# Patient Record
Sex: Female | Born: 1985 | Race: Black or African American | Hispanic: No | Marital: Single | Smoking: Never smoker
Health system: Southern US, Community
[De-identification: ages and names within clinical notes are randomized; demographics above are authoritative.]

## PROBLEM LIST (undated history)

## (undated) ENCOUNTER — Inpatient Hospital Stay (HOSPITAL_COMMUNITY): Payer: Self-pay

## (undated) DIAGNOSIS — K429 Umbilical hernia without obstruction or gangrene: Secondary | ICD-10-CM

## (undated) DIAGNOSIS — E119 Type 2 diabetes mellitus without complications: Secondary | ICD-10-CM

## (undated) DIAGNOSIS — B9689 Other specified bacterial agents as the cause of diseases classified elsewhere: Secondary | ICD-10-CM

## (undated) DIAGNOSIS — B379 Candidiasis, unspecified: Secondary | ICD-10-CM

## (undated) DIAGNOSIS — O24419 Gestational diabetes mellitus in pregnancy, unspecified control: Secondary | ICD-10-CM

## (undated) DIAGNOSIS — O1490 Unspecified pre-eclampsia, unspecified trimester: Secondary | ICD-10-CM

## (undated) DIAGNOSIS — N760 Acute vaginitis: Secondary | ICD-10-CM

## (undated) HISTORY — DX: Umbilical hernia without obstruction or gangrene: K42.9

## (undated) HISTORY — PX: TUBAL LIGATION: SHX77

## (undated) HISTORY — DX: Unspecified pre-eclampsia, unspecified trimester: O14.90

---

## 2011-03-19 ENCOUNTER — Inpatient Hospital Stay (HOSPITAL_COMMUNITY)
Admission: AD | Admit: 2011-03-19 | Discharge: 2011-03-19 | Disposition: A | Discharge status: Home / Self Care | Payer: Medicaid Other | Source: Ambulatory Visit | Attending: Obstetrics | Admitting: Obstetrics

## 2011-03-19 DIAGNOSIS — O368190 Decreased fetal movements, unspecified trimester, not applicable or unspecified: Secondary | ICD-10-CM | POA: Insufficient documentation

## 2011-03-20 ENCOUNTER — Inpatient Hospital Stay (HOSPITAL_COMMUNITY)
Admission: AD | Admit: 2011-03-20 | Discharge: 2011-03-23 | DRG: 775 | Disposition: A | Discharge status: Home / Self Care | Payer: Medicaid Other | Source: Ambulatory Visit | Attending: Obstetrics | Admitting: Obstetrics

## 2011-03-20 LAB — CBC
HCT: 39.1 % (ref 36.0–46.0)
MCH: 27.7 pg (ref 26.0–34.0)
MCHC: 34.5 g/dL (ref 30.0–36.0)
MCV: 80.1 fL (ref 78.0–100.0)
Platelets: 250 10*3/uL (ref 150–400)
RDW: 13.7 % (ref 11.5–15.5)

## 2011-03-21 ENCOUNTER — Inpatient Hospital Stay (HOSPITAL_COMMUNITY): Admission: AD | Admit: 2011-03-21 | Payer: Self-pay | Admitting: Obstetrics and Gynecology

## 2011-03-21 NOTE — H&P (Signed)
  NAME:  Marcia Bryant, Marcia Bryant        ACCOUNT NO.:  [id number]  MEDICAL RECORD NO.:  [id number]           PATIENT TYPE:  I  LOCATION:  9167                          FACILITY:  [facility]  PHYSICIAN:  Roseanna Rainbow, M.D.DATE OF BIRTH:  09-21-1986  DATE OF ADMISSION:  03/20/2011 DATE OF DISCHARGE:                             HISTORY & PHYSICAL   CHIEF COMPLAINTS:  The patient is a 25 year old para 0 with an estimated date of confinement by ultrasound of Mar 30, 2011, complaining of spontaneous rupture of membranes.  HISTORY OF PRESENT ILLNESS:  The patient reports rupture of membranes for clear fluid.  She reports mild uterine contractions.  SOCIAL HISTORY:  She is married.  She denies any tobacco, ethanol, or drug use.  She works as a Financial controller.  ALLERGIES:  No known drug allergies.  PAST GYN HISTORY:  [facility] 13, 60 and 4.  PAST MEDICAL HISTORY:  She denies.  PAST SURGICAL HISTORY:  She denies.  FAMILY HISTORY:  Noncontributory.  ANTEPARTUM COURSE: Onset of care at [facility] transferred care to Dr. Gaynell Face at 20 weeks. Prenatal labs: hemoglobin 13, hematocrit 38.9, platelets 249,000, blood type O+, antibody screen negative.  Sickle cell trait negative.  RPR nonreactive, rubella immune.  Hepatitis B surface antigen negative, HIV nonreactive.  Pap test negative.  GC probe negative.  Chlamydia probe negative.  Quad screen negative.  1-hour GTT 109, GBS negative on April 9.  Ultrasound on December 19, 2010, at 24 weeks 4 days no previa.  REVIEW OF SYSTEMS:  GU:  Please see the above.  PHYSICAL EXAMINATION:  VITAL SIGNS:  Stable, afebrile.  Fetal heart tracing baseline 140, moderate long-term variability.  Tocodynamometer irregular uterine contractions.  Sterile vaginal exam per the RN.  ASSESSMENT:  Nullipara at term, spontaneous rupture of membranes. Category one fetal heart tracing.  GBS negative.  No signs or symptoms of chorioamnionitis.  PLAN:  Admission.   Augmentation of labor with oral Cytotec.  Monitor progress.  Anticipate spontaneous vaginal delivery.  Monitor for any signs or symptoms of chorioamnionitis.     Roseanna Rainbow, M.D.     Judee Clara  D:  03/20/2011  T:  03/21/2011  Job:  [id number]  cc:   Kathreen Cosier, M.D. Fax: [phone]  Electronically Signed by Antionette Char M.D. on 03/21/2011 12:26:08 PM

## 2011-03-22 LAB — CBC
MCH: 27 pg (ref 26.0–34.0)
MCHC: 33.5 g/dL (ref 30.0–36.0)
MCV: 80.6 fL (ref 78.0–100.0)
Platelets: 203 10*3/uL (ref 150–400)
RBC: 4.07 MIL/uL (ref 3.87–5.11)
RDW: 14 % (ref 11.5–15.5)

## 2012-02-09 ENCOUNTER — Inpatient Hospital Stay (HOSPITAL_COMMUNITY): Payer: PRIVATE HEALTH INSURANCE

## 2012-02-09 ENCOUNTER — Encounter (HOSPITAL_COMMUNITY): Payer: Self-pay | Admitting: *Deleted

## 2012-02-09 ENCOUNTER — Inpatient Hospital Stay (HOSPITAL_COMMUNITY)
Admission: AD | Admit: 2012-02-09 | Discharge: 2012-02-09 | Disposition: A | Discharge status: Home / Self Care | Payer: PRIVATE HEALTH INSURANCE | Attending: Obstetrics & Gynecology | Admitting: Obstetrics & Gynecology

## 2012-02-09 DIAGNOSIS — O209 Hemorrhage in early pregnancy, unspecified: Secondary | ICD-10-CM

## 2012-02-09 DIAGNOSIS — O200 Threatened abortion: Secondary | ICD-10-CM | POA: Insufficient documentation

## 2012-02-09 LAB — WET PREP, GENITAL: Yeast Wet Prep HPF POC: NONE SEEN

## 2012-02-09 LAB — HCG, QUANTITATIVE, PREGNANCY: hCG, Beta Chain, Quant, S: 249 m[IU]/mL — ABNORMAL HIGH (ref <5)

## 2012-02-09 NOTE — MAU Provider Note (Signed)
  History     CSN: [id number]  Arrival date and time: 02/09/12 0335   First Provider Initiated Contact with Patient 02/09/12 0436      No chief complaint on file.  HPI This is a 26 y.o. at Unknown GA who presents with onset of bleeding this evening, which worsened later tonight. Denies cramping. States had a Quant HCG done at other clinic on 01/28/12 which was 107.  LMP in February. Has long history of missing periods.   OB History    Grav Para Term Preterm Abortions TAB SAB Ect Mult Living   2 1 1       1       History reviewed. No pertinent past medical history.  History reviewed. No pertinent past surgical history.  Family History  Problem Relation Age of Onset  . Hypertension Father     History  Substance Use Topics  . Smoking status: Never Smoker   . Smokeless tobacco: Not on file  . Alcohol Use: No    Allergies: No Known Allergies  No prescriptions prior to admission    ROS As described above  Physical Exam   Blood pressure 109/59, pulse 62, temperature 98.9 F (37.2 C), temperature source Oral, resp. rate 20, height 5\' 5"  (1.651 m), weight 183 lb (83.008 kg), last menstrual period 12/15/2011.  Physical Exam  Constitutional: She appears well-developed and well-nourished. No distress.  HENT:  Head: Normocephalic.  Cardiovascular: Normal rate.   Respiratory: Effort normal.  GI: Soft. She exhibits no distension and no mass. There is no tenderness. There is no rebound and no guarding.  Genitourinary: Uterus normal. Vaginal discharge (Moderate dark red blood in vault. Cervix long and closed) found.       Uterus small, nontender. Adnexae nontender  Musculoskeletal: Normal range of motion.  Neurological: She is alert.  Skin: Skin is warm and dry.  Psychiatric: She has a normal mood and affect.   Blood Type O+  MAU Course  Procedures  MDM Will repeat Quant HCG.  Would expect it to be about 6400 by now, based on doubling every 48 hrs, or 2233 by the 66%  method.  >>>  Quant = 249  (was 107 on 01/28/12)  WU:[id number] gestational sac: Endometrial stripe thickness is  moderately prominent at 1.1 cm. Small cystic collection in the mid  endometrial region could represent early intrauterine pregnancy.  Small subserosal leiomyoma measuring 0.8 x 0.6 x 0.9 cm in the  anterior upper uterus.  Yolk sac: Not visualized  Embryo: Not visualized  Cardiac Activity: Not visualized  Heart Rate: N/A bpm  MSD: 4.5 mm 4 w 3 d  Maternal uterus/adnexae:  The right ovary contains a simple appearing cyst measuring 5 x 3.4  x 3.9 cm no flow or nodularity is demonstrated. This may represent  a corpus luteal cyst or functional cyst. Left ovary is  unremarkable. No free pelvic fluid collections.  IMPRESSION:  Small nonspecific cystic collection in the endometrium may  represent early intrauterine gestational sac, measuring about 4  weeks 3 days gestational age. Differential diagnosis includes  blood clot from missed spontaneous abortion. Recommend follow-up  with quantitative beta HCG levels and short-term ultrasound in 1-2  weeks.  Assessment and Plan  A:  Probable SAB in progress P:  Return in 48 hrs to repeat Quant, then may need to go to once weekly      Bleeding Precautions  [facility] 02/09/2012, 4:40 AM

## 2012-02-09 NOTE — MAU Provider Note (Signed)
Attestation of Attending Supervision of Advanced Practitioner: Evaluation and management procedures were performed by the PA/NP/CNM/OB Fellow under my supervision/collaboration. Chart reviewed, and agree with management and plan.  Jaynie Collins, M.D. 02/09/2012 8:46 AM

## 2012-02-09 NOTE — MAU Note (Signed)
D/C HOME WITH SMALL AMT BLEEDING- TO RETURN ON Thursday FOR LABS

## 2012-02-09 NOTE — MAU Note (Signed)
PT SAYS [facility] HAD NL CYCLE IN FEB.   NO CYCLE IN [facility]  OR April.     NO BIRTH CONTROL.   DID HOME PREG TEST  POSTIVE  3-25.   WENT TO URGENT CARE ON [place] ON 3-21-    HAS PAPERS IN TRIAGE-  QUANT WAS  107.Marland Kitchen   SAYS VAG BLEEDING  STARTED Monday NIGHT  AT   8PM- SPOTTING.  THEN AT [place]- WAS MORE RED BLOOD.   HAS ON MIMI -PAD IN TRIAGE - FEELS LIKE SMALL AMT ON PAD .   DENIES PAIN NOR CRAMPING.    LAST SEX-  Friday..  DR MARSHALL DELIVERED LAST BABY-   PLANS TO GET PNC THERE THIS TIME.

## 2012-02-09 NOTE — Discharge Instructions (Signed)
Vaginal Bleeding During Pregnancy, First Trimester  A small amount of bleeding (spotting) is relatively common in early pregnancy. It usually stops on its own. There are many causes for bleeding or spotting in early pregnancy. Some bleeding may be related to the pregnancy and some may not. Cramping with the bleeding is more serious and concerning. Tell your caregiver if you have any vaginal bleeding.   CAUSES    It is normal in most cases.   The pregnancy ends (miscarriage).   The pregnancy may end (threatened miscarriage).   Infection or inflammation of the cervix.   Growths (polyps) on the cervix.   Pregnancy happens outside of the uterus and in a fallopian tube (tubal pregnancy).   Many tiny cysts in the uterus instead of pregnancy tissue (molar pregnancy).  SYMPTOMS   Vaginal bleeding or spotting with or without cramps.  DIAGNOSIS   To evaluate the pregnancy, your caregiver may:   Do a pelvic exam.   Take blood tests.   Do an ultrasound.  It is very important to follow your caregiver's instructions.   TREATMENT    Evaluation of the pregnancy with blood tests and ultrasound.   Bed rest (getting up to use the bathroom only).   Rho-gam immunization if the mother is Rh negative and the father is Rh positive.  HOME CARE INSTRUCTIONS    If your caregiver orders bed rest, you may need to make arrangements for the care of other children and for other responsibilities. However, your caregiver may allow you to continue light activity.   Keep track of the number of pads you use each day, how often you change pads and how soaked (saturated) they are. Write this down.   Do not use tampons. Do not douche.   Do not have sexual intercourse or orgasms until approved by your physician.   Save any tissue that you pass for your caregiver to see.   Take medicine for cramps only with your caregiver's permission.   Do not take aspirin because it can make you bleed.  SEEK IMMEDIATE MEDICAL CARE IF:    You  experience severe cramps in your stomach, back or belly (abdomen).   You have an oral temperature above 102 F (38.9 C), not controlled by medicine.   You pass large clots or tissue.   Your bleeding increases or you become light-headed, weak or have fainting episodes.   You develop chills.   You are leaking or have a gush of fluid from your vagina.   You pass out while having a bowel movement. That may mean you have a ruptured tubal pregnancy.  Document Released: 08/05/2005 Document Revised: 10/15/2011 Document Reviewed: 02/14/2009  Exit[employer] Patient Information 2012 [employer].

## 2012-02-09 NOTE — MAU Note (Signed)
PAD ON IN ROOM-  RED SMEARS.

## 2012-02-11 ENCOUNTER — Inpatient Hospital Stay (HOSPITAL_COMMUNITY)
Admission: AD | Admit: 2012-02-11 | Discharge: 2012-02-11 | Disposition: A | Discharge status: Home / Self Care | Payer: Medicaid Other | Source: Ambulatory Visit | Attending: Obstetrics & Gynecology | Admitting: Obstetrics & Gynecology

## 2012-02-11 ENCOUNTER — Encounter (HOSPITAL_COMMUNITY): Payer: Self-pay

## 2012-02-11 DIAGNOSIS — O039 Complete or unspecified spontaneous abortion without complication: Secondary | ICD-10-CM | POA: Insufficient documentation

## 2012-02-11 LAB — HCG, QUANTITATIVE, PREGNANCY: hCG, Beta Chain, Quant, S: 29 m[IU]/mL — ABNORMAL HIGH (ref <5)

## 2012-02-11 NOTE — MAU Note (Signed)
Patient to MAU for f/u BHCG. Denies any pain, has a little spotting.

## 2012-02-11 NOTE — MAU Provider Note (Signed)
Marcia Bryant is a 26 y.o. female who presents to MAU for follow up Bhcg. Was was evaluated 2 days ago with bleeding and cramping and the Bhcg was 249 and ultrasound showed a IUGS that measured 4.[redacted] weeks gestation with no YS or FP. Patient has little bleeding and cramping today. Bhcg today has dropped to 29. Will have patient follow up in GYN Clinic in 2 weeks. She will return here as needed for any problems.   Results for orders placed during the hospital encounter of 02/11/12 (from the past 24 hour(s))  HCG, QUANTITATIVE, PREGNANCY     Status: Abnormal   Collection Time   02/11/12  4:54 PM      Component Value Range   hCG, Beta Chain, Quant, S 29 (*) <5 (mIU/mL)

## 2012-02-11 NOTE — Discharge Instructions (Signed)
Your pregnancy hormone level today has dropped to 29 from 249 two days ago. You will need to follow up in the GYN Clinic in 2 weeks. Someone will call you to schedule an appointment. If you need to call them the number is [phone].   Miscarriage An early pregnancy loss or spontaneous abortion (miscarriage) is a common problem. This usually happens when the pregnancy is not developing normally. It is very unlikely that you or your partner did anything to cause this, although cigarette smoking, a sexually transmitted disease, excessive alcohol use, or drug abuse can increase the risk. Other causes are:  Abnormalities of the uterus.   Hormone or medical problems.   Trauma or genetic (chromosome) problems.  Having a miscarriage does not change your chances of having a normal pregnancy in the future. Your caregiver will advise you when it is safe to try to get pregnant again. AFTER A MISCARRIAGE  A miscarriage is inevitable when there is continual, heavy vaginal bleeding; cramping; dilation of the cervix; or passing of any pregnancy tissue. Bleeding and cramping will usually continue until all the tissue has been removed from the womb (uterus).   Often the uterus does not clean itself out completely. A medication or a D&C procedure is needed to loosen or remove the pregnancy tissue from the uterus. A D&C scrapes or suctions the tissue out.   If you are RH negative, you may need to have Rh immune globulin to avoid Rh problems.   You may be given medication to fight an infection if the miscarriage was due to an infection.  HOME CARE INSTRUCTIONS   You should rest in bed for the next 2 to 3 days.   Do not take tub baths or put anything in your vagina, including tampons or a douche.   Do not have sex until your caregiver approves.   Avoid exercise or heavy activities until directed by your caregiver.   Save any vaginal discharge that looks like tissue. Ask your caregiver if he or she wants to  inspect the discharge.   If you and your partner are having problems with guilt or grieving, talk to your caregiver or get counseling to help you understand and cope with your pregnancy loss.   Allow enough time to grieve before trying to get pregnant again.  SEEK IMMEDIATE MEDICAL CARE IF:   You have persistent heavy bleeding or a bad smelling vaginal discharge.   You have continued abdominal or pelvic pain.   You have an oral temperature above 102 F (38.9 C), not controlled by medicine.   You have severe weakness, fainting, or keep throwing up (vomiting).   You develop chills.   You are experiencing domestic violence.  MAKE SURE YOU:   Understand these instructions.   Will watch your condition.   Will get help right away if you are not doing well or get worse.  Document Released: 12/03/2004 Document Revised: 10/15/2011 Document Reviewed: 10/18/2008 [facility] Patient Information 2012 [place], [place].

## 2012-02-20 ENCOUNTER — Inpatient Hospital Stay (HOSPITAL_COMMUNITY)
Admission: AD | Admit: 2012-02-20 | Discharge: 2012-02-20 | Disposition: A | Discharge status: Home / Self Care | Payer: PRIVATE HEALTH INSURANCE | Source: Ambulatory Visit | Attending: Obstetrics & Gynecology | Admitting: Obstetrics & Gynecology

## 2012-02-20 ENCOUNTER — Encounter (HOSPITAL_COMMUNITY): Payer: Self-pay

## 2012-02-20 DIAGNOSIS — R51 Headache: Secondary | ICD-10-CM | POA: Insufficient documentation

## 2012-02-20 DIAGNOSIS — B349 Viral infection, unspecified: Secondary | ICD-10-CM

## 2012-02-20 DIAGNOSIS — B9789 Other viral agents as the cause of diseases classified elsewhere: Secondary | ICD-10-CM | POA: Insufficient documentation

## 2012-02-20 LAB — URINALYSIS, ROUTINE W REFLEX MICROSCOPIC
Protein, ur: NEGATIVE mg/dL
Specific Gravity, Urine: <1.005 — ABNORMAL LOW (ref 1.005–1.030)
Urobilinogen, UA: 2 mg/dL — ABNORMAL HIGH (ref 0.0–1.0)

## 2012-02-20 LAB — DIFFERENTIAL
Basophils Relative: 0 % (ref 0–1)
Eosinophils Absolute: 0 10*3/uL (ref 0.0–0.7)
Lymphs Abs: 2.5 10*3/uL (ref 0.7–4.0)
Neutro Abs: 16.6 10*3/uL — ABNORMAL HIGH (ref 1.7–7.7)
Neutrophils Relative %: 78 % — ABNORMAL HIGH (ref 43–77)

## 2012-02-20 LAB — CBC
MCH: 26.6 pg (ref 26.0–34.0)
MCHC: 33.5 g/dL (ref 30.0–36.0)
Platelets: 265 10*3/uL (ref 150–400)
RBC: 4.93 MIL/uL (ref 3.87–5.11)

## 2012-02-20 LAB — URINE MICROSCOPIC-ADD ON

## 2012-02-20 LAB — HCG, QUANTITATIVE, PREGNANCY: hCG, Beta Chain, Quant, S: <1 m[IU]/mL (ref <5)

## 2012-02-20 MED ORDER — ACETAMINOPHEN 500 MG PO TABS
1000.0000 mg | ORAL_TABLET | Freq: Once | ORAL | Status: AC
  Administered 2012-02-20: 1000 mg via ORAL
  Filled 2012-02-20: qty 2

## 2012-02-20 MED ORDER — IBUPROFEN 800 MG PO TABS
800.0000 mg | ORAL_TABLET | Freq: Once | ORAL | Status: AC
  Administered 2012-02-20: 800 mg via ORAL
  Filled 2012-02-20: qty 1

## 2012-02-20 NOTE — MAU Note (Signed)
Patient is here with c/o last night onset of body ache, sore throat, headache without relief from tylenol 1000mg  (last dose this morning), and chills. She states that she had a miscarriage 2 weeks and is to f/u here next week for bhcg. She denies any n/v, vaginal bleeding or discharge.

## 2012-02-20 NOTE — Discharge Instructions (Signed)
Viral Syndrome You or your child has Viral Syndrome. It is the most common infection causing "colds" and infections in the nose, throat, sinuses, and breathing tubes. Sometimes the infection causes nausea, vomiting, or diarrhea. The germ that causes the infection is a virus. No antibiotic or other medicine will kill it. There are medicines that you can take to make you or your child more comfortable.  HOME CARE INSTRUCTIONS   Rest in bed until you start to feel better.   If you have diarrhea or vomiting, eat small amounts of crackers and toast. Soup is helpful.   Do not give aspirin or medicine that contains aspirin to children.   Only take over-the-counter or prescription medicines for pain, discomfort, or fever as directed by your caregiver.  SEEK IMMEDIATE MEDICAL CARE IF:   You or your child has not improved within one week.   You or your child has pain that is not at least partially relieved by over-the-counter medicine.   Thick, colored mucus or blood is coughed up.   Discharge from the nose becomes thick yellow or green.   Diarrhea or vomiting gets worse.   There is any major change in your or your child's condition.   You or your child develops a skin rash, stiff neck, severe headache, or are unable to hold down food or fluid.   You or your child has an oral temperature above 102 F (38.9 C), not controlled by medicine.   Your baby is older than 3 months with a rectal temperature of 102 F (38.9 C) or higher.   Your baby is 3 months old or younger with a rectal temperature of 100.4 F (38 C) or higher.  Document Released: 10/11/2006 Document Revised: 10/15/2011 Document Reviewed: 10/12/2007 [employer] Patient Information 2012 [employer]. 

## 2012-02-20 NOTE — MAU Provider Note (Signed)
History   Pt presents today c/o "feeling cold", having HAs, and achy all over. She states her sx began yesterday. She has taken tylenol at home but has not checked her temperature. She denies abd pain, vag dc, bleeding, diarrhea, cough, or any other sx at this time.  CSN: [id number]  Arrival date and time: 02/20/12 1944   First Provider Initiated Contact with Patient 02/20/12 2007      Chief Complaint  Patient presents with  . Headache  . Fever  . Chills  . Generalized Body Aches   HPI  OB History    Grav Para Term Preterm Abortions TAB SAB Ect Mult Living   2 1 1  1  1   1       History reviewed. No pertinent past medical history.  History reviewed. No pertinent past surgical history.  Family History  Problem Relation Age of Onset  . Hypertension Father     History  Substance Use Topics  . Smoking status: Never Smoker   . Smokeless tobacco: Not on file  . Alcohol Use: No    Allergies: No Known Allergies  Prescriptions prior to admission  Medication Sig Dispense Refill  . acetaminophen (TYLENOL) 325 MG tablet Take 650 mg by mouth every 6 (six) hours as needed. For pain        Review of Systems  Constitutional: Positive for fever, chills and malaise/fatigue.  Eyes: Negative for blurred vision and double vision.  Respiratory: Negative for cough, hemoptysis, sputum production, shortness of breath and wheezing.   Cardiovascular: Negative for chest pain and palpitations.  Gastrointestinal: Negative for nausea, vomiting, abdominal pain, diarrhea and constipation.  Genitourinary: Negative for dysuria, urgency, frequency and hematuria.  Skin: Negative for itching and rash.  Neurological: Positive for headaches. Negative for dizziness.  Psychiatric/Behavioral: Negative for depression and suicidal ideas.   Physical Exam   Blood pressure 108/89, pulse 114, temperature 103 F (39.4 C), temperature source Oral, resp. rate 20, height 5\' 5"  (1.651 m), weight 178 lb 2 oz  (80.797 kg), last menstrual period 12/15/2011, SpO2 100.00%, unknown if currently breastfeeding.  Physical Exam  Nursing note and vitals reviewed. Constitutional: She is oriented to person, place, and time. She appears well-developed and well-nourished. No distress.  HENT:  Head: Normocephalic and atraumatic.  Eyes: EOM are normal. Pupils are equal, round, and reactive to light.  Cardiovascular: Normal rate, regular rhythm and normal heart sounds.  Exam reveals no gallop and no friction rub.   No murmur heard. Respiratory: Effort normal and breath sounds normal. No respiratory distress. She has no wheezes. She has no rales. She exhibits no tenderness.  GI: Soft. She exhibits no distension. There is no tenderness. There is no rebound and no guarding.  Neurological: She is alert and oriented to person, place, and time.  Skin: Skin is warm and dry. She is not diaphoretic.  Psychiatric: She has a normal mood and affect. Her behavior is normal. Judgment and thought content normal.    MAU Course  Procedures  Results for orders placed during the hospital encounter of 02/20/12 (from the past 24 hour(s))  HCG, QUANTITATIVE, PREGNANCY     Status: Normal   Collection Time   02/20/12  8:10 PM      Component Value Range   hCG, Beta Chain, Quant, S <1  <5 (mIU/mL)  CBC     Status: Abnormal   Collection Time   02/20/12  8:30 PM      Component Value Range  WBC 21.3 (*) 4.0 - 10.5 (K/uL)   RBC 4.93  3.87 - 5.11 (MIL/uL)   Hemoglobin 13.1  12.0 - 15.0 (g/dL)   HCT 16.1  09.6 - 04.5 (%)   MCV 79.3  78.0 - 100.0 (fL)   MCH 26.6  26.0 - 34.0 (pg)   MCHC 33.5  30.0 - 36.0 (g/dL)   RDW 40.9  81.1 - 91.4 (%)   Platelets 265  150 - 400 (K/uL)  DIFFERENTIAL     Status: Abnormal   Collection Time   02/20/12  8:30 PM      Component Value Range   Neutrophils Relative 78 (*) 43 - 77 (%)   Neutro Abs 16.6 (*) 1.7 - 7.7 (K/uL)   Lymphocytes Relative 12  12 - 46 (%)   Lymphs Abs 2.5  0.7 - 4.0 (K/uL)    Monocytes Relative 10  3 - 12 (%)   Monocytes Absolute 2.2 (*) 0.1 - 1.0 (K/uL)   Eosinophils Relative 0  0 - 5 (%)   Eosinophils Absolute 0.0  0.0 - 0.7 (K/uL)   Basophils Relative 0  0 - 1 (%)   Basophils Absolute 0.0  0.0 - 0.1 (K/uL)  URINALYSIS, ROUTINE W REFLEX MICROSCOPIC     Status: Abnormal   Collection Time   02/20/12  9:12 PM      Component Value Range   Color, Urine YELLOW  YELLOW    APPearance CLEAR  CLEAR    Specific Gravity, Urine <1.005 (*) 1.005 - 1.030    pH 7.0  5.0 - 8.0    Glucose, UA NEGATIVE  NEGATIVE (mg/dL)   Hgb urine dipstick NEGATIVE  NEGATIVE    Bilirubin Urine NEGATIVE  NEGATIVE    Ketones, ur NEGATIVE  NEGATIVE (mg/dL)   Protein, ur NEGATIVE  NEGATIVE (mg/dL)   Urobilinogen, UA 2.0 (*) 0.0 - 1.0 (mg/dL)   Nitrite NEGATIVE  NEGATIVE    Leukocytes, UA SMALL (*) NEGATIVE   URINE MICROSCOPIC-ADD ON     Status: Abnormal   Collection Time   02/20/12  9:12 PM      Component Value Range   Squamous Epithelial / LPF FEW (*) RARE    WBC, UA 3-6  <3 (WBC/hpf)   RBC / HPF 3-6  <3 (RBC/hpf)   Bacteria, UA FEW (*) RARE    Temp and HA has resolved following po motrin and tylenol.   Discussed pt with Dr. Debroah Loop. Ok to dc.  Assessment and Plan  Viral syndrome: discussed with pt length. She is to f/u with her PCP. Discussed diet, activity, risks, and precautions. Pt B-quant now <1. She does not need to f/u next week in the MAU for SAB.  Clinton Gallant. Aniel Hubble III, DrHSc, MPAS, PA-C  02/20/2012, 8:23 PM

## 2012-02-22 LAB — URINE CULTURE: Colony Count: 55000

## 2012-02-26 ENCOUNTER — Other Ambulatory Visit: Payer: Medicaid Other

## 2012-06-09 ENCOUNTER — Inpatient Hospital Stay (HOSPITAL_COMMUNITY)
Admission: AD | Admit: 2012-06-09 | Discharge: 2012-06-09 | Disposition: A | Discharge status: Home / Self Care | Payer: PRIVATE HEALTH INSURANCE | Source: Ambulatory Visit | Attending: Obstetrics & Gynecology | Admitting: Obstetrics & Gynecology

## 2012-06-09 ENCOUNTER — Encounter (HOSPITAL_COMMUNITY): Payer: Self-pay

## 2012-06-09 DIAGNOSIS — Z3201 Encounter for pregnancy test, result positive: Secondary | ICD-10-CM | POA: Insufficient documentation

## 2012-06-09 LAB — POCT PREGNANCY, URINE: Preg Test, Ur: POSITIVE — AB

## 2012-06-09 NOTE — Progress Notes (Signed)
S:  Pt here for pregnancy test only, denies pain or bleeding. States needs pregnancy verification letter only for MCD. O:  A&OX3  Filed Vitals:   06/09/12 1144  BP: 114/62  Pulse: 79  Temp: 98.2 F (36.8 C)  Resp: 18   A:  Pregnancy - Confirmed  P:  Begin prenatal care [facility]

## 2012-06-09 NOTE — MAU Note (Signed)
Pt here for pregnancy test only, denies pain or bleeding. States needs pregnancy verification letter only for MCD.

## 2012-07-23 ENCOUNTER — Inpatient Hospital Stay (HOSPITAL_COMMUNITY)
Admission: AD | Admit: 2012-07-23 | Discharge: 2012-07-23 | Disposition: A | Discharge status: Home / Self Care | Payer: PRIVATE HEALTH INSURANCE | Source: Ambulatory Visit | Attending: Obstetrics & Gynecology | Admitting: Obstetrics & Gynecology

## 2012-07-23 ENCOUNTER — Encounter (HOSPITAL_COMMUNITY): Payer: Self-pay

## 2012-07-23 ENCOUNTER — Inpatient Hospital Stay (HOSPITAL_COMMUNITY): Payer: PRIVATE HEALTH INSURANCE

## 2012-07-23 DIAGNOSIS — K3189 Other diseases of stomach and duodenum: Secondary | ICD-10-CM | POA: Insufficient documentation

## 2012-07-23 DIAGNOSIS — O99891 Other specified diseases and conditions complicating pregnancy: Secondary | ICD-10-CM | POA: Insufficient documentation

## 2012-07-23 DIAGNOSIS — IMO0001 Reserved for inherently not codable concepts without codable children: Secondary | ICD-10-CM

## 2012-07-23 DIAGNOSIS — R1032 Left lower quadrant pain: Secondary | ICD-10-CM | POA: Insufficient documentation

## 2012-07-23 DIAGNOSIS — M7918 Myalgia, other site: Secondary | ICD-10-CM

## 2012-07-23 DIAGNOSIS — R1013 Epigastric pain: Secondary | ICD-10-CM | POA: Insufficient documentation

## 2012-07-23 DIAGNOSIS — K30 Functional dyspepsia: Secondary | ICD-10-CM

## 2012-07-23 LAB — CBC
HCT: 39.4 % (ref 36.0–46.0)
Hemoglobin: 13.3 g/dL (ref 12.0–15.0)
RBC: 5.01 MIL/uL (ref 3.87–5.11)
WBC: 10.2 10*3/uL (ref 4.0–10.5)

## 2012-07-23 LAB — URINALYSIS, ROUTINE W REFLEX MICROSCOPIC
Bilirubin Urine: NEGATIVE
Hgb urine dipstick: NEGATIVE
Ketones, ur: NEGATIVE mg/dL
Nitrite: NEGATIVE
Specific Gravity, Urine: 1.025 (ref 1.005–1.030)
Urobilinogen, UA: 0.2 mg/dL (ref 0.0–1.0)

## 2012-07-23 LAB — COMPREHENSIVE METABOLIC PANEL
BUN: 7 mg/dL (ref 6–23)
CO2: 25 mEq/L (ref 19–32)
Calcium: 9.3 mg/dL (ref 8.4–10.5)
Creatinine, Ser: 0.67 mg/dL (ref 0.50–1.10)
GFR calc Af Amer: >90 mL/min (ref >90)
GFR calc non Af Amer: >90 mL/min (ref >90)
Glucose, Bld: 105 mg/dL — ABNORMAL HIGH (ref 70–99)
Sodium: 134 mEq/L — ABNORMAL LOW (ref 135–145)
Total Protein: 6.7 g/dL (ref 6.0–8.3)

## 2012-07-23 LAB — WET PREP, GENITAL: Yeast Wet Prep HPF POC: NONE SEEN

## 2012-07-23 MED ORDER — PRENATAL MULTIVITAMIN CH: 1.0000 | ORAL_TABLET | Freq: Every day | ORAL | Status: DC

## 2012-07-23 MED ORDER — RANITIDINE HCL 150 MG PO TABS: 150.0000 mg | ORAL_TABLET | Freq: Two times a day (BID) | ORAL | Status: DC

## 2012-07-23 MED ORDER — FAMOTIDINE 20 MG PO TABS
20.0000 mg | ORAL_TABLET | ORAL | Status: AC
  Administered 2012-07-23: 20 mg via ORAL
  Filled 2012-07-23: qty 1

## 2012-07-23 NOTE — MAU Provider Note (Signed)
Chief Complaint: No chief complaint on file.   None    SUBJECTIVE HPI: Marcia Bryant is a 26 y.o. G3P1011 at [redacted]w[redacted]d by LMP who presents to maternity admissions reporting left mid abdomen to left lower abdomen pain that is described as constant, which started yesterday.  The pain is aggravated by position changes.  She has not yet had prenatal care in this pregnancy but has an appointment next month with [facility].  Patient's last menstrual period was 04/20/2012. She denies LOF, vaginal bleeding, vaginal itching/burning, urinary symptoms, h/a, dizziness, n/v, or fever/chills.    No past medical history on file. No past surgical history on file. History   Social History  . Marital Status: Married    Spouse Name: N/A    Number of Children: N/A  . Years of Education: N/A   Occupational History  . Not on file.   Social History Main Topics  . Smoking status: Never Smoker   . Smokeless tobacco: Never Used  . Alcohol Use: No  . Drug Use: No  . Sexually Active: Yes   Other Topics Concern  . Not on file   Social History Narrative  . No narrative on file   No current facility-administered medications on file prior to encounter.   Current Outpatient Prescriptions on File Prior to Encounter  Medication Sig Dispense Refill  . acetaminophen (TYLENOL) 325 MG tablet Take 650 mg by mouth every 6 (six) hours as needed. For pain      . Prenatal Vit-Fe Fumarate-FA (PRENATAL MULTIVITAMIN) TABS Take 1 tablet by mouth daily.       No Known Allergies  ROS: Pertinent items in HPI  OBJECTIVE Blood pressure 110/62, pulse 90, temperature 98 F (36.7 C), temperature source Oral, resp. rate 18, last menstrual period 04/20/2012. GENERAL: Well-developed, well-nourished female in no acute distress.  HEENT: Normocephalic HEART: normal rate RESP: normal effort ABDOMEN: Soft, non-tender, fundal height at pubic symphysis EXTREMITIES: Nontender, no edema NEURO: Alert and oriented Pelvic exam: Cervix  pink, visually closed, without lesion, scant white creamy discharge, vaginal walls and external genitalia normal Bimanual exam: Cervix 0/long/high, firm, anterior, neg CMT, uterus nontender, ~12 week size, adnexa without tenderness, enlargement, or mass  LAB RESULTS Results for orders placed during the hospital encounter of 07/23/12 (from the past 48 hour(s))  URINALYSIS, ROUTINE W REFLEX MICROSCOPIC     Status: Normal   Collection Time   07/23/12  4:18 AM      Component Value Range Comment   Color, Urine YELLOW  YELLOW    APPearance CLEAR  CLEAR    Specific Gravity, Urine 1.025  1.005 - 1.030    pH 6.0  5.0 - 8.0    Glucose, UA NEGATIVE  NEGATIVE mg/dL    Hgb urine dipstick NEGATIVE  NEGATIVE    Bilirubin Urine NEGATIVE  NEGATIVE    Ketones, ur NEGATIVE  NEGATIVE mg/dL    Protein, ur NEGATIVE  NEGATIVE mg/dL    Urobilinogen, UA 0.2  0.0 - 1.0 mg/dL    Nitrite NEGATIVE  NEGATIVE    Leukocytes, UA NEGATIVE  NEGATIVE MICROSCOPIC NOT DONE ON URINES WITH NEGATIVE PROTEIN, BLOOD, LEUKOCYTES, NITRITE, OR GLUCOSE <1000 mg/dL.  WET PREP, GENITAL     Status: Abnormal   Collection Time   07/23/12  4:51 AM      Component Value Range Comment   Yeast Wet Prep HPF POC NONE SEEN  NONE SEEN    Trich, Wet Prep NONE SEEN  NONE SEEN    Clue  Cells Wet Prep HPF POC FEW (*) NONE SEEN    WBC, Wet Prep HPF POC FEW (*) NONE SEEN MODERATE BACTERIA SEEN  COMPREHENSIVE METABOLIC PANEL     Status: Abnormal   Collection Time   07/23/12  4:59 AM      Component Value Range Comment   Sodium 134 (*) 135 - 145 mEq/L    Potassium 4.0  3.5 - 5.1 mEq/L    Chloride 101  96 - 112 mEq/L    CO2 25  19 - 32 mEq/L    Glucose, Bld 105 (*) 70 - 99 mg/dL    BUN 7  6 - 23 mg/dL    Creatinine, Ser 9.14  0.50 - 1.10 mg/dL    Calcium 9.3  8.4 - 78.2 mg/dL    Total Protein 6.7  6.0 - 8.3 g/dL    Albumin 3.4 (*) 3.5 - 5.2 g/dL    AST 14  0 - 37 U/L    ALT 12  0 - 35 U/L    Alkaline Phosphatase 70  39 - 117 U/L    Total  Bilirubin 0.6  0.3 - 1.2 mg/dL    GFR calc non Af Amer >90  >90 mL/min    GFR calc Af Amer >90  >90 mL/min   CBC     Status: Abnormal   Collection Time   07/23/12  4:59 AM      Component Value Range Comment   WBC 10.2  4.0 - 10.5 K/uL    RBC 5.01  3.87 - 5.11 MIL/uL    Hemoglobin 13.3  12.0 - 15.0 g/dL    HCT 95.6  21.3 - 08.6 %    MCV 78.6  78.0 - 100.0 fL    MCH 26.5  26.0 - 34.0 pg    MCHC 33.8  30.0 - 36.0 g/dL    RDW 57.8 (*) 46.9 - 15.5 %    Platelets 254  150 - 400 K/uL     IMAGING  US Ob Comp Less 14 Wks  07/23/2012  *RADIOLOGY REPORT*  Clinical Data: Left lower quadrant pain  OBSTETRIC <14 WK ULTRASOUND  Technique:  Transabdominal ultrasound was performed for evaluation of the gestation as well as the maternal uterus and adnexal regions.  Comparison:  None.  Intrauterine gestational sac: Visualized/normal in shape. Yolk sac: Identified Embryo: Identified Cardiac Activity: Identified Heart Rate: 158 bpm  CRL:  5.01 mm  11 w  6 d            [place] EDC: 02/05/2013  Maternal uterus/Adnexae: No subchorionic hemorrhage.  Normal sonographic appearance to the ovaries.  Corpus luteal cyst on the right.  No free fluid.  IMPRESSION: Single intrauterine gestation with cardiac activity documented. Estimated age of 11-week 6 days by crown-rump length.   Original Report Authenticated By: Waneta Martins, M.D.     ASSESSMENT 1. Musculoskeletal pain   2. Acid indigestion     PLAN Zantac 150 mg PO x1 given in MAU with significant improvement in symptoms Discharge home Zantac 150 mg BID  Rest, drink plenty of fluids, heating pad/warm bath for pain Reassurance that pain is above her uterus and not related to pregnancy F/U with [facility] as scheduled in October Return to MAU as needed    Medication List     As of 07/23/2012  4:23 AM    ASK your doctor about these medications         acetaminophen 325 MG tablet   Commonly known  as: TYLENOL   Take 650 mg by mouth every 6 (six) hours as  needed. For pain      prenatal multivitamin Tabs   Take 1 tablet by mouth daily.         Sharen Counter Certified Nurse-Midwife 07/23/2012  4:23 AM

## 2012-07-23 NOTE — MAU Note (Signed)
Patient is in with sudden onset of llq pain. She denies any vaginal bleeding or discharge. She states she have an appt with [facility] but have seen any provider for this pregnancy.

## 2012-07-25 LAB — GC/CHLAMYDIA PROBE AMP, GENITAL
Chlamydia, DNA Probe: NEGATIVE
GC Probe Amp, Genital: NEGATIVE

## 2012-08-09 ENCOUNTER — Ambulatory Visit (INDEPENDENT_AMBULATORY_CARE_PROVIDER_SITE_OTHER): Payer: Medicaid Other | Admitting: Obstetrics and Gynecology

## 2012-08-09 DIAGNOSIS — Z331 Pregnant state, incidental: Secondary | ICD-10-CM

## 2012-08-10 LAB — PRENATAL PANEL VII
Antibody Screen: NEGATIVE
Basophils Relative: 0 % (ref 0–1)
Eosinophils Absolute: 0.1 10*3/uL (ref 0.0–0.7)
Eosinophils Relative: 1 % (ref 0–5)
Hemoglobin: 13 g/dL (ref 12.0–15.0)
Lymphs Abs: 3 10*3/uL (ref 0.7–4.0)
MCH: 26.3 pg (ref 26.0–34.0)
MCHC: 33.5 g/dL (ref 30.0–36.0)
MCV: 78.4 fL (ref 78.0–100.0)
Monocytes Relative: 6 % (ref 3–12)
Neutrophils Relative %: 65 % (ref 43–77)
Platelets: 330 10*3/uL (ref 150–400)
RBC: 4.95 MIL/uL (ref 3.87–5.11)
Rh Type: POSITIVE
Rubella: 150.8 IU/mL — ABNORMAL HIGH

## 2012-08-10 LAB — AFP, QUAD SCREEN
Age Alone: 1:1040 {titer}
Curr Gest Age: 14.2 wks.days
Down Syndrome Scr Risk Est: 1:32800 {titer}
INH: 124 pg/mL
MoM for INH: 0.67
MoM for hCG: 0.59
Osb Risk: 1:26600 {titer}
Tri 18 Scr Risk Est: NEGATIVE
Trisomy 18 (Edward) Syndrome Interp.: 1:31300 {titer}
uE3 Value: 0.3 ng/mL

## 2012-08-11 LAB — HEMOGLOBINOPATHY EVALUATION
Hgb A2 Quant: 2.7 % (ref 2.2–3.2)
Hgb A: 96.8 % (ref 96.8–97.8)

## 2012-08-12 ENCOUNTER — Ambulatory Visit (INDEPENDENT_AMBULATORY_CARE_PROVIDER_SITE_OTHER): Payer: Medicaid Other | Admitting: Obstetrics and Gynecology

## 2012-08-12 ENCOUNTER — Encounter: Payer: Self-pay | Admitting: Obstetrics and Gynecology

## 2012-08-12 VITALS — BP 100/62 | Wt 180.0 lb

## 2012-08-12 DIAGNOSIS — O26899 Other specified pregnancy related conditions, unspecified trimester: Secondary | ICD-10-CM

## 2012-08-12 DIAGNOSIS — Z363 Encounter for antenatal screening for malformations: Secondary | ICD-10-CM

## 2012-08-12 DIAGNOSIS — Z23 Encounter for immunization: Secondary | ICD-10-CM

## 2012-08-12 DIAGNOSIS — IMO0002 Reserved for concepts with insufficient information to code with codable children: Secondary | ICD-10-CM

## 2012-08-12 DIAGNOSIS — N898 Other specified noninflammatory disorders of vagina: Secondary | ICD-10-CM

## 2012-08-12 DIAGNOSIS — Z0489 Encounter for examination and observation for other specified reasons: Secondary | ICD-10-CM

## 2012-08-12 DIAGNOSIS — Z331 Pregnant state, incidental: Secondary | ICD-10-CM

## 2012-08-12 DIAGNOSIS — Z1389 Encounter for screening for other disorder: Secondary | ICD-10-CM

## 2012-08-12 LAB — POCT URINALYSIS DIPSTICK
Glucose, UA: NEGATIVE
Ketones, UA: NEGATIVE
Leukocytes, UA: NEGATIVE
Protein, UA: NEGATIVE
Urobilinogen, UA: NEGATIVE

## 2012-08-12 LAB — POCT WET PREP (WET MOUNT)

## 2012-08-12 NOTE — Patient Instructions (Signed)
ABCs of Pregnancy  A  Antepartum care is very important. Be sure you see your doctor and get prenatal care as soon as you think you are pregnant. At this time, you will be tested for infection, genetic abnormalities and potential problems with you and the pregnancy. This is the time to discuss diet, exercise, work, medications, labor, pain medication during labor and the possibility of a cesarean delivery. Ask any questions that may concern you. It is important to see your doctor regularly throughout your pregnancy. Avoid exposure to toxic substances and chemicals - such as cleaning solvents, lead and mercury, some insecticides, and paint. Pregnant women should avoid exposure to paint fumes, and fumes that cause you to feel ill, dizzy or faint. When possible, it is a good idea to have a pre-pregnancy consultation with your caregiver to begin some important recommendations your caregiver suggests such as, taking folic acid, exercising, quitting smoking, avoiding alcoholic beverages, etc.  B  Breastfeeding is the healthiest choice for both you and your baby. It has many nutritional benefits for the baby and health benefits for the mother. It also creates a very tight and loving bond between the baby and mother. Talk to your doctor, your family and friends, and your employer about how you choose to feed your baby and how they can support you in your decision. Not all birth defects can be prevented, but a woman can take actions that may increase her chance of having a healthy baby. Many birth defects happen very early in pregnancy, sometimes before a woman even knows she is pregnant. Birth defects or abnormalities of any child in your or the father's family should be discussed with your caregiver. Get a good support bra as your breast size changes. Wear it especially when you exercise and when nursing.   C  Celebrate the news of your pregnancy with the your spouse/father and family. Childbirth classes are helpful to  take for you and the spouse/father because it helps to understand what happens during the pregnancy, labor and delivery. Cesarean delivery should be discussed with your doctor so you are prepared for that possibility. The pros and cons of circumcision if it is a boy, should be discussed with your pediatrician. Cigarette smoking during pregnancy can result in low birth weight babies. It has been associated with infertility, miscarriages, tubal pregnancies, infant death (mortality) and poor health (morbidity) in childhood. Additionally, cigarette smoking may cause long-term learning disabilities. If you smoke, you should try to quit before getting pregnant and not smoke during the pregnancy. Secondary smoke may also harm a mother and her developing baby. It is a good idea to ask people to stop smoking around you during your pregnancy and after the baby is born. Extra calcium is necessary when you are pregnant and is found in your prenatal vitamin, in dairy products, green leafy vegetables and in calcium supplements.  D  A healthy diet according to your current weight and height, along with vitamins and mineral supplements should be discussed with your caregiver. Domestic abuse or violence should be made known to your doctor right away to get the situation corrected. Drink more water when you exercise to keep hydrated. Discomfort of your back and legs usually develops and progresses from the middle of the second trimester through to delivery of the baby. This is because of the enlarging baby and uterus, which may also affect your balance. Do not take illegal drugs. Illegal drugs can seriously harm the baby and you. Drink extra   fluids (water is best) throughout pregnancy to help your body keep up with the increases in your blood volume. Drink at least 6 to 8 glasses of water, fruit juice, or milk each day. A good way to know you are drinking enough fluid is when your urine looks almost like clear water or is very light  yellow.   E  Eat healthy to get the nutrients you and your unborn baby need. Your meals should include the five basic food groups. Exercise (30 minutes of light to moderate exercise a day) is important and encouraged during pregnancy, if there are no medical problems or problems with the pregnancy. Exercise that causes discomfort or dizziness should be stopped and reported to your caregiver. Emotions during pregnancy can change from being ecstatic to depression and should be understood by you, your partner and your family.  F  Fetal screening with ultrasound, amniocentesis and monitoring during pregnancy and labor is common and sometimes necessary. Take 400 micrograms of folic acid daily both before, when possible, and during the first few months of pregnancy to reduce the risk of birth defects of the brain and spine. All women who could possibly become pregnant should take a vitamin with folic acid, every day. It is also important to eat a healthy diet with fortified foods (enriched grain products, including cereals, rice, breads, and pastas) and foods with natural sources of folate (orange juice, green leafy vegetables, beans, peanuts, broccoli, asparagus, peas, and lentils). The father should be involved with all aspects of the pregnancy including, the prenatal care, childbirth classes, labor, delivery, and postpartum time. Fathers may also have emotional concerns about being a father, financial needs, and raising a family.  G  Genetic testing should be done appropriately. It is important to know your family and the father's history. If there have been problems with pregnancies or birth defects in your family, report these to your doctor. Also, genetic counselors can talk with you about the information you might need in making decisions about having a family. You can call a major medical center in your area for help in finding a board-certified genetic counselor. Genetic testing and counseling should be done  before pregnancy when possible, especially if there is a history of problems in the mother's or father's family. Certain ethnic backgrounds are more at risk for genetic defects.  H  Get familiar with the hospital where you will be having your baby. Get to know how long it takes to get there, the labor and delivery area, and the hospital procedures. Be sure your medical insurance is accepted there. Get your home ready for the baby including, clothes, the baby's room (when possible), furniture and car seat. Hand washing is important throughout the day, especially after handling raw meat and poultry, changing the baby's diaper or using the bathroom. This can help prevent the spread of many bacteria and viruses that cause infection. Your hair may become dry and thinner, but will return to normal a few weeks after the baby is born. Heartburn is a common problem that can be treated by taking antacids recommended by your caregiver, eating smaller meals 5 or 6 times a day, not drinking liquids when eating, drinking between meals and raising the head of your bed 2 to 3 inches.  I  Insurance to cover you, the baby, doctor and hospital should be reviewed so that you will be prepared to pay any costs not covered by your insurance plan. If you do not have medical insurance,   there are usually clinics and services available for you in your community. Take 30 milligrams of iron during your pregnancy as prescribed by your doctor to reduce the risk of low red blood cells (anemia) later in pregnancy. All women of childbearing age should eat a diet rich in iron.  J  There should be a joint effort for the mother, father and any other children to adapt to the pregnancy financially, emotionally, and psychologically during the pregnancy. Join a support group for moms-to-be. Or, join a class on parenting or childbirth. Have the family participate when possible.  K  Know your limits. Let your caregiver know if you experience any of the  following:   · Pain of any kind.  · Strong cramps.  · You develop a lot of weight in a short period of time (5 pounds in 3 to 5 days).  · Vaginal bleeding, leaking of amniotic fluid.  · Headache, vision problems.  · Dizziness, fainting, shortness of breath.  · Chest pain.  · Fever of 102° F (38.9° C) or higher.  · Gush of clear fluid from your vagina.  · Painful urination.  · Domestic violence.  · Irregular heartbeat (palpitations).  · Rapid beating of the heart (tachycardia).  · Constant feeling sick to your stomach (nauseous) and vomiting.  · Trouble walking, fluid retention (edema).  · Muscle weakness.  · If your baby has decreased activity.  · Persistent diarrhea.  · Abnormal vaginal discharge.  · Uterine contractions at 20-minute intervals.  · Back pain that travels down your leg.  L  Learn and practice that what you eat and drink should be in moderation and healthy for you and your baby. Legal drugs such as alcohol and caffeine are important issues for pregnant women. There is no safe amount of alcohol a woman can drink while pregnant. Fetal alcohol syndrome, a disorder characterized by growth retardation, facial abnormalities, and central nervous system dysfunction, is caused by a woman's use of alcohol during pregnancy. Caffeine, found in tea, coffee, soft drinks and chocolate, should also be limited. Be sure to read labels when trying to cut down on caffeine during pregnancy. More than 200 foods, beverages, and over-the-counter medications contain caffeine and have a high salt content! There are coffees and teas that do not contain caffeine.  M  Medical conditions such as diabetes, epilepsy, and high blood pressure should be treated and kept under control before pregnancy when possible, but especially during pregnancy. Ask your caregiver about any medications that may need to be changed or adjusted during pregnancy. If you are currently taking any medications, ask your caregiver if it is safe to take them  while you are pregnant or before getting pregnant when possible. Also, be sure to discuss any herbs or vitamins you are taking. They are medicines, too! Discuss with your doctor all medications, prescribed and over-the-counter, that you are taking. During your prenatal visit, discuss the medications your doctor may give you during labor and delivery.  N  Never be afraid to ask your doctor or caregiver questions about your health, the progress of the pregnancy, family problems, stressful situations, and recommendation for a pediatrician, if you do not have one. It is better to take all precautions and discuss any questions or concerns you may have during your office visits. It is a good idea to write down your questions before you visit the doctor.  O  Over-the-counter cough and cold remedies may contain alcohol or other ingredients that should   be avoided during pregnancy. Ask your caregiver about prescription, herbs or over-the-counter medications that you are taking or may consider taking while pregnant.   P  Physical activity during pregnancy can benefit both you and your baby by lessening discomfort and fatigue, providing a sense of well-being, and increasing the likelihood of early recovery after delivery. Light to moderate exercise during pregnancy strengthens the belly (abdominal) and back muscles. This helps improve posture. Practicing yoga, walking, swimming, and cycling on a stationary bicycle are usually safe exercises for pregnant women. Avoid scuba diving, exercise at high altitudes (over 3000 feet), skiing, horseback riding, contact sports, etc. Always check with your doctor before beginning any kind of exercise, especially during pregnancy and especially if you did not exercise before getting pregnant.  Q  Queasiness, stomach upset and morning sickness are common during pregnancy. Eating a couple of crackers or dry toast before getting out of bed. Foods that you normally love may make you feel sick to  your stomach. You may need to substitute other nutritious foods. Eating 5 or 6 small meals a day instead of 3 large ones may make you feel better. Do not drink with your meals, drink between meals. Questions that you have should be written down and asked during your prenatal visits.  R  Read about and make plans to baby-proof your home. There are important tips for making your home a safer environment for your baby. Review the tips and make your home safer for you and your baby. Read food labels regarding calories, salt and fat content in the food.  S  Saunas, hot tubs, and steam rooms should be avoided while you are pregnant. Excessive high heat may be harmful during your pregnancy. Your caregiver will screen and examine you for sexually transmitted diseases and genetic disorders during your prenatal visits. Learn the signs of labor. Sexual relations while pregnant is safe unless there is a medical or pregnancy problem and your caregiver advises against it.  T  Traveling long distances should be avoided especially in the third trimester of your pregnancy. If you do have to travel out of state, be sure to take a copy of your medical records and medical insurance plan with you. You should not travel long distances without seeing your doctor first. Most airlines will not allow you to travel after 36 weeks of pregnancy. Toxoplasmosis is an infection caused by a parasite that can seriously harm an unborn baby. Avoid eating undercooked meat and handling cat litter. Be sure to wear gloves when gardening. Tingling of the hands and fingers is not unusual and is due to fluid retention. This will go away after the baby is born.  U  Womb (uterus) size increases during the first trimester. Your kidneys will begin to function more efficiently. This may cause you to feel the need to urinate more often. You may also leak urine when sneezing, coughing or laughing. This is due to the growing uterus pressing against your bladder,  which lies directly in front of and slightly under the uterus during the first few months of pregnancy. If you experience burning along with frequency of urination or bloody urine, be sure to tell your doctor. The size of your uterus in the third trimester may cause a problem with your balance. It is advisable to maintain good posture and avoid wearing high heels during this time. An ultrasound of your baby may be necessary during your pregnancy and is safe for you and your baby.  V    Vaccinations are an important concern for pregnant women. Get needed vaccines before pregnancy. Center for Disease Control (www.cdc.gov) has clear guidelines for the use of vaccines during pregnancy. Review the list, be sure to discuss it with your doctor. Prenatal vitamins are helpful and healthy for you and the baby. Do not take extra vitamins except what is recommended. Taking too much of certain vitamins can cause overdose problems. Continuous vomiting should be reported to your caregiver. Varicose veins may appear especially if there is a family history of varicose veins. They should subside after the delivery of the baby. Support hose helps if there is leg discomfort.  W  Being overweight or underweight during pregnancy may cause problems. Try to get within 15 pounds of your ideal weight before pregnancy. Remember, pregnancy is not a time to be dieting! Do not stop eating or start skipping meals as your weight increases. Both you and your baby need the calories and nutrition you receive from a healthy diet. Be sure to consult with your doctor about your diet. There is a formula and diet plan available depending on whether you are overweight or underweight. Your caregiver or nutritionist can help and advise you if necessary.  X  Avoid X-rays. If you must have dental work or diagnostic tests, tell your dentist or physician that you are pregnant so that extra care can be taken. X-rays should only be taken when the risks of not taking  them outweigh the risk of taking them. If needed, only the minimum amount of radiation should be used. When X-rays are necessary, protective lead shields should be used to cover areas of the body that are not being X-rayed.  Y  Your baby loves you. Breastfeeding your baby creates a loving and very close bond between the two of you. Give your baby a healthy environment to live in while you are pregnant. Infants and children require constant care and guidance. Their health and safety should be carefully watched at all times. After the baby is born, rest or take a nap when the baby is sleeping.  Z  Get your ZZZs. Be sure to get plenty of rest. Resting on your side as often as possible, especially on your left side is advised. It provides the best circulation to your baby and helps reduce swelling. Try taking a nap for 30 to 45 minutes in the afternoon when possible. After the baby is born rest or take a nap when the baby is sleeping. Try elevating your feet for that amount of time when possible. It helps the circulation in your legs and helps reduce swelling.   Most information courtesy of the CDC.  Document Released: 10/26/2005 Document Revised: 01/18/2012 Document Reviewed: 07/10/2009  [employer]® Patient Information ©2013 [employer].

## 2012-08-12 NOTE — Progress Notes (Signed)
[redacted]w[redacted]d  FHT not done because pt is < than 16weeks.  Last Pap: pt cannot recall Pt already had Genetic Testing on 08-09-2012 [facility]. Pt states she has no concerns today.

## 2012-08-12 NOTE — Progress Notes (Deleted)
[redacted]w[redacted]d

## 2012-08-13 LAB — CULTURE, OB URINE: Colony Count: NO GROWTH

## 2012-08-14 NOTE — Progress Notes (Signed)
  Subjective:    Marcia Bryant is being seen today for her first obstetrical visit.  This is a planned pregnancy. She is at [redacted]w[redacted]d gestation. Her obstetrical history is significant for none. Relationship with FOB: spouse, living together. "Rulon Eisenmenger" Pregnancy history fully reviewed.  Patient reports no complaints.  Review of Systems:   Review of Systems  All other systems reviewed and are negative.    Objective:     BP 100/62  Wt 180 lb (81.647 kg)  LMP 04/20/2012  Breastfeeding? Unknown Physical Exam  Nursing note and vitals reviewed. Constitutional: She is oriented to person, place, and time. She appears well-developed and well-nourished.  HENT:  Head: Normocephalic and atraumatic.  Eyes: Pupils are equal, round, and reactive to light.  Neck: Normal range of motion. Neck supple.  Cardiovascular: Normal rate, regular rhythm and normal heart sounds.   Respiratory: Effort normal and breath sounds normal.  GI: Soft. Bowel sounds are normal.  Genitourinary: Vagina normal.  Musculoskeletal: Normal range of motion. She exhibits no edema.  Neurological: She is alert and oriented to person, place, and time. She has normal reflexes.  Skin: Skin is warm and dry.  Psychiatric: She has a normal mood and affect. Her behavior is normal.    Maternal Exam:  Abdomen: Patient reports no abdominal tenderness. Fundal height is 15.    Introitus: Normal vulva. Normal vagina.  Pelvis: adequate for delivery.   Cervix: Cervix evaluated by sterile speculum exam and digital exam.     Fetal Exam Fetal Monitor Review: Mode: hand-held doppler probe.   Baseline rate: 155.         Assessment:    Pregnancy: G3P1011 There is no problem list on file for this patient.      Plan:     Initial labs rv'd Bl Type O pos, hgb 13.0, UA cx was not sent, will send today, SCT neg Prenatal vitamins. Problem list reviewed and updated. Pap smear: sent Wet prep, OSOM: neg GC/CT sent  AFP3  discussed: results reviewed. WNL Role of ultrasound in pregnancy discussed; fetal survey: requested. Anatomy NV  Amniocentesis discussed: not indicated. RTO 4 wks    Sahib Pella M 08/14/2012

## 2012-08-15 LAB — PAP IG, CT-NG, RFX HPV ASCU

## 2012-09-15 ENCOUNTER — Ambulatory Visit (INDEPENDENT_AMBULATORY_CARE_PROVIDER_SITE_OTHER): Payer: PRIVATE HEALTH INSURANCE | Admitting: Obstetrics and Gynecology

## 2012-09-15 ENCOUNTER — Ambulatory Visit (INDEPENDENT_AMBULATORY_CARE_PROVIDER_SITE_OTHER): Payer: PRIVATE HEALTH INSURANCE

## 2012-09-15 VITALS — BP 100/64 | Wt 188.0 lb

## 2012-09-15 DIAGNOSIS — Z0489 Encounter for examination and observation for other specified reasons: Secondary | ICD-10-CM

## 2012-09-15 DIAGNOSIS — IMO0002 Reserved for concepts with insufficient information to code with codable children: Secondary | ICD-10-CM

## 2012-09-15 DIAGNOSIS — Z1389 Encounter for screening for other disorder: Secondary | ICD-10-CM

## 2012-09-15 DIAGNOSIS — O0930 Supervision of pregnancy with insufficient antenatal care, unspecified trimester: Secondary | ICD-10-CM | POA: Insufficient documentation

## 2012-09-15 DIAGNOSIS — Z331 Pregnant state, incidental: Secondary | ICD-10-CM

## 2012-09-15 DIAGNOSIS — Z3490 Encounter for supervision of normal pregnancy, unspecified, unspecified trimester: Secondary | ICD-10-CM | POA: Insufficient documentation

## 2012-09-15 LAB — US OB COMP + 14 WK

## 2012-09-15 NOTE — Progress Notes (Signed)
Doing well.  No issues. [place] reviewed--had normal Quad screen LV.

## 2012-09-15 NOTE — Progress Notes (Signed)
Pt stated no issues today.  19w34 EFW(AC,BPD,FL)hadl: 10oz +/-0.92 oz AUA 51% tile Expected 45% tile CX; 3.79 cm  Variable presentation. Anterior placenta. No previa. placenta edge to cx =6.4 Normal placental cord insertion. Normal fluid.AP pocked =4.6 Concordant measurements with first ultrasound GA/EDD No featal abnormality is seen .open hands,5th digit seen. Female gender. CX closed . Normal ovaries/adnexa .

## 2012-09-16 ENCOUNTER — Other Ambulatory Visit: Payer: PRIVATE HEALTH INSURANCE

## 2012-09-16 ENCOUNTER — Encounter: Payer: PRIVATE HEALTH INSURANCE | Admitting: Obstetrics and Gynecology

## 2012-10-12 ENCOUNTER — Encounter: Payer: Self-pay | Admitting: Obstetrics and Gynecology

## 2012-10-12 ENCOUNTER — Ambulatory Visit (INDEPENDENT_AMBULATORY_CARE_PROVIDER_SITE_OTHER): Payer: PRIVATE HEALTH INSURANCE | Admitting: Obstetrics and Gynecology

## 2012-10-12 VITALS — BP 104/58 | Wt 187.0 lb

## 2012-10-12 DIAGNOSIS — Z331 Pregnant state, incidental: Secondary | ICD-10-CM

## 2012-10-12 DIAGNOSIS — Z3490 Encounter for supervision of normal pregnancy, unspecified, unspecified trimester: Secondary | ICD-10-CM

## 2012-10-12 NOTE — Progress Notes (Signed)
[redacted]w[redacted]d glucola at NV No complaints RTO 4wks

## 2012-10-13 ENCOUNTER — Encounter: Payer: PRIVATE HEALTH INSURANCE | Admitting: Obstetrics and Gynecology

## 2012-11-08 ENCOUNTER — Ambulatory Visit (INDEPENDENT_AMBULATORY_CARE_PROVIDER_SITE_OTHER): Payer: PRIVATE HEALTH INSURANCE | Admitting: Obstetrics and Gynecology

## 2012-11-08 ENCOUNTER — Encounter: Payer: Self-pay | Admitting: Obstetrics and Gynecology

## 2012-11-08 VITALS — BP 98/60 | Wt 190.0 lb

## 2012-11-08 DIAGNOSIS — N898 Other specified noninflammatory disorders of vagina: Secondary | ICD-10-CM

## 2012-11-08 DIAGNOSIS — O26899 Other specified pregnancy related conditions, unspecified trimester: Secondary | ICD-10-CM

## 2012-11-08 DIAGNOSIS — Z3490 Encounter for supervision of normal pregnancy, unspecified, unspecified trimester: Secondary | ICD-10-CM

## 2012-11-08 DIAGNOSIS — Z331 Pregnant state, incidental: Secondary | ICD-10-CM

## 2012-11-08 LAB — POCT URINALYSIS DIPSTICK
Bilirubin, UA: NEGATIVE
Glucose, UA: NEGATIVE
Leukocytes, UA: NEGATIVE
Nitrite, UA: NEGATIVE

## 2012-11-08 LAB — HEMOGLOBIN: Hemoglobin: 12.2 g/dL (ref 12.0–15.0)

## 2012-11-08 LAB — POCT WET PREP (WET MOUNT): pH: 4.5

## 2012-11-08 LAB — POCT OSOM BVBLUE TEST: Bacterial Vaginosis: NEGATIVE

## 2012-11-08 NOTE — Progress Notes (Signed)
[redacted]w[redacted]d Pt has vaginal discharge a lot x 2weeks .  No itching burning, odor or abdominal pain Glucola given  Wet Prep:  NEG Oscom BV:  NEG Oscom trich:  NEG

## 2012-11-09 LAB — GLUCOSE TOLERANCE, 1 HOUR (50G) W/O FASTING: Glucose, 1 Hour GTT: 103 mg/dL (ref 70–140)

## 2012-11-09 NOTE — L&D Delivery Note (Signed)
Delivery Note Pt's cervix complete at 0504, with urge to push.  Pushed well, and at 5:38 AM a viable female "Marcello Moores" was delivered via Vaginal, Spontaneous Delivery (Presentation: Right Occiput Anterior).  APGAR: 4, 6; weight 8 lb 9.8 oz (3907 g).  Fetal head slow to deliver.  Tight nuchal noted, and unable to reduce over head initially.  Shoulder dystocia for approximately 30-40 sec; reduced w/ HOB down, McRoberts, and suprapubic pressures, and as shoulders delivered, able to reduced nuchal.  Body dystocia even after shoulders reduced.  Newborn flaccid and no response w/ tactile stimulation at perineum.  Cord quickly clamped and cut, and pt taken to warmer and code Apgar called.  RN's began code, and NICU arrived shortly thereafter.  Dr. Rulon Abide did transfer newborn to NICU for observation.   Placenta status: Intact, Spontaneous, Tomasa Blase and sent to pathology.  Cord: 3 vessels with the following complications: None.  Cord pH: arterial=7.19; venous=7.34. .. Filed Vitals:   01/26/13 0601 01/26/13 0616 01/26/13 0631 01/26/13 0646  BP: 171/101 151/97 160/91 156/102  Pulse: 103 99 98 97  Temp: 99.8 F (37.7 C)     TempSrc: Oral     Resp: 18 18 18 18   Height:      Weight:      SpO2:       Anesthesia: Epidural  Episiotomy: None Lacerations: None Suture Repair: n/a Est. Blood Loss (mL):   Mom to AICU.  Baby to NICU. Mom held baby briefly before transport.  Husband arrived just as newborn going out door to NICU.   Pt w/o PIH s/s.  PIH labs w/ Mag level drawn around 0600.  Pt desires outpatient circumcision.   Per c/w dr. Su Hilt, will CTO BP for now; and pt will just continue on Magnesium sulfate w/ prn IV Labetalol. Bernisha Verma H 01/26/2013, 6:57 AM

## 2012-11-10 LAB — GC/CHLAMYDIA PROBE AMP: CT Probe RNA: NEGATIVE

## 2012-11-15 ENCOUNTER — Telehealth: Payer: Self-pay | Admitting: Obstetrics and Gynecology

## 2012-11-15 NOTE — Telephone Encounter (Signed)
Tc to pt per telephone call. Pt c/o abdominal pain around belly button x 2 wks. No vaginal bldg. No abn vag d/c. Positive FM. Pt states,"drinks a lot of water daily". Pt has not tried otc meds for pain. Informed pt to increase water intake and try otc Tylenol(reg or xs). Told to cb if no improvement. Pt voices understanding.

## 2012-11-24 ENCOUNTER — Encounter: Payer: Self-pay | Admitting: Obstetrics and Gynecology

## 2012-11-24 ENCOUNTER — Ambulatory Visit: Payer: PRIVATE HEALTH INSURANCE | Admitting: Obstetrics and Gynecology

## 2012-11-24 VITALS — BP 104/60 | Wt 192.0 lb

## 2012-11-24 DIAGNOSIS — Z3490 Encounter for supervision of normal pregnancy, unspecified, unspecified trimester: Secondary | ICD-10-CM

## 2012-11-24 NOTE — Progress Notes (Signed)
[redacted]w[redacted]d GCT 103 neg, Hgb 12.2 and RPR NR No complaints FKCs RTO 2wks

## 2012-12-08 ENCOUNTER — Encounter: Payer: Self-pay | Admitting: Obstetrics and Gynecology

## 2012-12-08 ENCOUNTER — Ambulatory Visit: Payer: PRIVATE HEALTH INSURANCE | Admitting: Obstetrics and Gynecology

## 2012-12-08 VITALS — BP 100/56 | Wt 195.0 lb

## 2012-12-08 DIAGNOSIS — Z331 Pregnant state, incidental: Secondary | ICD-10-CM

## 2012-12-08 NOTE — Progress Notes (Signed)
[redacted]w[redacted]d The patient complains of pain in her lower back, abdomen, and lower legs.  She asked that I complete paperwork for social services to pay for day care for her children.  I told the patient that our be happy to state the patient has these complaints, but I cannot say that she is disabled.  No cannot say that these will make it so that she cannot care for her children. Letter written. Return office in 2 weeks. Dr. Stefano Gaul

## 2012-12-08 NOTE — Progress Notes (Signed)
[redacted]w[redacted]d Pt has no complaints 

## 2012-12-08 NOTE — Progress Notes (Signed)
Pt needs paper work filled out for [employer].

## 2012-12-09 ENCOUNTER — Encounter (HOSPITAL_COMMUNITY): Payer: Self-pay | Admitting: *Deleted

## 2012-12-09 ENCOUNTER — Emergency Department (HOSPITAL_COMMUNITY)
Admission: EM | Admit: 2012-12-09 | Discharge: 2012-12-09 | Disposition: A | Discharge status: Home / Self Care | Payer: Medicaid Other | Source: Home / Self Care | Attending: Family Medicine | Admitting: Family Medicine

## 2012-12-09 DIAGNOSIS — H6690 Otitis media, unspecified, unspecified ear: Secondary | ICD-10-CM

## 2012-12-09 DIAGNOSIS — H6692 Otitis media, unspecified, left ear: Secondary | ICD-10-CM

## 2012-12-09 HISTORY — DX: Acute vaginitis: B96.89

## 2012-12-09 HISTORY — DX: Other specified bacterial agents as the cause of diseases classified elsewhere: N76.0

## 2012-12-09 HISTORY — DX: Candidiasis, unspecified: B37.9

## 2012-12-09 MED ORDER — AMOXICILLIN 500 MG PO CAPS: 500.0000 mg | ORAL_CAPSULE | Freq: Three times a day (TID) | ORAL | Status: DC

## 2012-12-09 NOTE — ED Notes (Signed)
C/O nasal congestion and slight sore throat for past week.  Today started with sharp left ear pains.  Pt is 7 months pregnant.

## 2012-12-09 NOTE — ED Notes (Signed)
Departure condition documented in error (wrong pt).

## 2012-12-09 NOTE — ED Provider Notes (Signed)
History     CSN: [id number]  Arrival date & time 12/09/12  1827   First MD Initiated Contact with Patient 12/09/12 1828      Chief Complaint  Patient presents with  . Otalgia    (Consider location/radiation/quality/duration/timing/severity/associated sxs/prior treatment) Patient is a 27 y.o. female presenting with ear pain. The history is provided by the patient.  Otalgia This is a new problem. The current episode started 6 to 12 hours ago. There is pain in the left ear. The problem has been gradually worsening. There has been no fever. The pain is moderate. Associated symptoms include rhinorrhea. Pertinent negatives include no ear discharge and no cough. Past medical history comments: 7 mo preg.Marland Kitchen    Past Medical History  Diagnosis Date  . Yeast infection   . Bacterial vaginosis     History reviewed. No pertinent past surgical history.  Family History  Problem Relation Age of Onset  . Hypertension Father   . Hypertension Paternal Grandmother     History  Substance Use Topics  . Smoking status: Never Smoker   . Smokeless tobacco: Never Used  . Alcohol Use: No    OB History    Grav Para Term Preterm Abortions TAB SAB Ect Mult Living   3 1 1  1  1   1       Review of Systems  Constitutional: Negative.   HENT: Positive for ear pain, congestion, rhinorrhea and postnasal drip. Negative for ear discharge.   Respiratory: Negative for cough.   Cardiovascular: Negative.   Gastrointestinal: Negative.     Allergies  Review of patient's allergies indicates no known allergies.  Home Medications   Current Outpatient Rx  Name  Route  Sig  Dispense  Refill  . PRENATAL MULTIVITAMIN CH   Oral   Take 1 tablet by mouth daily.   30 tablet   5   . ACETAMINOPHEN 325 MG PO TABS   Oral   Take 650 mg by mouth every 6 (six) hours as needed. For pain         . AMOXICILLIN 500 MG PO CAPS   Oral   Take 1 capsule (500 mg total) by mouth 3 (three) times daily.   30  capsule   0   . RANITIDINE HCL 150 MG PO TABS   Oral   Take 1 tablet (150 mg total) by mouth 2 (two) times daily.   60 tablet   0     BP 118/75  Pulse 100  Temp 97.3 F (36.3 C) (Oral)  Resp 18  SpO2 100%  LMP 04/20/2012  Breastfeeding? No  Physical Exam  Nursing note and vitals reviewed. Constitutional: She appears well-developed and well-nourished.  HENT:  Head: Normocephalic.  Right Ear: External ear normal.  Left Ear: Ear canal normal. No mastoid tenderness. Tympanic membrane is erythematous and bulging. Tympanic membrane is not perforated. Tympanic membrane mobility is abnormal. A middle ear effusion is present. No hemotympanum.  Ears:  Mouth/Throat: Oropharynx is clear and moist.    ED Course  Procedures (including critical care time)  Labs Reviewed - No data to display No results found.   1. Otitis media of left ear       MDM          Linna Hoff, MD 12/09/12 [phone]

## 2012-12-22 ENCOUNTER — Encounter: Payer: PRIVATE HEALTH INSURANCE | Admitting: Obstetrics and Gynecology

## 2012-12-28 ENCOUNTER — Ambulatory Visit: Payer: PRIVATE HEALTH INSURANCE | Admitting: Family Medicine

## 2012-12-28 ENCOUNTER — Encounter: Payer: Self-pay | Admitting: Family Medicine

## 2012-12-28 VITALS — BP 114/60 | Wt 197.0 lb

## 2012-12-28 DIAGNOSIS — Z331 Pregnant state, incidental: Secondary | ICD-10-CM

## 2012-12-28 NOTE — Progress Notes (Signed)
[redacted]w[redacted]d Pt complains of pains in stomach, especially around the navel area.  Pain has not increased or changed since last visit.  Denies contractions.  Discussed comforts measures at length. Try tylenol 2 tablet q 4-6 hours, no more than 12 tabs a day. Plans to use Dr. Hyacinth Meeker for Pediatrician, breast/bottle feed, and unsure contraceptive method (handout given). Continue FKCs.  Call office if symptoms worsen, contraction, PRN.  Navel: dry skin, no discharge, no redness, no lesions: pt to use moisturizer on navel to help with drying. 2 ROB for GBS. L.Wenzel Backlund, FNP-BC

## 2012-12-28 NOTE — Patient Instructions (Addendum)
Rest whenever possible if discomfort/pain increases. Tylenol 2 tablets q 4-6 hours as needed, do not take more than 12 tablets in 1 day. Try heating pad to lower back. Increase fluids to 8 glasses a day, prefer if water.

## 2013-01-11 ENCOUNTER — Ambulatory Visit: Payer: PRIVATE HEALTH INSURANCE | Admitting: Obstetrics and Gynecology

## 2013-01-11 VITALS — BP 128/72 | Wt 202.0 lb

## 2013-01-11 DIAGNOSIS — Z331 Pregnant state, incidental: Secondary | ICD-10-CM

## 2013-01-11 DIAGNOSIS — Z3490 Encounter for supervision of normal pregnancy, unspecified, unspecified trimester: Secondary | ICD-10-CM

## 2013-01-11 DIAGNOSIS — O0930 Supervision of pregnancy with insufficient antenatal care, unspecified trimester: Secondary | ICD-10-CM

## 2013-01-11 NOTE — Progress Notes (Signed)
[redacted]w[redacted]d GBS/GC/CHL DONE

## 2013-01-13 LAB — STREP B DNA PROBE: GBSP: NEGATIVE

## 2013-01-14 ENCOUNTER — Observation Stay (HOSPITAL_COMMUNITY)
Admission: AD | Admit: 2013-01-14 | Discharge: 2013-01-15 | Disposition: A | Discharge status: Home / Self Care | Payer: Medicaid Other | Source: Ambulatory Visit | Attending: Obstetrics and Gynecology | Admitting: Obstetrics and Gynecology

## 2013-01-14 ENCOUNTER — Encounter (HOSPITAL_COMMUNITY): Payer: Self-pay | Admitting: Obstetrics and Gynecology

## 2013-01-14 DIAGNOSIS — O368190 Decreased fetal movements, unspecified trimester, not applicable or unspecified: Secondary | ICD-10-CM | POA: Diagnosis present

## 2013-01-14 DIAGNOSIS — O99891 Other specified diseases and conditions complicating pregnancy: Principal | ICD-10-CM | POA: Insufficient documentation

## 2013-01-14 DIAGNOSIS — T5891XA Toxic effect of carbon monoxide from unspecified source, accidental (unintentional), initial encounter: Secondary | ICD-10-CM

## 2013-01-14 DIAGNOSIS — T5894XA Toxic effect of carbon monoxide from unspecified source, undetermined, initial encounter: Secondary | ICD-10-CM | POA: Insufficient documentation

## 2013-01-14 DIAGNOSIS — Z3490 Encounter for supervision of normal pregnancy, unspecified, unspecified trimester: Secondary | ICD-10-CM

## 2013-01-14 DIAGNOSIS — Y92009 Unspecified place in unspecified non-institutional (private) residence as the place of occurrence of the external cause: Secondary | ICD-10-CM | POA: Insufficient documentation

## 2013-01-14 LAB — CARBOXYHEMOGLOBIN
Carboxyhemoglobin: 4.6 % — ABNORMAL HIGH (ref 0.5–1.5)
Methemoglobin: 1.4 % (ref 0.0–1.5)

## 2013-01-14 LAB — COMPREHENSIVE METABOLIC PANEL
AST: 12 U/L (ref 0–37)
CO2: 21 mEq/L (ref 19–32)
Calcium: 8.7 mg/dL (ref 8.4–10.5)
Creatinine, Ser: 0.76 mg/dL (ref 0.50–1.10)
GFR calc Af Amer: >90 mL/min (ref >90)
GFR calc non Af Amer: >90 mL/min (ref >90)
Sodium: 134 mEq/L — ABNORMAL LOW (ref 135–145)
Total Protein: 5 g/dL — ABNORMAL LOW (ref 6.0–8.3)

## 2013-01-14 LAB — LACTIC ACID, PLASMA: Lactic Acid, Venous: <0.2 mmol/L — ABNORMAL LOW (ref 0.5–2.2)

## 2013-01-14 MED ORDER — ACETAMINOPHEN 325 MG PO TABS: 650.0000 mg | ORAL_TABLET | ORAL | Status: DC | PRN

## 2013-01-14 MED ORDER — PRENATAL MULTIVITAMIN CH: 1.0000 | ORAL_TABLET | Freq: Every day | ORAL | Status: DC

## 2013-01-14 MED ORDER — CALCIUM CARBONATE ANTACID 500 MG PO CHEW: 2.0000 | CHEWABLE_TABLET | ORAL | Status: DC | PRN

## 2013-01-14 MED ORDER — ACETAMINOPHEN 325 MG PO TABS
650.0000 mg | ORAL_TABLET | Freq: Four times a day (QID) | ORAL | Status: DC | PRN
  Administered 2013-01-14: 650 mg via ORAL
  Filled 2013-01-14: qty 2

## 2013-01-14 MED ORDER — ZOLPIDEM TARTRATE 5 MG PO TABS: 5.0000 mg | ORAL_TABLET | Freq: Every evening | ORAL | Status: DC | PRN

## 2013-01-14 MED ORDER — DOCUSATE SODIUM 100 MG PO CAPS: 100.0000 mg | ORAL_CAPSULE | Freq: Every day | ORAL | Status: DC

## 2013-01-14 NOTE — MAU Note (Signed)
Awaiting Poison control to call back

## 2013-01-14 NOTE — Progress Notes (Addendum)
Patient ID: Marcia Bryant, female   DOB: 19-May-1986, 27 y.o.   MRN: [id number]  Subjective: Pt denies any complaints at present.  Remains on O2.  Objective: FHR baseline: 135 bpm Variability:  Moderate Accels:  Present Decels:  None Toco:  UCs every 2-32mins, mild to palpation.  Assessment: IUP at 36.6wks Carbon Monoxide exposure  Plan: Received return call from [place] at [facility].  States that Dr. Rodman Comp, toxicologist, has reviewed the patient's information and at this time does not feel that hyperbaric oxygen is indicated and stated that if the patient is no longer symptomatic then she may be discharged to home.  Consult with Dr. Estanislado Pandy and above information communicated. Orders recvd to admit for 23hr observation, CMP, serum lactate, serum cyanide, cardiac enzymes and EKG recvd.  Will continue O2 per mask at the present time.  Continuous FHR monitoring, otherwise routine antepartum orders.     Elsie Ra, CNM
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
## 2013-01-14 NOTE — Progress Notes (Addendum)
Patient ID: Marcia Bryant, female   DOB: 1986/04/12, 27 y.o.   MRN: [id number]  Subjective: Pt now reports complete resolution of headache.  Denies any further dizziness or eye irritation.  Denies any complaints at present.    Physical Exam: Physical exam currently unchanged. O2 saturation remains 100% on O2 10 liters per nonrebreathing mask.  FHR baseline:  135 bpm Variability:  Moderate Accels:  Present Decels:  Absent FHR: Cat I Toco:  Contd irregular UCs, mild to palpation that pt denies being aware of.  Results for Glascoe, Marcia (MRN [id number])   Ref. Range 01/14/2013 10:53  Total hemoglobin Latest Range: 12.0-16.0 g/dL 78.2  Carboxyhemoglobin Latest Range: 0.5-1.5 % 4.6 (H)  Methemoglobin Latest Range: 0.0-1.5 % 1.4  O2 Saturation No range found 100.2   Assessment: IUP at 36.6wks Carbon Monoxide exposure with elevated carboxyhemoglobin level.  Plan: Consult obtained with Dr. Estanislado Pandy. Continue O2 per mask at present. TC to [facility] ED and spoke with PA who rec we contact [facility] for further guidance. TC to [facility] and spoke with [place].  She states she will give the information to toxicologist and have him return call to [place].   Results and current plan discussed with patient.   Elsie Ra, CNM
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
## 2013-01-15 LAB — CARBOXYHEMOGLOBIN
Methemoglobin: 1.2 % (ref 0.0–1.5)
O2 Saturation: 93.9 %
Total hemoglobin: 12.8 g/dL (ref 12.0–16.0)
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
## 2013-01-19 ENCOUNTER — Encounter: Payer: PRIVATE HEALTH INSURANCE | Admitting: Obstetrics and Gynecology

## 2013-01-20 ENCOUNTER — Ambulatory Visit: Payer: PRIVATE HEALTH INSURANCE | Admitting: Obstetrics and Gynecology

## 2013-01-20 ENCOUNTER — Encounter: Payer: Self-pay | Admitting: Obstetrics and Gynecology

## 2013-01-20 VITALS — BP 126/82 | Wt 207.0 lb

## 2013-01-20 DIAGNOSIS — Z331 Pregnant state, incidental: Secondary | ICD-10-CM

## 2013-01-20 DIAGNOSIS — O1213 Gestational proteinuria, third trimester: Secondary | ICD-10-CM

## 2013-01-20 LAB — COMPREHENSIVE METABOLIC PANEL
Albumin: 2.7 g/dL — ABNORMAL LOW (ref 3.5–5.2)
BUN: 6 mg/dL (ref 6–23)
Calcium: 8.4 mg/dL (ref 8.4–10.5)
Chloride: 107 mEq/L (ref 96–112)
Glucose, Bld: 134 mg/dL — ABNORMAL HIGH (ref 70–99)
Potassium: 4.1 mEq/L (ref 3.5–5.3)

## 2013-01-20 LAB — CBC
Hemoglobin: 12.7 g/dL (ref 12.0–15.0)
MCH: 26.6 pg (ref 26.0–34.0)
MCHC: 34.5 g/dL (ref 30.0–36.0)
MCV: 77.1 fL — ABNORMAL LOW (ref 78.0–100.0)
RBC: 4.77 MIL/uL (ref 3.87–5.11)

## 2013-01-20 NOTE — Progress Notes (Signed)
[redacted]w[redacted]d The patient was recently hospitalized for carbon monoxide poisoning.  She has completely recovered and there should be no consequences to the patient or her baby.  Maternal fetal medicine consult was obtained. The patient denies headaches, blurred vision, and right upper quadrant tenderness.  CBC and comprehensive metabolic panel were within normal limits. Chest: Clear.  Heart: Regular rate and rhythm.  Abdomen: Nontender.  Reflexes: Normal.  No clonus.  2+ edema. Repeat urinalysis: 1+ protein. PIH labs today. The pt will complete a 24 hour urine protein and take to the [facility] on 01/21/13. PreEclampsia discussed. Beta strep, GC, and Chlamydia were negative. Return to office in 1 week. Dr. Stefano Gaul

## 2013-01-20 NOTE — Progress Notes (Signed)
[redacted]w[redacted]d. GBS negative. Pt has 2-3+ edema in feet. Irreg ctx. Wants cx check.

## 2013-01-23 ENCOUNTER — Other Ambulatory Visit: Payer: PRIVATE HEALTH INSURANCE

## 2013-01-23 ENCOUNTER — Telehealth: Payer: Self-pay | Admitting: Obstetrics and Gynecology

## 2013-01-23 DIAGNOSIS — O139 Gestational [pregnancy-induced] hypertension without significant proteinuria, unspecified trimester: Secondary | ICD-10-CM

## 2013-01-23 NOTE — Telephone Encounter (Signed)
TC to patient--I was expecting her to bring 24 hour urine in today, but patient has not come yet. Patient advises she was going to start 24 hour urine tonight and bring to hospital on Sunday. Will process here. If she is unable to bring to hospital, she should bring to office on Monday.

## 2013-01-24 ENCOUNTER — Inpatient Hospital Stay (HOSPITAL_COMMUNITY)
Admission: AD | Admit: 2013-01-24 | Discharge: 2013-01-28 | DRG: 774 | Disposition: A | Discharge status: Home / Self Care | Payer: Medicaid Other | Source: Ambulatory Visit | Attending: Obstetrics and Gynecology | Admitting: Obstetrics and Gynecology

## 2013-01-24 ENCOUNTER — Encounter: Payer: Self-pay | Admitting: Obstetrics and Gynecology

## 2013-01-24 ENCOUNTER — Other Ambulatory Visit: Payer: Self-pay | Admitting: Obstetrics and Gynecology

## 2013-01-24 ENCOUNTER — Encounter (HOSPITAL_COMMUNITY): Payer: Self-pay | Admitting: *Deleted

## 2013-01-24 ENCOUNTER — Telehealth: Payer: Self-pay | Admitting: Certified Nurse Midwife

## 2013-01-24 DIAGNOSIS — Z3490 Encounter for supervision of normal pregnancy, unspecified, unspecified trimester: Secondary | ICD-10-CM

## 2013-01-24 DIAGNOSIS — O0930 Supervision of pregnancy with insufficient antenatal care, unspecified trimester: Secondary | ICD-10-CM

## 2013-01-24 DIAGNOSIS — O1210 Gestational proteinuria, unspecified trimester: Secondary | ICD-10-CM | POA: Insufficient documentation

## 2013-01-24 DIAGNOSIS — IMO0002 Reserved for concepts with insufficient information to code with codable children: Principal | ICD-10-CM | POA: Diagnosis present

## 2013-01-24 DIAGNOSIS — O1213 Gestational proteinuria, third trimester: Secondary | ICD-10-CM

## 2013-01-24 DIAGNOSIS — O368190 Decreased fetal movements, unspecified trimester, not applicable or unspecified: Secondary | ICD-10-CM | POA: Diagnosis present

## 2013-01-24 DIAGNOSIS — O1490 Unspecified pre-eclampsia, unspecified trimester: Secondary | ICD-10-CM | POA: Diagnosis present

## 2013-01-24 DIAGNOSIS — O1493 Unspecified pre-eclampsia, third trimester: Secondary | ICD-10-CM

## 2013-01-24 LAB — URINALYSIS, ROUTINE W REFLEX MICROSCOPIC
Bilirubin Urine: NEGATIVE
Glucose, UA: NEGATIVE mg/dL
Leukocytes, UA: NEGATIVE
Nitrite: NEGATIVE
Specific Gravity, Urine: 1.015 (ref 1.005–1.030)
pH: 6 (ref 5.0–8.0)

## 2013-01-24 LAB — URINE MICROSCOPIC-ADD ON

## 2013-01-24 LAB — COMPREHENSIVE METABOLIC PANEL
Alkaline Phosphatase: 160 U/L — ABNORMAL HIGH (ref 39–117)
BUN: 7 mg/dL (ref 6–23)
GFR calc Af Amer: >90 mL/min (ref >90)
Glucose, Bld: 65 mg/dL — ABNORMAL LOW (ref 70–99)
Potassium: 4 mEq/L (ref 3.5–5.1)
Total Protein: 5.6 g/dL — ABNORMAL LOW (ref 6.0–8.3)

## 2013-01-24 LAB — CBC
HCT: 35.9 % — ABNORMAL LOW (ref 36.0–46.0)
Hemoglobin: 12.1 g/dL (ref 12.0–15.0)
MCH: 26.5 pg (ref 26.0–34.0)
MCHC: 33.7 g/dL (ref 30.0–36.0)
MCV: 78.6 fL (ref 78.0–100.0)

## 2013-01-24 LAB — TYPE AND SCREEN: ABO/RH(D): O POS

## 2013-01-24 LAB — PROTEIN, URINE, 24 HOUR: Protein, 24H Urine: 6608 mg/d — ABNORMAL HIGH (ref 50–100)

## 2013-01-24 LAB — LACTATE DEHYDROGENASE: LDH: 276 U/L — ABNORMAL HIGH (ref 94–250)

## 2013-01-24 MED ORDER — OXYTOCIN 40 UNITS IN LACTATED RINGERS INFUSION - SIMPLE MED: 62.5000 mL/h | INTRAVENOUS | Status: DC

## 2013-01-24 MED ORDER — LACTATED RINGERS IV SOLN
INTRAVENOUS | Status: DC
  Administered 2013-01-24 – 2013-01-25 (×2): via INTRAVENOUS
  Administered 2013-01-25: 100 mL/h via INTRAVENOUS
  Administered 2013-01-25: 22:00:00 via INTRAVENOUS

## 2013-01-24 MED ORDER — TERBUTALINE SULFATE 1 MG/ML IJ SOLN: 0.2500 mg | Freq: Once | INTRAMUSCULAR | Status: AC | PRN

## 2013-01-24 MED ORDER — ACETAMINOPHEN 325 MG PO TABS: 650.0000 mg | ORAL_TABLET | ORAL | Status: DC | PRN

## 2013-01-24 MED ORDER — IBUPROFEN 600 MG PO TABS: 600.0000 mg | ORAL_TABLET | Freq: Four times a day (QID) | ORAL | Status: DC | PRN

## 2013-01-24 MED ORDER — ZOLPIDEM TARTRATE 5 MG PO TABS
5.0000 mg | ORAL_TABLET | Freq: Every evening | ORAL | Status: DC | PRN
  Administered 2013-01-24: 5 mg via ORAL
  Filled 2013-01-24: qty 1

## 2013-01-24 MED ORDER — ONDANSETRON HCL 4 MG/2ML IJ SOLN: 4.0000 mg | Freq: Four times a day (QID) | INTRAMUSCULAR | Status: DC | PRN

## 2013-01-24 MED ORDER — MISOPROSTOL 25 MCG QUARTER TABLET
25.0000 ug | ORAL_TABLET | ORAL | Status: DC | PRN
  Administered 2013-01-24 – 2013-01-25 (×5): 25 ug via VAGINAL
  Filled 2013-01-24 (×5): qty 0.25

## 2013-01-24 MED ORDER — LACTATED RINGERS IV SOLN: 500.0000 mL | INTRAVENOUS | Status: DC | PRN

## 2013-01-24 MED ORDER — CITRIC ACID-SODIUM CITRATE 334-500 MG/5ML PO SOLN: 30.0000 mL | ORAL | Status: DC | PRN

## 2013-01-24 MED ORDER — OXYCODONE-ACETAMINOPHEN 5-325 MG PO TABS: 1.0000 | ORAL_TABLET | ORAL | Status: DC | PRN

## 2013-01-24 MED ORDER — MAGNESIUM SULFATE BOLUS VIA INFUSION
4.0000 g | Freq: Once | INTRAVENOUS | Status: AC
  Administered 2013-01-24: 4 g via INTRAVENOUS
  Filled 2013-01-24: qty 500

## 2013-01-24 MED ORDER — MAGNESIUM SULFATE 40 G IN LACTATED RINGERS - SIMPLE
2.0000 g/h | INTRAVENOUS | Status: DC
  Administered 2013-01-25 – 2013-01-26 (×2): 2 g/h via INTRAVENOUS
  Filled 2013-01-24 (×3): qty 500

## 2013-01-24 MED ORDER — OXYTOCIN 40 UNITS IN LACTATED RINGERS INFUSION - SIMPLE MED: 1.0000 m[IU]/min | INTRAVENOUS | Status: DC

## 2013-01-24 MED ORDER — FLEET ENEMA 7-19 GM/118ML RE ENEM: 1.0000 | ENEMA | RECTAL | Status: DC | PRN

## 2013-01-24 MED ORDER — OXYTOCIN BOLUS FROM INFUSION
500.0000 mL | INTRAVENOUS | Status: DC
  Administered 2013-01-26: 500 mL via INTRAVENOUS

## 2013-01-24 MED ORDER — LIDOCAINE HCL (PF) 1 % IJ SOLN: 30.0000 mL | INTRAMUSCULAR | Status: DC | PRN

## 2013-01-24 MED ORDER — BUTORPHANOL TARTRATE 1 MG/ML IJ SOLN
1.0000 mg | INTRAMUSCULAR | Status: DC | PRN
  Administered 2013-01-25: 1 mg via INTRAVENOUS
  Filled 2013-01-24: qty 1

## 2013-01-24 NOTE — Progress Notes (Signed)
To 162 via w/c 

## 2013-01-24 NOTE — Progress Notes (Signed)
To LL after returning from [facility]

## 2013-01-24 NOTE — Progress Notes (Signed)
Subjective: Pt resting off and on.  No PIH s/s or SOB, chest pain, or dizziness.  No abdominal pain.  Ate some dinner that her husband brought around 2000.  Magnesium sulfate infusion continues.  Objective: BP 150/89  Pulse 97  Temp(Src) 98 F (36.7 C) (Oral)  Resp 20  Ht 5' 4.5" (1.638 m)  Wt 216 lb (97.977 kg)  BMI 36.52 kg/m2  LMP 04/20/2012 I/O last 3 completed shifts: In: 730 [P.O.:360; I.V.:370] Out: 325 [Urine:325] Total I/O In: 800 [P.O.:300; I.V.:500] Out: 1750 [Urine:1750]  FHT:  FHR: 140 bpm, variability: moderate,  accelerations:  Present,  decelerations:  Absent UC:   irregular, every 6-8 minutes; occ'l UI SVE:   Dilation: Fingertip Effacement (%): Thick Station: -3 Exam by:: Charliene Inoue, CNM  Labs: Lab Results  Component Value Date   WBC 9.1 01/24/2013   HGB 12.1 01/24/2013   HCT 35.9* 01/24/2013   MCV 78.6 01/24/2013   PLT 229 01/24/2013    Assessment / Plan: 1. [redacted]w[redacted]d 2. IOL due to PreEclampsia 3. GBS neg  Labor: induction in progress Preeclampsia:  on magnesium sulfate Fetal Wellbeing:  Category I Pain Control:  Labor support without medications I/D:  n/a Anticipated MOD:  NSVD 1. Placed 2nd cytotec dose now; continue to support as needed. 2. C/w MD prn  Odie Rauen H 01/24/2013, 11:15 PM

## 2013-01-24 NOTE — Progress Notes (Signed)
Report called to [facility] RN in BS.

## 2013-01-24 NOTE — MAU Note (Signed)
+  protein in urine today. Denies elevated BP or HA.

## 2013-01-24 NOTE — Telephone Encounter (Signed)
Spoke with pt and advised her to come in to MAU for evaluation of proteinuria.  Pt agreed to come in.

## 2013-01-25 ENCOUNTER — Encounter (HOSPITAL_COMMUNITY): Payer: Self-pay | Admitting: *Deleted

## 2013-01-25 DIAGNOSIS — O1490 Unspecified pre-eclampsia, unspecified trimester: Secondary | ICD-10-CM

## 2013-01-25 HISTORY — DX: Unspecified pre-eclampsia, unspecified trimester: O14.90

## 2013-01-25 LAB — URINE CULTURE: Culture: NO GROWTH

## 2013-01-25 MED ORDER — OXYTOCIN 40 UNITS IN LACTATED RINGERS INFUSION - SIMPLE MED
1.0000 m[IU]/min | INTRAVENOUS | Status: DC
  Administered 2013-01-25: 1 m[IU]/min via INTRAVENOUS
  Administered 2013-01-25: 6 m[IU]/min via INTRAVENOUS
  Administered 2013-01-26: 7 m[IU]/min via INTRAVENOUS
  Filled 2013-01-25: qty 1000

## 2013-01-25 MED ORDER — LABETALOL HCL 5 MG/ML IV SOLN
10.0000 mg | INTRAVENOUS | Status: DC | PRN
  Administered 2013-01-25 – 2013-01-26 (×3): 10 mg via INTRAVENOUS
  Filled 2013-01-25 (×3): qty 4

## 2013-01-25 MED ORDER — FENTANYL CITRATE 0.05 MG/ML IJ SOLN
100.0000 ug | INTRAMUSCULAR | Status: DC | PRN
  Administered 2013-01-25 – 2013-01-26 (×3): 100 ug via INTRAVENOUS
  Filled 2013-01-25 (×3): qty 2

## 2013-01-25 NOTE — Progress Notes (Signed)
Subjective: Pt sitting in high fowler's eating chicken/rice/shrimp.  Husband at bs.  Pt reports no further increase in ctxs pain.  Denies PIH s/s  Objective: BP 140/86  Pulse 89  Temp(Src) 98.2 F (36.8 C) (Oral)  Resp 20  Ht 5' 4.5" (1.638 m)  Wt 216 lb (97.977 kg)  BMI 36.52 kg/m2  SpO2 98%  LMP 04/20/2012 I/O last 3 completed shifts: In: 3125 [P.O.:1380; I.V.:1745] Out: 3300 [Urine:3300] Total I/O In: 1866.7 [P.O.:600; I.V.:1266.7] Out: 2925 [Urine:2925] .Marland Kitchen Filed Vitals:   01/25/13 1246 01/25/13 1401 01/25/13 1503 01/25/13 1603  BP: 141/86 137/81 146/90 140/86  Pulse: 91 85 90 89  Temp:    98.2 F (36.8 C)  TempSrc:    Oral  Resp: 18 20 18 20   Height:      Weight:      SpO2:       FHT:  FHR: 125 bpm, variability: moderate,  accelerations:  Present,  decelerations:  Absent UC:   regular, every 2-5 minutes SVE:   Dilation: 1.5 Effacement (%): 50 Station: -3 Exam by:: Valentina Lucks, RN  Labs: Lab Results  Component Value Date   WBC 9.1 01/24/2013   HGB 12.1 01/24/2013   HCT 35.9* 01/24/2013   MCV 78.6 01/24/2013   PLT 229 01/24/2013    Assessment / Plan: 1. [redacted]w[redacted]d 2. IOL for PreEclampsia 3. GBS neg  Labor: s/p 5 doses of cytotec for ripening Preeclampsia:  on magnesium sulfate and intake and ouput balanced Fetal Wellbeing:  Category I Pain Control:  Labor support without medications I/D:  n/a Anticipated MOD:  NSVD 1.  Pitocin started over last half hr.  Will CTO closely.  C/w MD prn. Anida Deol H 01/25/2013, 4:26 PM

## 2013-01-25 NOTE — Progress Notes (Signed)
  Subjective: Sleeping soundly.  Objective: BP 130/81  Pulse 89  Temp(Src) 98 F (36.7 C) (Oral)  Resp 20  Ht 5' 4.5" (1.638 m)  Wt 216 lb (97.977 kg)  BMI 36.52 kg/m2  SpO2 98%  LMP 04/20/2012 I/O last 3 completed shifts: In: 730 [P.O.:360; I.V.:370] Out: 325 [Urine:325] Total I/O In: 2317.5 [P.O.:1020; I.V.:1297.5] Out: 2975 [Urine:2975]  Filed Vitals:   01/25/13 0212 01/25/13 0306 01/25/13 0405 01/25/13 0500  BP: 145/83 152/85 130/81   Pulse: 98 92 89   Temp:  98 F (36.7 C)    TempSrc:  Oral    Resp: 20 20 18 20   Height:      Weight:      SpO2:         FHT:  Category 1 UC:  q 2-6 min, mild  Assessment / Plan: Induction for pre-eclampsia Next dose cytotech due at 7:30am   Marcia Bryant 01/25/2013, 5:46 AM

## 2013-01-25 NOTE — Progress Notes (Signed)
Subjective: Pt sleeping on Lt side; husband went home for night.  Pitocin on 6mu.  Pt has received no further pain medicine since ~1935.  Magnesium sulfate continues.  Pt denies PIH s/s, LOF, or VB.  Objective: BP 160/102  Pulse 92  Temp(Src) 98.4 F (36.9 C) (Oral)  Resp 20  Ht 5' 4.5" (1.638 m)  Wt 216 lb (97.977 kg)  BMI 36.52 kg/m2  SpO2 98%  LMP 04/20/2012 I/O last 3 completed shifts: In: 5854 [P.O.:2460; I.V.:3394] Out: 6925 [Urine:6925] Total I/O In: 365 [P.O.:100; I.V.:265] Out: 1175 [Urine:1175] .Marland Kitchen Filed Vitals:   01/25/13 2059 01/25/13 2159 01/25/13 2246 01/25/13 2249  BP: 156/95 156/88 162/108 160/102  Pulse: 90 90 97 92  Temp:  98.4 F (36.9 C)    TempSrc:  Oral    Resp: 18 18  20   Height:      Weight:      SpO2:       FHT:  FHR: 125 bpm, variability: moderate,  accelerations:  Present,  decelerations:  Absent UC:   regular, every 2-3 minutes SVE:   Dilation: 2.5 Effacement (%): 80 Station: -2 Exam by:: Haruto Demaria Membranes swept Labs: Lab Results  Component Value Date   WBC 9.1 01/24/2013   HGB 12.1 01/24/2013   HCT 35.9* 01/24/2013   MCV 78.6 01/24/2013   PLT 229 01/24/2013    Assessment / Plan: Induction of labor due to preeclampsia,  progressing well on pitocin  Labor: latent phase Preeclampsia:  on magnesium sulfate and labs stable Fetal Wellbeing:  Category I Pain Control:  s/p 1 dose of stadol 1935 I/D:  n/a Anticipated MOD:  NSVD 1. CTO for labor progression; AROM prn further augmentation 2. C/w MD prn.  Governor Matos H 01/25/2013, 11:16 PM

## 2013-01-25 NOTE — Progress Notes (Signed)
  Subjective: Sleeping at intervals--denies any SOB or chest pain.  Also denies HA, visual sx, or epigastric pain.  Aware of some contractions, but not in pain.  Objective: BP 152/85  Pulse 92  Temp(Src) 98 F (36.7 C) (Oral)  Resp 20  Ht 5' 4.5" (1.638 m)  Wt 216 lb (97.977 kg)  BMI 36.52 kg/m2  SpO2 98%  LMP 04/20/2012 I/O last 3 completed shifts: In: 730 [P.O.:360; I.V.:370] Out: 325 [Urine:325] Total I/O In: 2032.5 [P.O.:1020; I.V.:1012.5] Out: 2625 [Urine:2625]  Filed Vitals:   01/25/13 0103 01/25/13 0105 01/25/13 0212 01/25/13 0306  BP: 146/85  145/83 152/85  Pulse: 114 103 98 92  Temp:    98 F (36.7 C)  TempSrc:    Oral  Resp: 20  20 20   Height:      Weight:      SpO2:  98%     Chest clear in all lobes Heart RRR without murmur  FHT:  Category 1 UC:   Irregular, mild SVE:   Dilation: Fingertip Effacement (%): Thick Station: -2 Exam by:: lathem,cnm 2nd dose Cytotech placed in posterior fornix.  Assessment / Plan: Induction for pre-eclampsia Continue cervical ripening  Rakiya Krawczyk 01/25/2013, 4:01 AM

## 2013-01-25 NOTE — Progress Notes (Signed)
27 y.o. year old female,at [redacted]w[redacted]d gestation. She is being induced because of preeclampsia.  SUBJECTIVE:  Doing well. She is not feeling her contractions.  OBJECTIVE:  BP 146/90  Pulse 90  Temp(Src) 98.2 F (36.8 C) (Oral)  Resp 18  Ht 5' 4.5" (1.638 m)  Wt 216 lb (97.977 kg)  BMI 36.52 kg/m2  SpO2 98%  LMP 04/20/2012  Fetal Heart Tones:  Category 1  Contractions:          Mild  ASSESSMENT:  [redacted]w[redacted]d Weeks Pregnancy  Preeclampsia  Not in labor  PLAN:  Continue Pitocin for today. If the patient is not in labor by 9 PM, then we will place Cytotec overnight and start Pitocin again in the morning.  Check PIH labs and magnesium level in the morning.  Leonard Schwartz, M.D.

## 2013-01-25 NOTE — Progress Notes (Signed)
Subjective:  Pt sleeping on Rt side on my arrival to room, but when woke up, reports some mild ctx discomfort.  Pt's husband now back at bs.  Pt denies PIH s/s.  No VB or LOF.  Pt had epidural w/ first labor and doesn't want one this delivery, stating she had lingering back pain after first one.  Objective: BP 137/88  Pulse 89  Temp(Src) 98.2 F (36.8 C) (Oral)  Resp 20  Ht 5' 4.5" (1.638 m)  Wt 216 lb (97.977 kg)  BMI 36.52 kg/m2  SpO2 98%  LMP 04/20/2012 I/O last 3 completed shifts: In: 3125 [P.O.:1380; I.V.:1745] Out: 3300 [Urine:3300] Total I/O In: 635.4 [I.V.:635.4] Out: 1675 [Urine:1675] .Marland Kitchen Filed Vitals:   01/25/13 1021 01/25/13 1045 01/25/13 1106 01/25/13 1146  BP: 135/82 144/80 130/78 137/88  Pulse: 89 89 95 89  Temp:   98.2 F (36.8 C)   TempSrc:   Oral   Resp:  20 18 20   Height:      Weight:      SpO2:       FHT:  FHR: 125 bpm, variability: moderate,  accelerations:  Present,  decelerations:  Absent UC:   irregular, every 2-6 minutes SVE:   Dilation: 1.5 Effacement (%): 50 Station: -2 Exam by:: H. Kynlee Koenigsberg, CNM cx still longer anteriorly than posteriorly (70% at back) Labs: Lab Results  Component Value Date   WBC 9.1 01/24/2013   HGB 12.1 01/24/2013   HCT 35.9* 01/24/2013   MCV 78.6 01/24/2013   PLT 229 01/24/2013    Assessment / Plan: 1. [redacted]w[redacted]d 2. IOL for PreEclampsia 3. GBS neg  Labor: induction in progress Preeclampsia:  on magnesium sulfate and intake and ouput balanced Fetal Wellbeing:  Category I Pain Control:  Labor support without medications I/D:  n/a Anticipated MOD:  NSVD 1. Just placed fifth cytotec and will proceed w/ Pitocin per low-dose around 1600. 2. CTO closely and support as needed. 3. C/w MD prn  Akane Tessier H 01/25/2013, 12:23 PM

## 2013-01-25 NOTE — Progress Notes (Addendum)
In to see patient at RN request due to episode of labored breathing. Patient sleeping soundly at present. Respirations regular and unlabored. DTR WNL per RN  FHR Category 1 UCs irregular, mild.  Filed Vitals:   01/24/13 2303 01/24/13 2356 01/25/13 0103 01/25/13 0105  BP: 150/89 147/95 146/85   Pulse: 97 107 114 103  Temp:      TempSrc:      Resp: 20 20 20    Height:      Weight:      SpO2:    98%   Mag infusing at 2 gm/hour I/O balance = 2250 cc out since admission.  Results for orders placed during the hospital encounter of 01/24/13 (from the past 24 hour(s))  URINALYSIS, ROUTINE W REFLEX MICROSCOPIC     Status: Abnormal   Collection Time    01/24/13  3:20 PM      Result Value Range   Color, Urine YELLOW  YELLOW   APPearance CLEAR  CLEAR   Specific Gravity, Urine 1.015  1.005 - 1.030   pH 6.0  5.0 - 8.0   Glucose, UA NEGATIVE  NEGATIVE mg/dL   Hgb urine dipstick MODERATE (*) NEGATIVE   Bilirubin Urine NEGATIVE  NEGATIVE   Ketones, ur NEGATIVE  NEGATIVE mg/dL   Protein, ur >161 (*) NEGATIVE mg/dL   Urobilinogen, UA 0.2  0.0 - 1.0 mg/dL   Nitrite NEGATIVE  NEGATIVE   Leukocytes, UA NEGATIVE  NEGATIVE  URINE MICROSCOPIC-ADD ON     Status: Abnormal   Collection Time    01/24/13  3:20 PM      Result Value Range   Squamous Epithelial / LPF FEW (*) RARE   WBC, UA 11-20  <3 WBC/hpf   RBC / HPF 11-20  <3 RBC/hpf   Bacteria, UA MANY (*) RARE   Urine-Other MUCOUS PRESENT    CBC     Status: Abnormal   Collection Time    01/24/13  5:25 PM      Result Value Range   WBC 9.1  4.0 - 10.5 K/uL   RBC 4.57  3.87 - 5.11 MIL/uL   Hemoglobin 12.1  12.0 - 15.0 g/dL   HCT 09.6 (*) 04.5 - 40.9 %   MCV 78.6  78.0 - 100.0 fL   MCH 26.5  26.0 - 34.0 pg   MCHC 33.7  30.0 - 36.0 g/dL   RDW 81.1  91.4 - 78.2 %   Platelets 229  150 - 400 K/uL  COMPREHENSIVE METABOLIC PANEL     Status: Abnormal   Collection Time    01/24/13  6:15 PM      Result Value Range   Sodium 138  135 - 145 mEq/L    Potassium 4.0  3.5 - 5.1 mEq/L   Chloride 104  96 - 112 mEq/L   CO2 22  19 - 32 mEq/L   Glucose, Bld 65 (*) 70 - 99 mg/dL   BUN 7  6 - 23 mg/dL   Creatinine, Ser 9.56  0.50 - 1.10 mg/dL   Calcium 8.8  8.4 - 21.3 mg/dL   Total Protein 5.6 (*) 6.0 - 8.3 g/dL   Albumin 2.0 (*) 3.5 - 5.2 g/dL   AST 17  0 - 37 U/L   ALT 8  0 - 35 U/L   Alkaline Phosphatase 160 (*) 39 - 117 U/L   Total Bilirubin 0.6  0.3 - 1.2 mg/dL   GFR calc non Af Amer >90  >90 mL/min  GFR calc Af Amer >90  >90 mL/min  LACTATE DEHYDROGENASE     Status: Abnormal   Collection Time    01/24/13  6:15 PM      Result Value Range   LDH 276 (*) 94 - 250 U/L  RPR     Status: None   Collection Time    01/24/13  6:15 PM      Result Value Range   RPR NON REACTIVE  NON REACTIVE  URIC ACID     Status: None   Collection Time    01/24/13  6:15 PM      Result Value Range   Uric Acid, Serum 6.0  2.4 - 7.0 mg/dL  TYPE AND SCREEN     Status: None   Collection Time    01/24/13  6:15 PM      Result Value Range   ABO/RH(D) O POS     Antibody Screen NEG     Sample Expiration 01/27/2013    ABO/RH     Status: None   Collection Time    01/24/13  6:15 PM      Result Value Range   ABO/RH(D) O POS      Next Cytotech due at 2am. Will reassess status then.  Nigel Bridgeman, CNM 01/25/13 1:35a

## 2013-01-25 NOTE — Progress Notes (Signed)
Subjective: Pt started getting more uncomfortable between 6-7pm.  Request IV pain medication and received IV Stadol at 1938.  Pitocin on 5mu.  Denies PIH s/s.  Magnesium sulfate continues.  Husband remains at bs, currently on computer.  Objective: BP 157/92  Pulse 94  Temp(Src) 98.2 F (36.8 C) (Oral)  Resp 20  Ht 5' 4.5" (1.638 m)  Wt 216 lb (97.977 kg)  BMI 36.52 kg/m2  SpO2 98%  LMP 04/20/2012 I/O last 3 completed shifts: In: 5854 [P.O.:2460; I.V.:3394] Out: 6925 [Urine:6925]   .Marland Kitchen Filed Vitals:   01/25/13 1744 01/25/13 1805 01/25/13 1847 01/25/13 1932  BP: 143/86 152/89 157/92   Pulse: 92 96 94   Temp:      TempSrc:      Resp:  20 18 20   Height:      Weight:      SpO2:       FHT:  FHR: 125 bpm, variability: moderate,  accelerations:  Present,  decelerations:  Absent UC:   regular, every 2-4 minutes SVE:   Dilation: 1.5 Effacement (%): 70 Station: -3 Exam by:: H Deanndra Kirley CNM Membranes swept Labs: Lab Results  Component Value Date   WBC 9.1 01/24/2013   HGB 12.1 01/24/2013   HCT 35.9* 01/24/2013   MCV 78.6 01/24/2013   PLT 229 01/24/2013    Assessment / Plan: 1. [redacted]w[redacted]d 2. IOL for PreEclampsia 3. GBS neg  Labor: latent labor Preeclampsia:  on magnesium sulfate and labs stable Fetal Wellbeing:  Category I Pain Control:  stadolx1 I/D:  n/a Anticipated MOD:  NSVD 1. Will continue w/ pitocin at present, since pt just now starting to get uncomfortable. 2. If no change by next check, consider switching back to cytotec tonight. 3. C/w MD prn Sheran Newstrom H 01/25/2013, 8:19 PM

## 2013-01-25 NOTE — Progress Notes (Signed)
Subjective: Pt awoke easily on arrival to room.  Slept well overnight.  No PIH s/s.  No pain at present.  Magnesium sulfate continues.  Objective: BP 156/91  Pulse 90  Temp(Src) 98.3 F (36.8 C) (Oral)  Resp 18  Ht 5' 4.5" (1.638 m)  Wt 216 lb (97.977 kg)  BMI 36.52 kg/m2  SpO2 98%  LMP 04/20/2012 I/O last 3 completed shifts: In: 3125 [P.O.:1380; I.V.:1745] Out: 3300 [Urine:3300] Total I/O In: 260.4 [I.V.:260.4] Out: 625 [Urine:625] .Marland Kitchen Filed Vitals:   01/25/13 0605 01/25/13 0705 01/25/13 0753 01/25/13 0758  BP:  155/86 156/91   Pulse:  88 90   Temp:  98.3 F (36.8 C)    TempSrc:      Resp: 20 20  18   Height:      Weight:      SpO2:       FHT:  FHR: 125 bpm, variability: moderate,  accelerations:  Present,  decelerations:  Absent UC:   irregular, every 3-6 minutes SVE:   Dilation: Fingertip Effacement (%): Thick Station: -2 Exam by:: Marcia Bryant, cnm (Marcia Bryant, cnm) cx posterior; anterior portion of cx longer than posterior portion.   Labs: Lab Results  Component Value Date   WBC 9.1 01/24/2013   HGB 12.1 01/24/2013   HCT 35.9* 01/24/2013   MCV 78.6 01/24/2013   PLT 229 01/24/2013    Assessment / Plan: 1. [redacted]w[redacted]d 2. PreEclampsia 3. GBS neg  Labor: induction in progress Preeclampsia:  on magnesium sulfate Fetal Wellbeing:  Category I Pain Control:  Labor support without medications I/D:  n/a Anticipated MOD:  NSVD 1. Just placed 4th dose of cytotec.  Will CTO closely.  As cervix thins more, will switch to pitocin. 2. C/w MD prn. Marcia Bryant H 01/25/2013, 8:31 AM

## 2013-01-25 NOTE — Progress Notes (Signed)
Pt seems to be laboring to breathe when rn enters room.  Pt reports that it feels like she is having a hard time breathing and that she is uncomfortable in the bed.  Pulse ox applied, o2 sats 99%. Lung sounds auscultated and some wheezing heard in left lung.  CNM on way to unit will report findings to her.

## 2013-01-26 ENCOUNTER — Inpatient Hospital Stay (HOSPITAL_COMMUNITY): Payer: Medicaid Other | Admitting: Anesthesiology

## 2013-01-26 ENCOUNTER — Encounter (HOSPITAL_COMMUNITY): Payer: Self-pay | Admitting: Anesthesiology

## 2013-01-26 LAB — COMPREHENSIVE METABOLIC PANEL
ALT: 8 U/L (ref 0–35)
AST: 19 U/L (ref 0–37)
Alkaline Phosphatase: 159 U/L — ABNORMAL HIGH (ref 39–117)
CO2: 21 mEq/L (ref 19–32)
Calcium: 8.3 mg/dL — ABNORMAL LOW (ref 8.4–10.5)
GFR calc non Af Amer: >90 mL/min (ref >90)
Potassium: 3.9 mEq/L (ref 3.5–5.1)
Sodium: 131 mEq/L — ABNORMAL LOW (ref 135–145)
Total Protein: 5.1 g/dL — ABNORMAL LOW (ref 6.0–8.3)

## 2013-01-26 LAB — CBC
Hemoglobin: 13.2 g/dL (ref 12.0–15.0)
MCH: 27 pg (ref 26.0–34.0)
MCH: 26.8 pg (ref 26.0–34.0)
MCV: 78.1 fL (ref 78.0–100.0)
Platelets: 212 10*3/uL (ref 150–400)
RBC: 4.89 MIL/uL (ref 3.87–5.11)
RBC: 4.66 MIL/uL (ref 3.87–5.11)

## 2013-01-26 MED ORDER — LACTATED RINGERS IV SOLN: 500.0000 mL | Freq: Once | INTRAVENOUS | Status: DC

## 2013-01-26 MED ORDER — IBUPROFEN 600 MG PO TABS
600.0000 mg | ORAL_TABLET | Freq: Four times a day (QID) | ORAL | Status: DC
  Administered 2013-01-26 – 2013-01-28 (×8): 600 mg via ORAL
  Filled 2013-01-26 (×8): qty 1

## 2013-01-26 MED ORDER — OXYCODONE-ACETAMINOPHEN 5-325 MG PO TABS
1.0000 | ORAL_TABLET | ORAL | Status: DC | PRN
  Administered 2013-01-27: 2 via ORAL
  Filled 2013-01-26: qty 2

## 2013-01-26 MED ORDER — MAGNESIUM HYDROXIDE 400 MG/5ML PO SUSP
30.0000 mL | ORAL | Status: DC | PRN
  Filled 2013-01-26: qty 30

## 2013-01-26 MED ORDER — LACTATED RINGERS IV SOLN
INTRAVENOUS | Status: DC
  Administered 2013-01-26 (×2): via INTRAVENOUS

## 2013-01-26 MED ORDER — PHENYLEPHRINE 40 MCG/ML (10ML) SYRINGE FOR IV PUSH (FOR BLOOD PRESSURE SUPPORT): 80.0000 ug | PREFILLED_SYRINGE | INTRAVENOUS | Status: DC | PRN

## 2013-01-26 MED ORDER — DIPHENHYDRAMINE HCL 50 MG/ML IJ SOLN: 12.5000 mg | INTRAMUSCULAR | Status: DC | PRN

## 2013-01-26 MED ORDER — EPHEDRINE 5 MG/ML INJ: 10.0000 mg | INTRAVENOUS | Status: DC | PRN

## 2013-01-26 MED ORDER — SIMETHICONE 80 MG PO CHEW: 80.0000 mg | CHEWABLE_TABLET | ORAL | Status: DC | PRN

## 2013-01-26 MED ORDER — ONDANSETRON HCL 4 MG PO TABS: 4.0000 mg | ORAL_TABLET | ORAL | Status: DC | PRN

## 2013-01-26 MED ORDER — WITCH HAZEL-GLYCERIN EX PADS: 1.0000 "application " | MEDICATED_PAD | CUTANEOUS | Status: DC | PRN

## 2013-01-26 MED ORDER — MEASLES, MUMPS & RUBELLA VAC ~~LOC~~ INJ
0.5000 mL | INJECTION | Freq: Once | SUBCUTANEOUS | Status: DC
  Filled 2013-01-26: qty 0.5

## 2013-01-26 MED ORDER — PRENATAL MULTIVITAMIN CH
1.0000 | ORAL_TABLET | Freq: Every day | ORAL | Status: DC
  Administered 2013-01-26 – 2013-01-28 (×3): 1 via ORAL
  Filled 2013-01-26 (×3): qty 1

## 2013-01-26 MED ORDER — TETANUS-DIPHTH-ACELL PERTUSSIS 5-2.5-18.5 LF-MCG/0.5 IM SUSP
0.5000 mL | Freq: Once | INTRAMUSCULAR | Status: AC
  Administered 2013-01-28: 0.5 mL via INTRAMUSCULAR
  Filled 2013-01-26 (×2): qty 0.5

## 2013-01-26 MED ORDER — DIPHENHYDRAMINE HCL 25 MG PO CAPS: 25.0000 mg | ORAL_CAPSULE | Freq: Four times a day (QID) | ORAL | Status: DC | PRN

## 2013-01-26 MED ORDER — DIBUCAINE 1 % RE OINT: 1.0000 "application " | TOPICAL_OINTMENT | RECTAL | Status: DC | PRN

## 2013-01-26 MED ORDER — EPHEDRINE 5 MG/ML INJ
10.0000 mg | INTRAVENOUS | Status: DC | PRN
  Filled 2013-01-26 (×2): qty 4

## 2013-01-26 MED ORDER — ONDANSETRON HCL 4 MG/2ML IJ SOLN: 4.0000 mg | INTRAMUSCULAR | Status: DC | PRN

## 2013-01-26 MED ORDER — MEDROXYPROGESTERONE ACETATE 150 MG/ML IM SUSP: 150.0000 mg | INTRAMUSCULAR | Status: DC | PRN

## 2013-01-26 MED ORDER — SENNOSIDES-DOCUSATE SODIUM 8.6-50 MG PO TABS
2.0000 | ORAL_TABLET | Freq: Every day | ORAL | Status: DC
  Administered 2013-01-26: 2 via ORAL

## 2013-01-26 MED ORDER — FENTANYL 2.5 MCG/ML BUPIVACAINE 1/10 % EPIDURAL INFUSION (WH - ANES)
14.0000 mL/h | INTRAMUSCULAR | Status: DC | PRN
  Filled 2013-01-26: qty 125

## 2013-01-26 MED ORDER — ZOLPIDEM TARTRATE 5 MG PO TABS: 5.0000 mg | ORAL_TABLET | Freq: Every evening | ORAL | Status: DC | PRN

## 2013-01-26 MED ORDER — LANOLIN HYDROUS EX OINT: TOPICAL_OINTMENT | CUTANEOUS | Status: DC | PRN

## 2013-01-26 MED ORDER — PHENYLEPHRINE 40 MCG/ML (10ML) SYRINGE FOR IV PUSH (FOR BLOOD PRESSURE SUPPORT)
80.0000 ug | PREFILLED_SYRINGE | INTRAVENOUS | Status: DC | PRN
  Filled 2013-01-26: qty 5

## 2013-01-26 MED ORDER — BENZOCAINE-MENTHOL 20-0.5 % EX AERO: 1.0000 "application " | INHALATION_SPRAY | CUTANEOUS | Status: DC | PRN

## 2013-01-26 MED ORDER — LIDOCAINE HCL (PF) 1 % IJ SOLN
INTRAMUSCULAR | Status: DC | PRN
  Administered 2013-01-26 (×2): 8 mL

## 2013-01-26 MED ORDER — FENTANYL 2.5 MCG/ML BUPIVACAINE 1/10 % EPIDURAL INFUSION (WH - ANES)
INTRAMUSCULAR | Status: DC | PRN
  Administered 2013-01-26: 14 mL/h via EPIDURAL

## 2013-01-26 NOTE — Progress Notes (Signed)
UR chart review completed.  

## 2013-01-26 NOTE — H&P (Signed)
Marcia Bryant is a 28 y.o. female, G3P2011 at 109w2d weeks was asked to come to MAU for evaluation following a 24 hour urine protein of 6608.  In MAU a cath UA was collected and showed > 300 protein, at which point she was admitted for IOL and Mag Sulfate prophylaxis.  Pt has a recent history of MO poisoning.  Patient Active Problem List  Diagnosis  . Normal pregnancy  . Late prenatal care  . Decreased fetal movement  . Carbon monoxide poisoning  . Proteinuria complicating pregnancy  . Preeclampsia  . NSVD (normal spontaneous vaginal delivery)  . Nuchal cord with compression, delivered, current hospitalization  . Shoulder (girdle) dystocia during labor and deliver, delivered    History of present pregnancy: Patient entered care at 14 weeks.   EDC of 02/05/13 was established by [redacted]w[redacted]d U/S.   Anatomy scan:  19 weeks, with normal findings and an anterior placenta.   Additional [place] evaluations:  none.   Significant prenatal events:  See HPI above.  At [redacted]w[redacted]d Carbon MO exposure with evaluation.  At [redacted]w[redacted]d edema, 2+ edema and 1+ protein -- PIH labs and 24 hour urine collected. Last evaluation:  [redacted]w[redacted]d  OB History   Grav Para Term Preterm Abortions TAB SAB Ect Mult Living   3 2 2  1  1   2      Past Medical History  Diagnosis Date  . Yeast infection   . Bacterial vaginosis    History reviewed. No pertinent past surgical history. Family History: family history includes Hypertension in her father and paternal grandmother. Social History:  reports that she has never smoked. She has never used smokeless tobacco. She reports that she does not drink alcohol or use illicit drugs.  Husband at [facility] and supportive.   Prenatal Transfer Tool  Maternal Diabetes: No Genetic Screening: Normal Maternal Ultrasounds/Referrals: Normal Fetal Ultrasounds or other Referrals:  None Maternal Substance Abuse:  No Significant Maternal Medications:  None Significant Maternal Lab Results: Lab values include:  Group B Strep negative, Other: Carbon MO exposure and elevated protein  ROS:  See HPI  No Known Allergies   Blood pressure 142/87, pulse 104, temperature 97.8 F (36.6 C), temperature source Oral, resp. rate 18, height 5\' 5"  (1.651 m), weight 206 lb (93.441 kg), last menstrual period 04/20/2012, SpO2 98.00%, unknown if currently breastfeeding.  Chest clear Heart RRR without murmur Abd gravid, NT Pelvic: 1 / 20 / -3 -- Not changed since last check in off Ext: WNL  FHR: Cat 1 UCs:  Occasional  Prenatal labs: ABO, Rh: --/--/O POS, O POS (03/18 1815) Antibody: NEG (03/18 1815) Rubella:    RPR: NON REACTIVE (03/18 1815)  HBsAg: NEGATIVE (10/01 1105)  HIV: NON REACTIVE (10/01 1105)  GBS: NEGATIVE (03/05 1423) Sickle cell/Hgb electrophoresis:  Normal Pap:  Normal GC:  Neg Chlamydia:  Neg Genetic screenings:  Normal Glucola:  103    Assessment/Plan: IUP at 105w2d Preeclampsia  C/w Dr. Stefano Gaul Admit to L&D IOL - Cytotec for cervical ripening Mag Sulfate   Haroldine Laws CNM, MSN 01/26/2013, 12:52 PM

## 2013-01-26 NOTE — Anesthesia Preprocedure Evaluation (Signed)

## 2013-01-26 NOTE — Anesthesia Procedure Notes (Signed)
Epidural Patient location during procedure: OB Start time: 01/26/2013 2:40 AM End time: 01/26/2013 2:47 AM  Staffing Anesthesiologist: Sandrea Hughs Performed by: anesthesiologist   Preanesthetic Checklist Completed: patient identified, site marked, surgical consent, pre-op evaluation, timeout performed, IV checked, risks and benefits discussed and monitors and equipment checked  Epidural Patient position: sitting Prep: site prepped and draped and DuraPrep Patient monitoring: continuous pulse ox and blood pressure Approach: midline Injection technique: LOR air  Needle:  Needle type: Tuohy  Needle gauge: 17 G Needle length: 9 cm and 9 Needle insertion depth: 6 cm Catheter type: closed end flexible Catheter size: 19 Gauge Catheter at skin depth: 11 cm Test dose: negative and Other  Assessment Sensory level: T9 Events: blood not aspirated, injection not painful, no injection resistance, negative IV test and no paresthesia  Additional Notes Reason for block:procedure for pain

## 2013-01-26 NOTE — Progress Notes (Signed)
Subjective: Difficulty resting now secondary to pain.  Received last dose of Fentanyl (2 doses total) at 0023.  Pt also given IV Labetalol around 2325 for increased BP.  Pt is more groggy since 3 total doses of IV pain medicine.    Objective: BP 140/77  Pulse 80  Temp(Src) 98.4 F (36.9 C) (Oral)  Resp 18  Ht 5' 4.5" (1.638 m)  Wt 216 lb (97.977 kg)  BMI 36.52 kg/m2  SpO2 98%  LMP 04/20/2012 I/O last 3 completed shifts: In: 5854 [P.O.:2460; I.V.:3394] Out: 6925 [Urine:6925] Total I/O In: 1000.4 [P.O.:200; I.V.:800.4] Out: 1175 [Urine:1175] .Marland Kitchen Filed Vitals:   01/26/13 0004 01/26/13 0006 01/26/13 0008 01/26/13 0025  BP: 163/94 164/100 155/102 140/77  Pulse: 88 89 88 80  Temp:      TempSrc:      Resp: 20   18  Height:      Weight:      SpO2:       FHT:  FHR: 125 bpm, variability: moderate,  accelerations:  Present,  decelerations:  Absent UC:   regular, every 1.5-3 minutes SVE:   Dilation: 3 Effacement (%): 80 Station: -2 Exam by:: Larose Kells RN Deferred exam at present Labs: Lab Results  Component Value Date   WBC 9.1 01/24/2013   HGB 12.1 01/24/2013   HCT 35.9* 01/24/2013   MCV 78.6 01/24/2013   PLT 229 01/24/2013    Assessment / Plan: Induction of labor due to preeclampsia,  progressing well on pitocin  Labor: Progressing on Pitocin, will continue to increase then AROM Preeclampsia:  on magnesium sulfate and intake and ouput balanced Fetal Wellbeing:  Category I Pain Control:  Fentanyl I/D:  n/a Anticipated MOD:  NSVD 1. Continue to support as needed.  Pt declines epidural presently. 2. Pitocin on 7mu and will continue to titrate prn. 3. C/w MD prn  Marcia Bryant H 01/26/2013, 1:05 AM

## 2013-01-26 NOTE — Progress Notes (Signed)
Subjective: SROM at 0111--clear.  Pt requested epidural shortly thereafter.  Epidural placed around 0240.  Pt's ctx pain has decreased but still feels significant vaginal pressure and still moaning and writhing during ctxs.  Pt sleeps in between ctxs.  Pitocin on 7mu.  Objective: BP 160/92  Pulse 95  Temp(Src) 98.4 F (36.9 C) (Oral)  Resp 20  Ht 5' 4.5" (1.638 m)  Wt 216 lb (97.977 kg)  BMI 36.52 kg/m2  SpO2 98%  LMP 04/20/2012 I/O last 3 completed shifts: In: 5854 [P.O.:2460; I.V.:3394] Out: 6925 [Urine:6925] Total I/O In: 1000.4 [P.O.:200; I.V.:800.4] Out: 1450 [Urine:1450] .Marland Kitchen Filed Vitals:   01/26/13 0316 01/26/13 0317 01/26/13 0321 01/26/13 0322  BP: 151/98  160/92   Pulse: 97 105 94 95  Temp:      TempSrc:      Resp:   20   Height:      Weight:      SpO2:  100%  98%   FHT:  FHR: 140 bpm, variability: moderate,  accelerations:  Present,  decelerations:  Absent UC:   regular, every 2-4 minutes; couplets at times SVE:   Dilation: 4.5 Effacement (%): 80 Station: -1 Exam by:: H Javana Schey CNM Small amt of bloody show  Labs: Lab Results  Component Value Date   WBC 12.5* 01/26/2013   HGB 13.2 01/26/2013   HCT 38.2 01/26/2013   MCV 78.1 01/26/2013   PLT 231 01/26/2013    Assessment / Plan: 1. [redacted]w[redacted]d 2. IOL for PreEclampsia 3. GBS neg  Labor: active labor Preeclampsia:  on magnesium sulfate and labs stable Fetal Wellbeing:  Category I Pain Control:  Epidural I/D:  n/a Anticipated MOD:  NSVD 1. PCAE bolused twice; if no improvement after 3rd dose, will call anesthesia back for redose or adjustment. 2. CTO closely. 3. C/w MD prn.  Marcia Bryant H 01/26/2013, 3:26 AM

## 2013-01-26 NOTE — Anesthesia Postprocedure Evaluation (Signed)
  Anesthesia Post-op Note  Patient: Marcia Bryant  Procedure(s) Performed: * No procedures listed *  Patient Location: ICU  Anesthesia Type:Epidural  Level of Consciousness: awake  Airway and Oxygen Therapy: Patient Spontanous Breathing  Post-op Pain: mild  Post-op Assessment: Patient's Cardiovascular Status Stable and Respiratory Function Stable  Post-op Vital Signs: stable  Complications: No apparent anesthesia complications

## 2013-01-27 LAB — CBC
HCT: 32.7 % — ABNORMAL LOW (ref 36.0–46.0)
Hemoglobin: 11.2 g/dL — ABNORMAL LOW (ref 12.0–15.0)
MCH: 26.6 pg (ref 26.0–34.0)
MCHC: 34.3 g/dL (ref 30.0–36.0)
MCV: 77.7 fL — ABNORMAL LOW (ref 78.0–100.0)
Platelets: 215 10*3/uL (ref 150–400)
RBC: 4.21 MIL/uL (ref 3.87–5.11)
RDW: 14 % (ref 11.5–15.5)
WBC: 17.5 10*3/uL — ABNORMAL HIGH (ref 4.0–10.5)

## 2013-01-27 LAB — COMPREHENSIVE METABOLIC PANEL
ALT: 8 U/L (ref 0–35)
Alkaline Phosphatase: 120 U/L — ABNORMAL HIGH (ref 39–117)
CO2: 24 mEq/L (ref 19–32)
Calcium: 7.7 mg/dL — ABNORMAL LOW (ref 8.4–10.5)
Chloride: 104 mEq/L (ref 96–112)
GFR calc Af Amer: >90 mL/min (ref >90)
GFR calc non Af Amer: >90 mL/min (ref >90)
Glucose, Bld: 76 mg/dL (ref 70–99)
Sodium: 137 mEq/L (ref 135–145)
Total Bilirubin: 0.4 mg/dL (ref 0.3–1.2)

## 2013-01-27 LAB — LACTATE DEHYDROGENASE: LDH: 357 U/L — ABNORMAL HIGH (ref 94–250)

## 2013-01-27 MED ORDER — HYDROCHLOROTHIAZIDE 25 MG PO TABS
25.0000 mg | ORAL_TABLET | Freq: Every day | ORAL | Status: DC
  Administered 2013-01-27 – 2013-01-28 (×2): 25 mg via ORAL
  Filled 2013-01-27 (×3): qty 1

## 2013-01-27 NOTE — Progress Notes (Signed)
Pt transferred ambulatory to RM #[id number]

## 2013-01-27 NOTE — Progress Notes (Signed)
Clinical Social Work Department PSYCHOSOCIAL ASSESSMENT - MATERNAL/CHILD 01/27/2013  Patient:  Marcia Bryant, Marcia Bryant  Account Number:  [id number]  Admit Date:  01/24/2013  Marcia Bryant:   Marcia Bryant    Clinical Social Worker:  Lulu Riding, LCSW   Date/Time:  01/27/2013 02:00 PM  Date Referred:  01/27/2013   Referral source  NICU     Referred reason  NICU   Other referral source:    I:  FAMILY / HOME ENVIRONMENT Child's legal guardian:  PARENT  Guardian - Bryant Guardian - Age Guardian - Address  Marcia Bryant [address] Marcia [place], [place], [place] [address]  Marcia Bryant  [facility]   Other household support members/support persons Bryant Relationship DOB  Marcia Bryant SON 2   Other support:   MOB has limited supports.  She and FOB are not in a relationship at this time, but he is involved.  MOB moved here 4 years ago, but states her family still lives in [place].    II  PSYCHOSOCIAL DATA Information Source:  Patient Interview  Event organiser Employment:   MOB does not work.  FOB works at a Presenter, broadcasting resources:  [employer] If [employer]:  [employer] Other  [employer]   School / Grade:   Maternity Care Coordinator / Child Services Coordination / Early Interventions:  Cultural issues impacting care:   MOB is originally from [place].  She speaks [place] fluently.    III  STRENGTHS Strengths  Compliance with medical plan  Adequate Resources  Other - See comment  Understanding of illness   Strength comment:  Pediatric follow up will be with Marcia Bryant at [facility].   IV  RISK FACTORS AND CURRENT PROBLEMS Current Problem:  YES   Risk Factor & Current Problem Patient Issue Family Issue Risk Factor / Current Problem Comment  Financial Resources N N    N N     V  SOCIAL WORK ASSESSMENT  CSW met with MOB in her third floor room to introduce myself and complete assessment for NICU  admission.  MOB was pleasant and welcomed CSW into her room.  She reports feeling much better physically and states she thinks she is coping well with baby's situation, although it is sad that he is not able to be with her in her room.  She has a 26 year old at home, but this is her first experience with having a baby in the NICU.  She seems to have a good understanding of the situation and states no concerns or questions regarding his care at this time.  MOB states no issues with transportation once she is discharged if baby has to remain in the hospital.  She reports having a baby bed at home, but is in need of a car seat and clothes.  CSW made appropriate referrals.  MOB then asked if there is any financial assistance to help her family.  MOB states she is not working.  CSW advised she apply for Work First at [employer] [facility].  CSW discussed PPD signs and symptoms and MOB states she feels comfortable talking with her doctor if symptoms arise.  She reports no issues with PPD after her first child.  CSW explained support services offered by NICU CSW and gave contact information.  MOB seemed very appreciative of CSW's visit.   VI SOCIAL WORK PLAN Social Work Plan  Psychosocial Support/Ongoing Assessment of Needs   Type of  pt/family education:   PPD signs and symptoms   If child protective services report - county:   If child protective services report - date:   Information/referral to community resources comment:   [facility] or [employer] for a car seat.   Other social work plan:

## 2013-01-27 NOTE — Progress Notes (Addendum)
Post Partum Day 1 Subjective: no complaints, up ad lib, voiding, tolerating PO, + flatus and pt sitting up at bedside, and husband just arrived w/ pt's breakfast.  Newborn remains in [place] on [place], and on IV; no po yet.  No PIH s/s. She feels LE edema may be slightly improved. Magnesium sulfate d/c'd around 0600 this AM. VB lighter this AM.  Objective: Blood pressure 149/98, pulse 87, temperature 98.3 F (36.8 C), temperature source Oral, resp. rate 18, height 5\' 5"  (1.651 m), weight 207 lb 6.4 oz (94.076 kg), last menstrual period 04/20/2012, SpO2 99.00%, unknown if currently breastfeeding. .. Filed Vitals:   01/27/13 0610 01/27/13 0700 01/27/13 0807 01/27/13 0808  BP:  125/93 149/98   Pulse: 81 82  87  Temp:   98.3 F (36.8 C)   TempSrc:   Oral   Resp:    18  Height:      Weight: 207 lb 6.4 oz (94.076 kg)     SpO2: 99% 99%  99%   Physical Exam:  General: alert, cooperative, appears stated age and no distress Lochia: appropriate Uterine Fundus: firm Incision: n/a DVT Evaluation: No evidence of DVT seen on physical exam. Negative Homan's sign. Calf/Ankle edema is present. 2+ BLE pitting edema, and pedal/ankle edema.  No clonus.  DTRs 1+ BLE  .Marland Kitchen Results for orders placed during the hospital encounter of 01/24/13 (from the past 24 hour(s))  CBC     Status: Abnormal   Collection Time    01/27/13  5:35 AM      Result Value Range   WBC 17.5 (*) 4.0 - 10.5 K/uL   RBC 4.21  3.87 - 5.11 MIL/uL   Hemoglobin 11.2 (*) 12.0 - 15.0 g/dL   HCT 14.7 (*) 82.9 - 56.2 %   MCV 77.7 (*) 78.0 - 100.0 fL   MCH 26.6  26.0 - 34.0 pg   MCHC 34.3  30.0 - 36.0 g/dL   RDW 13.0  86.5 - 78.4 %   Platelets 215  150 - 400 K/uL  COMPREHENSIVE METABOLIC PANEL     Status: Abnormal   Collection Time    01/27/13  5:35 AM      Result Value Range   Sodium 137  135 - 145 mEq/L   Potassium 4.0  3.5 - 5.1 mEq/L   Chloride 104  96 - 112 mEq/L   CO2 24  19 - 32 mEq/L   Glucose, Bld 76  70 - 99 mg/dL   BUN 5  (*) 6 - 23 mg/dL   Creatinine, Ser 6.96  0.50 - 1.10 mg/dL   Calcium 7.7 (*) 8.4 - 10.5 mg/dL   Total Protein 4.5 (*) 6.0 - 8.3 g/dL   Albumin 1.5 (*) 3.5 - 5.2 g/dL   AST 19  0 - 37 U/L   ALT 8  0 - 35 U/L   Alkaline Phosphatase 120 (*) 39 - 117 U/L   Total Bilirubin 0.4  0.3 - 1.2 mg/dL   GFR calc non Af Amer >90  >90 mL/min   GFR calc Af Amer >90  >90 mL/min  LACTATE DEHYDROGENASE     Status: Abnormal   Collection Time    01/27/13  5:35 AM      Result Value Range   LDH 357 (*) 94 - 250 U/L  URIC ACID     Status: None   Collection Time    01/27/13  5:35 AM      Result Value Range   Uric  Acid, Serum 6.1  2.4 - 7.0 mg/dL    Assessment/Plan: 1. Marcia Bryant 2. PreEclampsia--BP labile, mainly elevated diastolics  3. Pumping    1. Transfer to floor  2. Will CTO BP closely and c/w dr. Stefano Gaul r/e further antihypertensives prn if remain elevated (on nothing currently) 3. LDH elevated, but other labs stable  3. Support given r/e newborn in [place]  4. Anticipate d/c home tomorrow  5. Lactational support   LOS: 3 days   STEELMAN,CANDICE H 01/27/2013, 8:22 AM   I interviewed and examined the patient this morning. The patient will begin HCTZ 25 mg daily. I agree with discharge tomorrow.  Dr. Stefano Gaul

## 2013-01-28 MED ORDER — IBUPROFEN 800 MG PO TABS: 800.0000 mg | ORAL_TABLET | Freq: Three times a day (TID) | ORAL | Status: DC | PRN

## 2013-01-28 MED ORDER — HYDROCHLOROTHIAZIDE 25 MG PO TABS: 25.0000 mg | ORAL_TABLET | Freq: Every day | ORAL | Status: DC

## 2013-01-28 MED ORDER — FERROUS SULFATE 325 (65 FE) MG PO TABS: 325.0000 mg | ORAL_TABLET | Freq: Two times a day (BID) | ORAL | Status: DC

## 2013-01-28 MED ORDER — OXYCODONE-ACETAMINOPHEN 5-325 MG PO TABS: 1.0000 | ORAL_TABLET | ORAL | Status: DC | PRN

## 2013-01-28 NOTE — Progress Notes (Signed)
Discharge instructions provided to patient and significant other at bedside.  Follow up appointments, medications, activity instructions, when to call the doctor and community resources discussed.  No questions at this time.  Patient left unit in stable condition with all personal belongings accompanied by staff.  Osvaldo Angst, RN---------------

## 2013-01-28 NOTE — Discharge Summary (Signed)
Vaginal Delivery Discharge Summary  Marcia Bryant  DOB:    [birth year] MRN:    [id number] CSN:    [id number]  Date of admission:                  01/24/2013  Date of discharge:                   01/28/2013  Procedures this admission:  01/26/2013:  Normal Spontaneous Vaginal Delivery by French Ana, Certified Nurse Midwife  Newborn Data:  Live born female  Birth Weight: 8 lb 9.8 oz (3907 g) APGAR: 4, 6 The infant was observed in the NICU for 2 days but discharged on postpartum day #2. Circ: Plan for in the office  Home with mother. Name: Marcia Bryant  History of Present Illness:  Ms. Marcia Serpe is a 27 y.o. female, [id number], who presents at [redacted]w[redacted]d weeks gestation. The patient has been followed at the [facility] division [facility]. She was admitted induction of labor. Her pregnancy has been complicated by: Carbon monoxide poisoning in the third trimester. The patient developed preeclampsia. Her 24-hour urine protein was greater than 6000 mg per 24 hours.  Hospital course:  The patient was admitted for induction of labor. Magnesium was started. She was given Cytotec on the night of admission. Pitocin was started the following morning. She began to labor late in the evening.   Her labor was not complicated. Her delivery was complicated. A tight nuchal cord was present. The baby had a shoulder dystocia. The pediatric team was called for delivery. The infant was taken to the neonatal intensive care unit where the baby did well. The baby was discharged from the unit and discharged to home on postpartum day #2. Her postpartum course was complicated. The patient was transferred to the adult intensive care unit after delivery for 24 hours of additional magnesium. She was started on hydrochlorothiazide 25 mg daily. She did well postpartum. She denies headaches, blurred vision, and right upper quadrant tenderness on the day of  discharge. She was discharged to home on postpartum day 2 doing well.  Feeding:  breast  Contraception:  Undecided  Discharge hemoglobin:  Hemoglobin  Date Value Range Status  01/27/2013 11.2* 12.0 - 15.0 g/dL Final     HCT  Date Value Range Status  01/27/2013 32.7* 36.0 - 46.0 % Final    Discharge Physical Exam:   General: alert, cooperative and no distress Lochia: appropriate Uterine Fundus: firm Incision: NA DVT Evaluation: No evidence of DVT seen on physical exam. Reflexes normal, no clonus Abdomen is nontender 2+ edema on the right ankle  Intrapartum Procedures: spontaneous vaginal delivery Postpartum Procedures: IV magnesium therapy Complications-Operative and Postpartum: none  Discharge Diagnoses: Term Pregnancy-delivered, Preelampsia and Nuchal cord, shoulder dystocia  Discharge Information:  Activity:           pelvic rest Diet:                routine Medications: PNV, Ibuprofen, Iron, Percocet and HCTZ 25 mg daily Condition:      stable and improved Instructions:  Postpartum depression and care after vaginal delivery Discharge to: home  Follow-up Information   Follow up with [facility] In 1 week.   Contact information:   [address] [place] [address]        Janine Limbo 01/28/2013

## 2013-02-13 ENCOUNTER — Emergency Department (HOSPITAL_COMMUNITY)
Admission: EM | Admit: 2013-02-13 | Discharge: 2013-02-13 | Disposition: A | Discharge status: Home / Self Care | Payer: Medicaid Other | Attending: Emergency Medicine | Admitting: Emergency Medicine

## 2013-02-13 ENCOUNTER — Encounter (HOSPITAL_COMMUNITY): Payer: Self-pay | Admitting: Emergency Medicine

## 2013-02-13 DIAGNOSIS — K644 Residual hemorrhoidal skin tags: Secondary | ICD-10-CM | POA: Insufficient documentation

## 2013-02-13 DIAGNOSIS — Z8742 Personal history of other diseases of the female genital tract: Secondary | ICD-10-CM | POA: Insufficient documentation

## 2013-02-13 DIAGNOSIS — Z79899 Other long term (current) drug therapy: Secondary | ICD-10-CM | POA: Insufficient documentation

## 2013-02-13 DIAGNOSIS — K649 Unspecified hemorrhoids: Secondary | ICD-10-CM

## 2013-02-13 DIAGNOSIS — K6289 Other specified diseases of anus and rectum: Secondary | ICD-10-CM | POA: Insufficient documentation

## 2013-02-13 DIAGNOSIS — Z8619 Personal history of other infectious and parasitic diseases: Secondary | ICD-10-CM | POA: Insufficient documentation

## 2013-02-13 MED ORDER — HYDROCORTISONE ACE-PRAMOXINE 1-1 % RE FOAM: 1.0000 | Freq: Two times a day (BID) | RECTAL | Status: DC

## 2013-02-13 MED ORDER — DOCUSATE SODIUM 100 MG PO CAPS: 100.0000 mg | ORAL_CAPSULE | Freq: Two times a day (BID) | ORAL | Status: DC

## 2013-02-13 NOTE — ED Provider Notes (Signed)
History    CSN: [id number] Arrival date & time 02/13/13  1140 First MD Initiated Contact with Patient 02/13/13 1219      Chief Complaint  Patient presents with  . Hemorrhoids    HPI Patient presents to the emergency room with complaints of anal pain. Patient recently delivered a baby 2 weeks ago. She's noticed in the last few days that she's had pain when she has a bowel movement and has a small lump in her anal area that is uncomfortable and tender. Denies any rectal bleeding. She denies any abdominal pain. Pain in the anal area increases with bowel movements. Past Medical History  Diagnosis Date  . Yeast infection   . Bacterial vaginosis     History reviewed. No pertinent past surgical history.  Family History  Problem Relation Age of Onset  . Hypertension Father   . Hypertension Paternal Grandmother     History  Substance Use Topics  . Smoking status: Never Smoker   . Smokeless tobacco: Never Used  . Alcohol Use: No    OB History   Grav Para Term Preterm Abortions TAB SAB Ect Mult Living   3 2 2  1  1   2       Review of Systems  All other systems reviewed and are negative.    Allergies  Review of patient's allergies indicates no known allergies.  Home Medications   Current Outpatient Rx  Name  Route  Sig  Dispense  Refill  . acetaminophen (TYLENOL) 325 MG tablet   Oral   Take 650 mg by mouth daily as needed. For pain....headach         . ferrous sulfate (FERROUSUL) 325 (65 FE) MG tablet   Oral   Take 1 tablet (325 mg total) by mouth 2 (two) times daily with a meal.   100 tablet   1   . hydrochlorothiazide (HYDRODIURIL) 25 MG tablet   Oral   Take 1 tablet (25 mg total) by mouth daily.   30 tablet   1   . ibuprofen (ADVIL,MOTRIN) 800 MG tablet   Oral   Take 1 tablet (800 mg total) by mouth every 8 (eight) hours as needed for pain.   50 tablet   1   . docusate sodium (COLACE) 100 MG capsule   Oral   Take 1 capsule (100 mg total) by mouth  every 12 (twelve) hours.   60 capsule   0   . hydrocortisone-pramoxine (PROCTOFOAM HC) rectal foam   Rectal   Place 1 applicator rectally 2 (two) times daily.   10 g   0     BP 141/86  Pulse 82  Temp(Src) 98.8 F (37.1 C) (Oral)  Resp 18  SpO2 100%  Physical Exam  Nursing note and vitals reviewed. Constitutional: She appears well-developed and well-nourished. No distress.  HENT:  Head: Normocephalic and atraumatic.  Right Ear: External ear normal.  Left Ear: External ear normal.  Eyes: Conjunctivae are normal. Right eye exhibits no discharge. Left eye exhibits no discharge. No scleral icterus.  Neck: Neck supple. No tracheal deviation present.  Cardiovascular: Normal rate.   Pulmonary/Chest: Effort normal. No stridor. No respiratory distress.  Abdominal: Soft. She exhibits no distension. There is no tenderness. There is no rebound and no guarding.  Genitourinary: Rectal exam shows external hemorrhoid and tenderness. Rectal exam shows no fissure and anal tone normal.  Musculoskeletal: She exhibits no edema.  Neurological: She is alert. Cranial nerve deficit: no gross deficits.  Skin: Skin is warm and dry. No rash noted.  Psychiatric: She has a normal mood and affect.    ED Course  Procedures (including critical care time)  Labs Reviewed - No data to display No results found.   1. Hemorrhoids       MDM  Patient has a tender external hemorrhoid. There is no evidence to suggest thrombosis. Patient be treated with medications, Proctofoam and Colace. Recommended she followup with her gynecologist       Celene Kras, MD 02/13/13 1256

## 2013-02-13 NOTE — ED Notes (Addendum)
Patient presents with hemorrhoids and pain while having a BM.  Patient states she cannot have a BM- last BM was yesterday.  Patient has not tried any OTC meds.

## 2013-06-26 ENCOUNTER — Other Ambulatory Visit: Payer: Self-pay | Admitting: Infectious Diseases

## 2013-06-26 ENCOUNTER — Ambulatory Visit
Admission: RE | Admit: 2013-06-26 | Discharge: 2013-06-26 | Disposition: A | Discharge status: Home / Self Care | Payer: Medicaid Other | Source: Ambulatory Visit | Attending: Infectious Diseases | Admitting: Infectious Diseases

## 2013-06-26 DIAGNOSIS — R7611 Nonspecific reaction to tuberculin skin test without active tuberculosis: Secondary | ICD-10-CM

## 2013-09-14 ENCOUNTER — Other Ambulatory Visit: Payer: Self-pay

## 2014-04-26 DIAGNOSIS — Z331 Pregnant state, incidental: Secondary | ICD-10-CM

## 2014-08-23 IMAGING — US US OB COMP LESS 14 WK
1 series · 14 of 26 positions shown · non-contrast
Comparison: None.

CLINICAL DATA: Left lower quadrant pain

OBSTETRIC <14 WK ULTRASOUND
TECHNIQUE: Transabdominal ultrasound was performed for evaluation
of the gestation as well as the maternal uterus and adnexal
regions.

[Series 1: us ob comp less 14 wks · 14 of 26 slices shown]
[im 1/26]
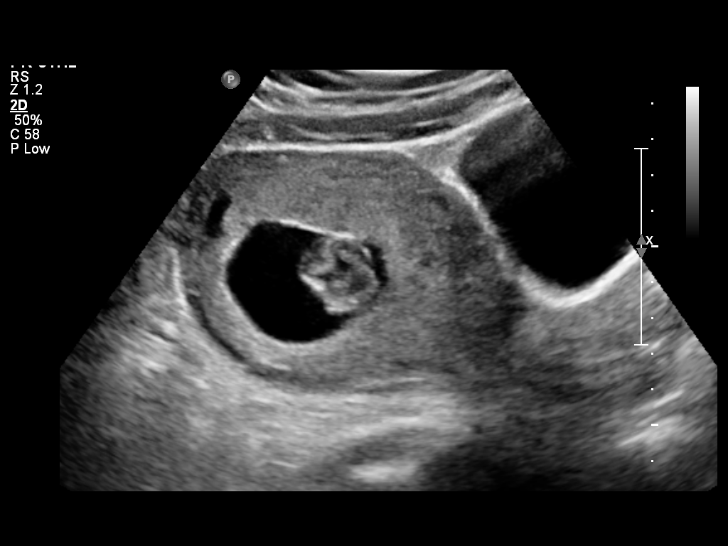
[im 3/26]
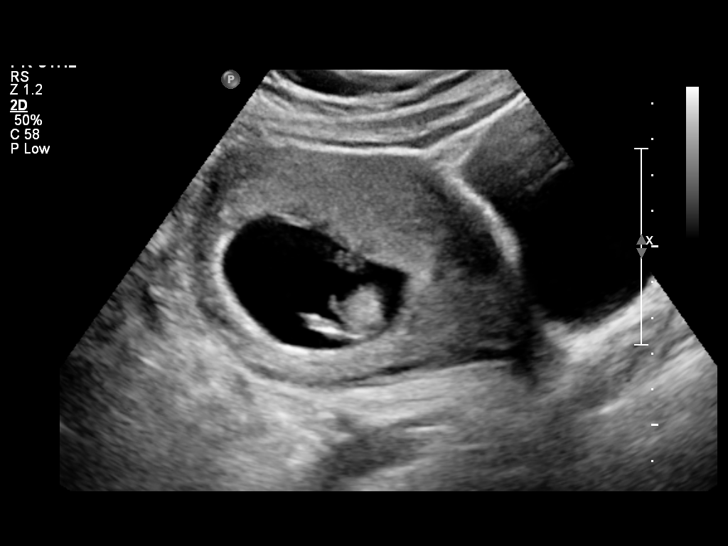
[im 5/26]
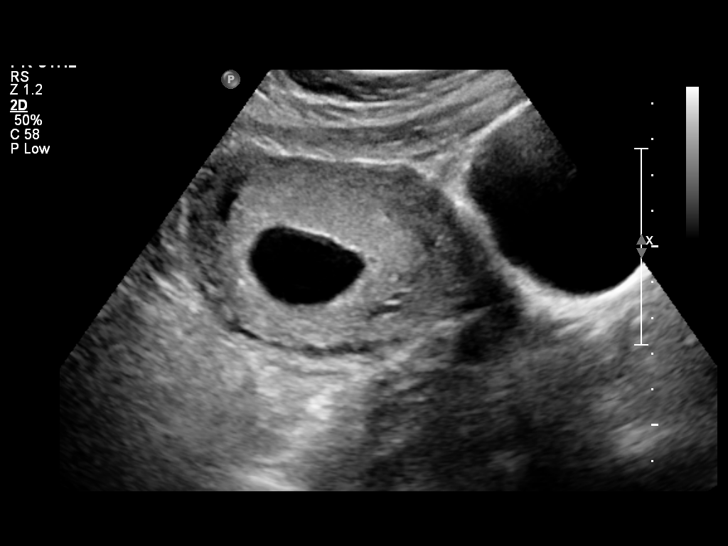
[im 7/26]
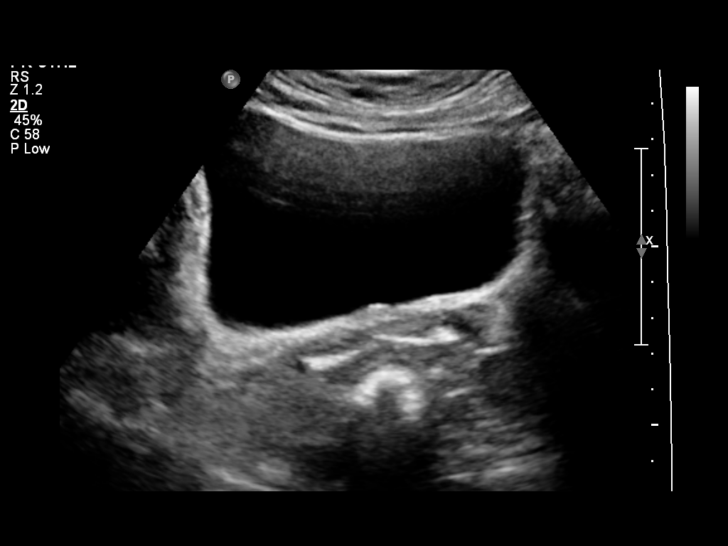
[im 9/26]
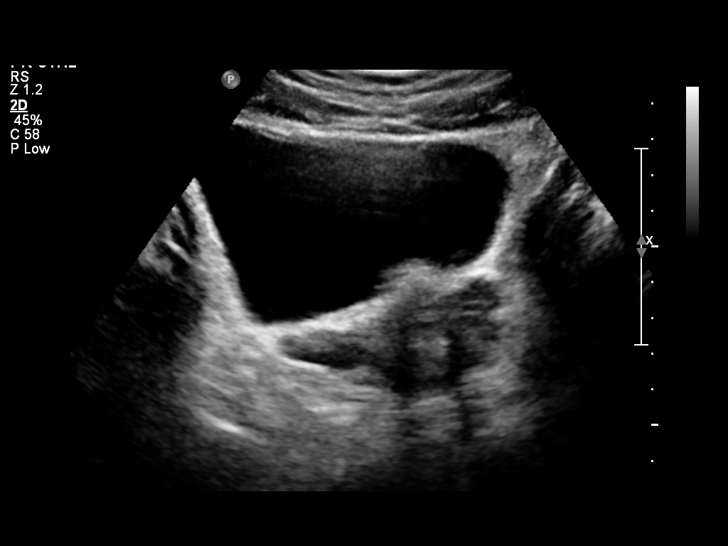
[im 11/26]
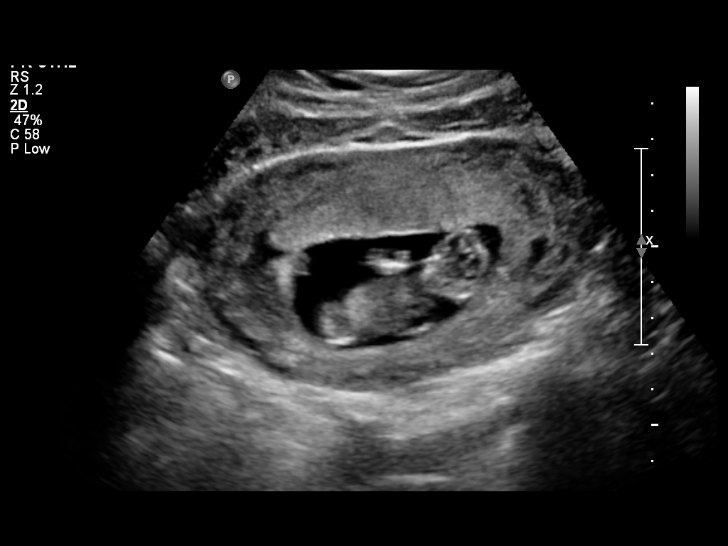
[im 13/26]
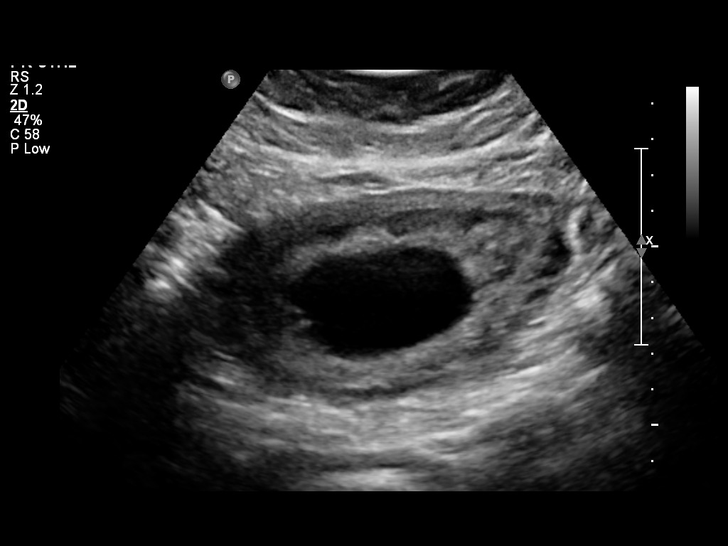
[im 14/26]
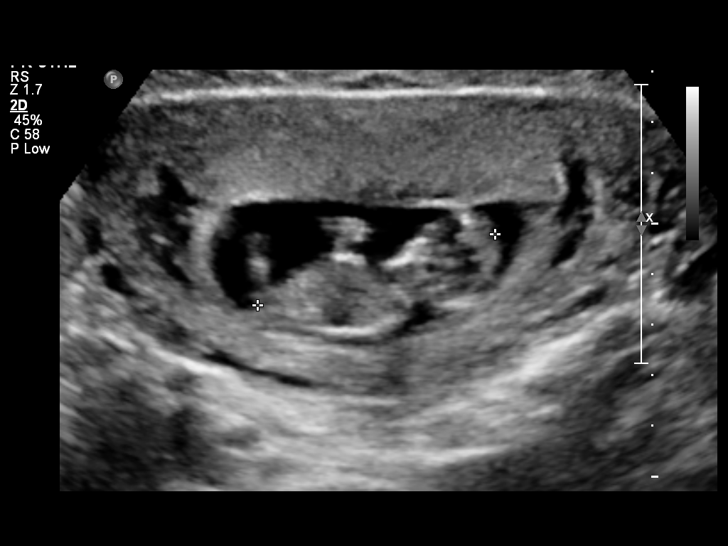
[im 16/26]
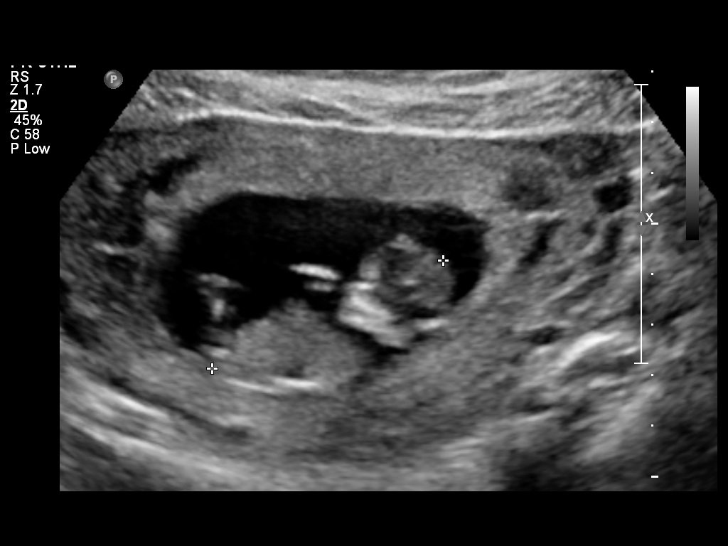
[im 18/26]
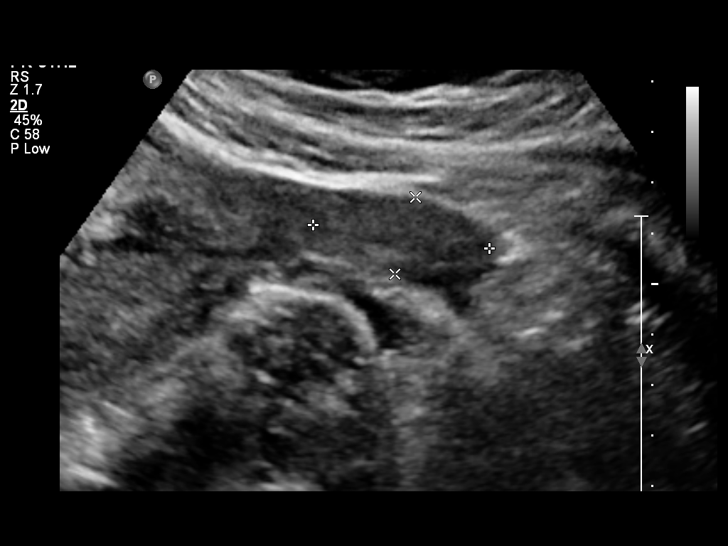
[im 20/26]
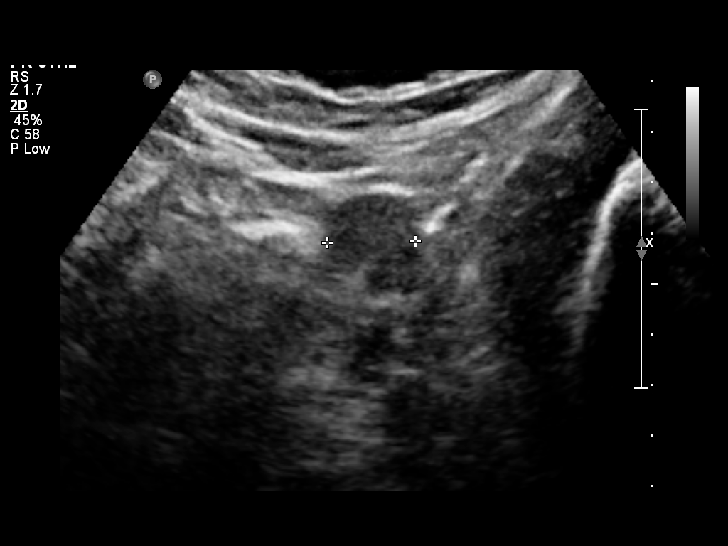
[im 22/26]
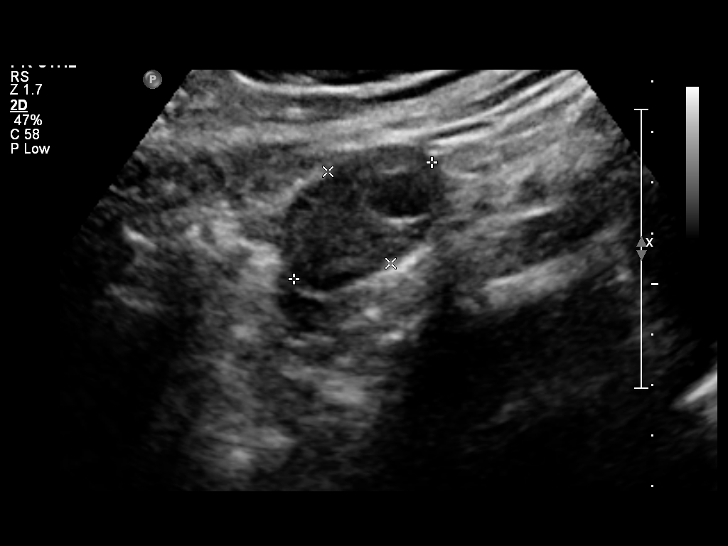
[im 24/26]
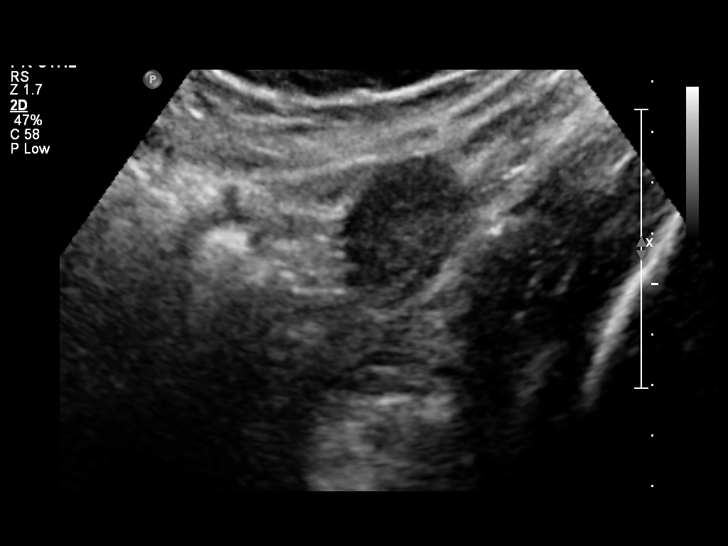
[im 26/26]
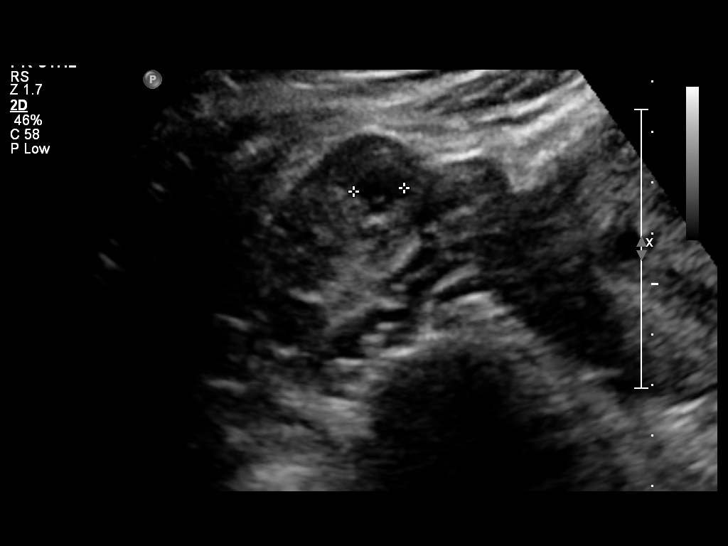

[14 of 26 positions shown; findings below may reference images not displayed]

Intrauterine gestational sac: Visualized/normal in shape.
Yolk sac: Identified
Embryo: Identified
Cardiac Activity: Identified
Heart Rate: 158 bpm

CRL:  5.01 mm  11 w  6 d            US EDC: 02/05/2013

Maternal uterus/Adnexae:
No subchorionic hemorrhage.  Normal sonographic appearance to the
ovaries.  Corpus luteal cyst on the right.  No free fluid.
IMPRESSION: Single intrauterine gestation with cardiac activity documented.
Estimated age of 11-week 6 days by crown-rump length.

## 2014-09-10 ENCOUNTER — Encounter (HOSPITAL_COMMUNITY): Payer: Self-pay | Admitting: Emergency Medicine

## 2015-04-10 ENCOUNTER — Emergency Department (HOSPITAL_COMMUNITY): Payer: Medicaid Other

## 2015-04-10 ENCOUNTER — Encounter (HOSPITAL_COMMUNITY): Payer: Self-pay | Admitting: *Deleted

## 2015-04-10 ENCOUNTER — Emergency Department (HOSPITAL_COMMUNITY)
Admission: EM | Admit: 2015-04-10 | Discharge: 2015-04-10 | Disposition: A | Discharge status: Home / Self Care | Payer: Medicaid Other | Attending: Emergency Medicine | Admitting: Emergency Medicine

## 2015-04-10 DIAGNOSIS — M549 Dorsalgia, unspecified: Secondary | ICD-10-CM | POA: Diagnosis not present

## 2015-04-10 DIAGNOSIS — R079 Chest pain, unspecified: Secondary | ICD-10-CM

## 2015-04-10 DIAGNOSIS — Z8742 Personal history of other diseases of the female genital tract: Secondary | ICD-10-CM | POA: Diagnosis not present

## 2015-04-10 DIAGNOSIS — Z8619 Personal history of other infectious and parasitic diseases: Secondary | ICD-10-CM | POA: Insufficient documentation

## 2015-04-10 LAB — BASIC METABOLIC PANEL
Anion gap: 10 (ref 5–15)
BUN: 9 mg/dL (ref 6–20)
CALCIUM: 9.4 mg/dL (ref 8.9–10.3)
CHLORIDE: 105 mmol/L (ref 101–111)
CO2: 23 mmol/L (ref 22–32)
CREATININE: 0.7 mg/dL (ref 0.44–1.00)
GFR calc Af Amer: >60 mL/min (ref >60)
Glucose, Bld: 96 mg/dL (ref 65–99)
POTASSIUM: 4.1 mmol/L (ref 3.5–5.1)
SODIUM: 138 mmol/L (ref 135–145)

## 2015-04-10 LAB — I-STAT TROPONIN, ED: TROPONIN I, POC: 0 ng/mL (ref 0.00–0.08)

## 2015-04-10 LAB — I-STAT BETA HCG BLOOD, ED (MC, WL, AP ONLY): I-stat hCG, quantitative: <5 m[IU]/mL (ref <5)

## 2015-04-10 LAB — CBC
HCT: 39.7 % (ref 36.0–46.0)
HEMOGLOBIN: 13.4 g/dL (ref 12.0–15.0)
MCH: 26.3 pg (ref 26.0–34.0)
MCHC: 33.8 g/dL (ref 30.0–36.0)
MCV: 78 fL (ref 78.0–100.0)
PLATELETS: 324 10*3/uL (ref 150–400)
RBC: 5.09 MIL/uL (ref 3.87–5.11)
RDW: 14.5 % (ref 11.5–15.5)
WBC: 9 10*3/uL (ref 4.0–10.5)

## 2015-04-10 MED ORDER — HYDROCODONE-ACETAMINOPHEN 5-325 MG PO TABS: 1.0000 | ORAL_TABLET | ORAL | Status: DC | PRN

## 2015-04-10 NOTE — ED Notes (Signed)
NAD at this time. Pt is stable and going home.  

## 2015-04-10 NOTE — ED Notes (Signed)
Pt states that she has had intermittent chest and back pain for several months. Pt states "it just is pain" when asked to describe it. Pt denies any associated symptoms.

## 2015-04-10 NOTE — ED Notes (Signed)
Pt back from X-Ray.  

## 2015-04-10 NOTE — ED Notes (Signed)
Pt placed in gown and in bed. Pt monitored by pulse ox, bp cuff, and 5-lead. 

## 2015-04-10 NOTE — Discharge Instructions (Signed)
Your work up today was normal. Take the prescribed medication as directed if needed for pain-- use caution, can make you drowsy and will be transferred in breast milk. Follow-up with cardiology group if desired. Return to the ED for new or worsening symptoms.

## 2015-04-10 NOTE — ED Provider Notes (Signed)
CSN: [id number]     Arrival date & time 04/10/15  [id number] History   First MD Initiated Contact with Patient 04/10/15 [phone]     Chief Complaint  Patient presents with  . Chest Pain     (Consider location/radiation/quality/duration/timing/severity/associated sxs/prior Treatment) The history is provided by the patient and medical records.    This is a 29 year old female with history of pre-eclampsia now 2 months post-partum, presenting to the ED for chest pain. Patient states she has had intermittent left upper chest pain with radiation to her back intermittently for the past 6 months. She states she experiences the pain with coughing or laughing. She has difficulty characterizing her pain states "it is just a pain". She denies any associated shortness of breath, diaphoresis, nausea, vomiting, dizziness, or weakness. Patient has no known cardiac history. She has no family cardiac history. She has never been a smoker. Patient denies any recent travel, prolonged immobilization, hx of DVT or PE.  She is not currently on exogenous estrogens.  She did have a baby in March 2016 but states she has been doing well since then, BP has stabilized and patient has come off her HCTZ.  Patient denies any current symptoms.  VSS.  Past Medical History  Diagnosis Date  . Yeast infection   . Bacterial vaginosis    History reviewed. No pertinent past surgical history. Family History  Problem Relation Age of Onset  . Hypertension Father   . Hypertension Paternal Grandmother    History  Substance Use Topics  . Smoking status: Never Smoker   . Smokeless tobacco: Never Used  . Alcohol Use: No   OB History    Gravida Para Term Preterm AB TAB SAB Ectopic Multiple Living   3 2 2  1  1   2      Review of Systems  Cardiovascular: Positive for chest pain.  All other systems reviewed and are negative.     Allergies  Review of patient's allergies indicates no known allergies.  Home Medications   Prior to  Admission medications   Medication Sig Start Date End Date Taking? Authorizing Provider  ferrous sulfate (FERROUSUL) 325 (65 FE) MG tablet Take 1 tablet (325 mg total) by mouth 2 (two) times daily with a meal. Patient not taking: Reported on 04/10/2015 01/28/13   Kirkland HunArthur Stringer, MD  hydrochlorothiazide (HYDRODIURIL) 25 MG tablet Take 1 tablet (25 mg total) by mouth daily. Patient not taking: Reported on 04/10/2015 01/28/13   Kirkland HunArthur Stringer, MD  ibuprofen (ADVIL,MOTRIN) 800 MG tablet Take 1 tablet (800 mg total) by mouth every 8 (eight) hours as needed for pain. Patient not taking: Reported on 04/10/2015 01/28/13   Kirkland HunArthur Stringer, MD   BP 117/72 mmHg  Pulse 67  Temp(Src) 98 F (36.7 C) (Oral)  Resp 16  SpO2 98%  LMP 02/08/2015   Physical Exam  Constitutional: She is oriented to person, place, and time. She appears well-developed and well-nourished. No distress.  HENT:  Head: Normocephalic and atraumatic.  Mouth/Throat: Oropharynx is clear and moist.  Eyes: Conjunctivae and EOM are normal. Pupils are equal, round, and reactive to light.  Neck: Normal range of motion. Neck supple.  Cardiovascular: Normal rate, regular rhythm and normal heart sounds.   Pulmonary/Chest: Effort normal and breath sounds normal. No respiratory distress. She has no wheezes.  Abdominal: Soft. Bowel sounds are normal. There is no tenderness. There is no guarding and no CVA tenderness.  Musculoskeletal: Normal range of motion. She exhibits no edema.  No  calf asymmetry, tenderness, or palpable cords; no overlying skin changes or warmth to touch  Neurological: She is alert and oriented to person, place, and time.  Skin: Skin is warm and dry. She is not diaphoretic.  Psychiatric: She has a normal mood and affect.  Nursing note and vitals reviewed.   ED Course  Procedures (including critical care time) Labs Review Labs Reviewed  CBC  BASIC METABOLIC PANEL  I-STAT TROPOININ, ED  I-STAT BETA HCG BLOOD, ED ([facility], [facility],  [facility] ONLY)    Imaging Review Dg Chest 2 View  04/10/2015   CLINICAL DATA:  Chest pain for several weeks  EXAM: CHEST  2 VIEW  COMPARISON:  June 26, 2013  FINDINGS: Lungs are clear. The heart size and pulmonary vascularity are normal. No adenopathy. No pneumothorax. No bone lesions. There is mild upper lumbar dextroscoliosis.  IMPRESSION: No edema or consolidation.   Electronically Signed   By: Bretta Bang III M.D.   On: 04/10/2015 09:21     EKG Interpretation   Date/Time:  Wednesday April 10 2015 08:35:40 EDT Ventricular Rate:  59 PR Interval:  138 QRS Duration: 76 QT Interval:  392 QTC Calculation: 388 R Axis:   73 Text Interpretation:  Sinus bradycardia Otherwise normal ECG No  significant change was found Confirmed by CAMPOS  MD, KEVIN ([id number]) on  04/10/2015 9:23:54 AM      MDM   Final diagnoses:  Chest pain, unspecified chest pain type   29 year old female with intermittent left sided chest and back pain for the past 6 months.  No associated symptoms. No personal or family cardiac history. EKG sinus bradycardia, no acute ischemic changes. Lab work reassuring, trop negative.  CXR is clear.  Pain is not clearly related to exertion and patient has essentially no RF with a heart score of 0.  PERC negative without clinical signs of DVT on exam.  Low suspicion for ACS, PE, dissection, or other acute cardiac event at this time. Patient was referred to cardiology at her request.  Discussed plan with patient, he/she acknowledged understanding and agreed with plan of care.  Return precautions given for new or worsening symptoms.  Garlon Hatchet, PA-C 04/10/15 1343  Azalia Bilis, MD 04/12/15 2350

## 2015-04-10 NOTE — ED Notes (Signed)
Pt left for XRay 

## 2016-04-17 ENCOUNTER — Inpatient Hospital Stay (HOSPITAL_COMMUNITY)
Admission: AD | Admit: 2016-04-17 | Discharge: 2016-04-17 | Disposition: A | Discharge status: Home / Self Care | Payer: Medicaid Other | Source: Ambulatory Visit | Attending: Obstetrics & Gynecology | Admitting: Obstetrics & Gynecology

## 2016-04-17 DIAGNOSIS — N912 Amenorrhea, unspecified: Secondary | ICD-10-CM | POA: Insufficient documentation

## 2016-04-17 NOTE — MAU Note (Signed)
Pt missed her period in May, had pos HPT, wants to confirm pregnancy.  Denies pain or bleeding.

## 2016-04-17 NOTE — MAU Provider Note (Signed)
Ms.Deklyn Terri PiedraOseiDanquah is a 30 y.o. [id number] at Unknown who presents to MAU today for pregnancy verification. The patient denies abdominal pain or vaginal bleeding today.   BP 120/68 mmHg  Pulse 81  Temp(Src) 98.4 F (36.9 C) (Oral)  Resp 18  LMP 02/29/2016  CONSTITUTIONAL: Well-developed, well-nourished female in no acute distress.  CARDIOVASCULAR: Regular heart rate RESPIRATORY: Normal effort NEUROLOGICAL: Alert and oriented to person, place, and time.  SKIN: Skin is warm and dry. No rash noted. Not diaphoretic. No erythema. No pallor. PSYCH: Normal mood and affect. Normal behavior. Normal judgment and thought content.  MDM Medical Screen Exam Complete  A: Amenorrhea   P: Discharge from MAU Patient advised to follow-up with [facility] for pregnancy confirmation  Monday-Thursday 8am-4pm or Friday 8am-11am Patient may return to MAU as needed or if her condition were to change or worsen   Hurshel PartyLisa A Leftwich-Kirby, CNM  04/17/2016 5:04 PM

## 2016-04-20 ENCOUNTER — Ambulatory Visit (INDEPENDENT_AMBULATORY_CARE_PROVIDER_SITE_OTHER): Payer: Medicaid Other

## 2016-04-20 ENCOUNTER — Encounter: Payer: Self-pay | Admitting: Obstetrics & Gynecology

## 2016-04-20 DIAGNOSIS — Z3201 Encounter for pregnancy test, result positive: Secondary | ICD-10-CM | POA: Diagnosis present

## 2016-04-20 DIAGNOSIS — Z3200 Encounter for pregnancy test, result unknown: Secondary | ICD-10-CM

## 2016-04-20 LAB — POCT PREGNANCY, URINE: Preg Test, Ur: POSITIVE — AB

## 2016-04-20 NOTE — Progress Notes (Signed)
Pt presented today for pregnancy test. Test confirms she is currently pregnant around six weeks. Pt EDD is 12/10/2016. Pt has been given a letter of verification. She will follow up in a couple of weeks.

## 2016-04-27 ENCOUNTER — Inpatient Hospital Stay (HOSPITAL_COMMUNITY)
Admission: AD | Admit: 2016-04-27 | Discharge: 2016-04-27 | Disposition: A | Discharge status: Home / Self Care | Payer: Medicaid Other | Source: Ambulatory Visit | Attending: Obstetrics & Gynecology | Admitting: Obstetrics & Gynecology

## 2016-04-27 ENCOUNTER — Encounter (HOSPITAL_COMMUNITY): Payer: Self-pay | Admitting: *Deleted

## 2016-04-27 DIAGNOSIS — Z8249 Family history of ischemic heart disease and other diseases of the circulatory system: Secondary | ICD-10-CM | POA: Insufficient documentation

## 2016-04-27 DIAGNOSIS — R110 Nausea: Secondary | ICD-10-CM | POA: Diagnosis present

## 2016-04-27 DIAGNOSIS — Z3A01 Less than 8 weeks gestation of pregnancy: Secondary | ICD-10-CM | POA: Diagnosis not present

## 2016-04-27 DIAGNOSIS — O219 Vomiting of pregnancy, unspecified: Secondary | ICD-10-CM | POA: Diagnosis not present

## 2016-04-27 LAB — URINALYSIS, ROUTINE W REFLEX MICROSCOPIC
Bilirubin Urine: NEGATIVE
Glucose, UA: NEGATIVE mg/dL
Hgb urine dipstick: NEGATIVE
Ketones, ur: NEGATIVE mg/dL
NITRITE: NEGATIVE
PH: 6 (ref 5.0–8.0)
Protein, ur: NEGATIVE mg/dL
SPECIFIC GRAVITY, URINE: 1.015 (ref 1.005–1.030)

## 2016-04-27 LAB — URINE MICROSCOPIC-ADD ON
BACTERIA UA: NONE SEEN
RBC / HPF: NONE SEEN RBC/hpf (ref 0–5)

## 2016-04-27 MED ORDER — METOCLOPRAMIDE HCL 10 MG PO TABS: 10.0000 mg | ORAL_TABLET | Freq: Three times a day (TID) | ORAL | Status: DC

## 2016-04-27 NOTE — MAU Provider Note (Signed)
History     CSN: [id number]  Arrival date and time: 04/27/16 1607   First Provider Initiated Contact with Patient 04/27/16 1655      Chief Complaint  Patient presents with  . Morning Sickness   HPI   [name] is a 30 y.o. female [phone] @ [redacted]w[redacted]d presenting with a 3 days history of Nausea; she has not had any vomiting in the last 24 hours. She has not tried any medication for the nausea. The nausea lasts all day long.   She is able to keep down some fluids and some small meals.   OB History    Gravida Para Term Preterm AB TAB SAB Ectopic Multiple Living   Past Medical History  Diagnosis Date  . Yeast infection   . Bacterial vaginosis     History reviewed. No pertinent past surgical history.  Family History  Problem Relation Age of Onset  . Hypertension Father   . Hypertension Paternal Grandmother     Social History  Substance Use Topics  . Smoking status: Never Smoker   . Smokeless tobacco: Never Used  . Alcohol Use: No    Allergies: No Known Allergies  Prescriptions prior to admission  Medication Sig Dispense Refill Last Dose  . Prenatal Vit-Fe Fumarate-FA (PRENATAL MULTIVITAMIN) TABS tablet Take 1 tablet by mouth daily at 12 noon.   04/27/2016 at Unknown time   Results for orders placed or performed during the hospital encounter of 04/27/16 (from the past 48 hour(s))  Urinalysis, Routine w reflex microscopic (not at [facility])     Status: Abnormal   Collection Time: 04/27/16  4:29 PM  Result Value Ref Range   Color, Urine YELLOW YELLOW   APPearance CLEAR CLEAR   Specific Gravity, Urine 1.015 1.005 - 1.030   pH 6.0 5.0 - 8.0   Glucose, UA NEGATIVE NEGATIVE mg/dL   Hgb urine dipstick NEGATIVE NEGATIVE   Bilirubin Urine NEGATIVE NEGATIVE   Ketones, ur NEGATIVE NEGATIVE mg/dL   Protein, ur NEGATIVE NEGATIVE mg/dL   Nitrite NEGATIVE NEGATIVE   Leukocytes, UA SMALL (A) NEGATIVE  Urine microscopic-add on     Status: Abnormal   Collection Time: 04/27/16  4:29 PM  Result Value Ref Range   Squamous Epithelial / LPF 0-5 (A) NONE SEEN   WBC, UA 0-5 0 - 5 WBC/hpf   RBC / HPF NONE SEEN 0 - 5 RBC/hpf   Bacteria, UA NONE SEEN NONE SEEN    Review of Systems  Constitutional: Negative for fever.  Gastrointestinal: Positive for nausea. Negative for vomiting, abdominal pain, diarrhea and constipation.  Genitourinary: Negative for dysuria.   Physical Exam   Blood pressure 118/63, pulse 69, temperature 98.2 F (36.8 C), temperature source Oral, resp. rate 18, weight 149 lb 0.6 oz (67.604 kg), last menstrual period 03/30/2016, SpO2 100 %.  Physical Exam  Constitutional: She is oriented to person, place, and time. She appears well-developed and well-nourished. No distress.  HENT:  Head: Normocephalic.  Eyes: Pupils are equal, round, and reactive to light.  Respiratory: Effort normal. No respiratory distress. She has no wheezes. She has no rales.  GI: Soft. She exhibits no distension. There is no tenderness. There is no rebound and no guarding.  Musculoskeletal: Normal range of motion.  Neurological: She is alert and oriented to person, place, and time.  Skin: Skin is warm. She is not diaphoretic.  Psychiatric: Her behavior is normal.  MAU Course  Procedures  None  MDM   Assessment and Plan    A:  1. Nausea/vomiting in pregnancy     P:  Discharge home in stable condition Rx: Reglan  Start prenatal care; list of local OBGYN's given Prenatal vitamins daily First trimester warning signs discussed Return to MAU if symptoms worsen.    Duane LopeJennifer I Devin Ganaway, NP 04/27/2016 5:19 PM

## 2016-04-27 NOTE — MAU Note (Signed)
Has been too sick.  Feeling "heaviness" in belly. (denies pain). Feels cold, chills at times.

## 2016-04-27 NOTE — Discharge Instructions (Signed)

## 2016-04-27 NOTE — MAU Note (Addendum)
C/o nausea for past 3 days; c/o having "heaviness in her belly" for past 3 weeks; denies pain

## 2016-05-20 ENCOUNTER — Encounter: Payer: Self-pay | Admitting: Obstetrics and Gynecology

## 2016-05-20 ENCOUNTER — Ambulatory Visit (INDEPENDENT_AMBULATORY_CARE_PROVIDER_SITE_OTHER): Payer: Medicaid Other | Admitting: Obstetrics and Gynecology

## 2016-05-20 VITALS — BP 114/72 | HR 79 | Temp 98.1°F | Wt 193.3 lb

## 2016-05-20 DIAGNOSIS — O99210 Obesity complicating pregnancy, unspecified trimester: Secondary | ICD-10-CM | POA: Diagnosis not present

## 2016-05-20 DIAGNOSIS — Z8759 Personal history of other complications of pregnancy, childbirth and the puerperium: Secondary | ICD-10-CM | POA: Insufficient documentation

## 2016-05-20 DIAGNOSIS — K429 Umbilical hernia without obstruction or gangrene: Secondary | ICD-10-CM

## 2016-05-20 DIAGNOSIS — Z1389 Encounter for screening for other disorder: Secondary | ICD-10-CM | POA: Diagnosis not present

## 2016-05-20 DIAGNOSIS — O0990 Supervision of high risk pregnancy, unspecified, unspecified trimester: Secondary | ICD-10-CM | POA: Insufficient documentation

## 2016-05-20 DIAGNOSIS — Z3491 Encounter for supervision of normal pregnancy, unspecified, first trimester: Secondary | ICD-10-CM | POA: Diagnosis not present

## 2016-05-20 DIAGNOSIS — Z6833 Body mass index (BMI) 33.0-33.9, adult: Secondary | ICD-10-CM | POA: Insufficient documentation

## 2016-05-20 DIAGNOSIS — Z331 Pregnant state, incidental: Secondary | ICD-10-CM

## 2016-05-20 HISTORY — DX: Umbilical hernia without obstruction or gangrene: K42.9

## 2016-05-20 LAB — POCT URINALYSIS DIPSTICK
Bilirubin, UA: NEGATIVE
Blood, UA: NEGATIVE
Glucose, UA: NEGATIVE
KETONES UA: NEGATIVE
NITRITE UA: NEGATIVE
PROTEIN UA: NEGATIVE
Spec Grav, UA: 1.025
Urobilinogen, UA: 0.2
pH, UA: 5

## 2016-05-20 NOTE — Progress Notes (Signed)
New OB Note  05/20/2016   Clinic: [facility]  Chief Complaint: New OB visit  Transfer of Care Patient: no  History of Present Illness: [name] is a 30 y.o. [id number] @ 7/2 weeks (Patient's last menstrual period was 03/30/2016.), with the above CC. Preg complicated by has Supervision of normal pregnancy; BMI 33.0-33.9,adult; Obesity in pregnancy; History of pre-eclampsia; History of shoulder dystocia in prior pregnancy; and Umbilical hernia on her problem list. Morning sickness s/s are improving. Has not had an u/s yet this pregnancy.   Her periods were: irregular She was using no method when she conceived.  She has Positive signs or symptoms of nausea/vomiting of pregnancy. She has Negative signs or symptoms of miscarriage or preterm labor She identifies Negative Zika risk factors for her and her partner  ROS: A 12-point review of systems was performed and negative, except as stated in the above HPI.  OBGYN History: As per HPI. OB History  Gravida Para Term Preterm AB SAB TAB Ectopic Multiple Living  '4 2 2  1 1    2    '$ # Outcome Date GA Lbr Len/2nd Weight Sex Delivery Anes PTL Lv  4 Current           3 Term 01/26/13 68w4d10:04 / 00:34 8 lb 9.8 oz (3.907 kg) M Vag-Spont EPI  Y  2 SAB 01/2012 [redacted]w[redacted]d          Comments: Passed naturally;no complications  1 Term 0503/55/97045w0d lb (2.722 kg) M Vag-Spont EPI  Y     Comments: No complications      Any prior children are healthy, doing well, without any problems or issues: yes. Infant with SD is doing well and no sequelae of the event  History of abnormal pap smears: No. Last pap smear ?with her last pregnancy History of STIs: No   Past Medical History: Past Medical History  Diagnosis Date  . Yeast infection   . Bacterial vaginosis   . Preeclampsia 01/25/2013  . Umbilical hernia 05/23/15/3845 Past Surgical History: History reviewed. No pertinent past surgical history.  Family History:  Family History  Problem Relation  Age of Onset  . Hypertension Father   . Hypertension Paternal Grandmother    She denies any female cancers, bleeding or blood clotting disorders.  She denies any history of mental retardation, birth defects or genetic disorders in her or the FOB's history  Social History:  Social History   Social History  . Marital Status: Married    Spouse Name: FelKarma Lew Number of Children: 1  . Years of Education: 12   Occupational History  . [employer]     [employer]   Social History Main Topics  . Smoking status: Never Smoker   . Smokeless tobacco: Never Used  . Alcohol Use: No  . Drug Use: No  . Sexual Activity:    Partners: Male     Comment: pregnant   Other Topics Concern  . Not on file   Social History Narrative  works in dietary at [employer]: No Known Allergies  Health Maintenance:  Mammogram Up to Date: not applicable  Current Outpatient Medications: PNV  Physical Exam:   BP 114/72 mmHg  Pulse 79  Temp(Src) 98.1 F (36.7 C)  Wt 193 lb 4.8 oz (87.68 kg)  LMP 03/30/2016 Body mass index is 32.17 kg/(m^2). Fundal height: not applicable  General appearance: Well nourished, well developed female in no acute distress.  Neck:  Supple, normal appearance, and no thyromegaly  Cardiovascular: S1, S2 normal, no murmur, rub or gallop, regular rate and rhythm Respiratory:  Clear to auscultation bilateral. Normal respiratory effort Abdomen: positive bowel sounds and no masses, hernias; diffusely non tender to palpation, non distended. 2cm umbilical hernia, nttp, easily reducible Breasts: breasts appear normal, no suspicious masses, no skin or nipple changes or axillary nodes, and normal palpation. Neuro/Psych:  Normal mood and affect.  Skin:  Warm and dry.  Lymphatic:  No inguinal lymphadenopathy.   Pelvic exam: is not limited by body habitus EGBUS: within normal limits, Vagina: within normal limits and with no blood in the vault, Cervix: normal appearing  cervix without discharge or lesions, closed/long/high, Uterus:  Small, and Adnexa:  normal adnexa and no mass, fullness, tenderness  Laboratory: none  Imaging:  Bedside u/s: SLIUP, subjectively normal fluid, good FM, normal FHR 150s, CRL c/w 9wks 5days  Assessment: pt doing well  Plan: 1. Prenatal care, first trimester Dating changed. Pt amenable to genetic screening with NT scan. Belly binder advised. Hernia ER precautions given - POCT Urinalysis Dipstick - Prenatal Profile I - Urine culture - Pap IG, CT/NG w/ reflex HPV when ASC-U - Cystic fibrosis gene test  2. Supervision of normal pregnancy, first trimester As above  3. History of SD Delivery note reviewed. Large infant compared to first one. Counsel patient in early 66rd trimester and would recommend 34-36wk growth u/s and d/w pt re: r/b/a to try for another VD.   4. BMI 33.0-33.9,adult Early 1hr today  5. Obesity in pregnancy As above - Glucose Tolerance, 1 Hour  6. History of pre-eclampsia Start Baby ASA 13-14wks. Baseline labs today - Protein / Creatinine Ratio, Urine - Comp Met (CMET)  Problem list reviewed and updated.  Follow up in 3-4 weeks.  >50% of 25 min visit spent on counseling and coordination of care.     Durene Romans MD Attending Center for [employer] [facility])

## 2016-05-21 LAB — URINE CULTURE

## 2016-05-25 ENCOUNTER — Encounter: Payer: Self-pay | Admitting: Obstetrics and Gynecology

## 2016-05-25 DIAGNOSIS — O99810 Abnormal glucose complicating pregnancy: Secondary | ICD-10-CM | POA: Insufficient documentation

## 2016-05-25 LAB — PAP IG, CT-NG, RFX HPV ASCU
Chlamydia, Nuc. Acid Amp: NEGATIVE
Gonococcus by Nucleic Acid Amp: NEGATIVE
PAP Smear Comment: 0

## 2016-05-26 LAB — PRENATAL PROFILE I(LABCORP)
ANTIBODY SCREEN: NEGATIVE
Basophils Absolute: 0 10*3/uL (ref 0.0–0.2)
Basos: 0 %
EOS (ABSOLUTE): 0.1 10*3/uL (ref 0.0–0.4)
EOS: 1 %
HEP B S AG: NEGATIVE
Hematocrit: 40.9 % (ref 34.0–46.6)
Hemoglobin: 13.4 g/dL (ref 11.1–15.9)
IMMATURE GRANULOCYTES: 0 %
Immature Grans (Abs): 0 10*3/uL (ref 0.0–0.1)
LYMPHS ABS: 2.8 10*3/uL (ref 0.7–3.1)
Lymphs: 22 %
MCH: 27.1 pg (ref 26.6–33.0)
MCHC: 32.8 g/dL (ref 31.5–35.7)
MCV: 83 fL (ref 79–97)
MONOCYTES: 3 %
Monocytes Absolute: 0.4 10*3/uL (ref 0.1–0.9)
NEUTROS ABS: 9.5 10*3/uL — AB (ref 1.4–7.0)
Neutrophils: 74 %
Platelets: 274 10*3/uL (ref 150–379)
RBC: 4.94 x10E6/uL (ref 3.77–5.28)
RDW: 16 % — ABNORMAL HIGH (ref 12.3–15.4)
RH TYPE: POSITIVE
RPR Ser Ql: NONREACTIVE
RUBELLA: 5.21 {index} (ref >0.99)
WBC: 13 10*3/uL — AB (ref 3.4–10.8)

## 2016-05-26 LAB — COMPREHENSIVE METABOLIC PANEL
ALBUMIN: 4 g/dL (ref 3.5–5.5)
ALK PHOS: 58 IU/L (ref 39–117)
ALT: 13 IU/L (ref 0–32)
AST: 13 IU/L (ref 0–40)
Albumin/Globulin Ratio: 1.4 (ref 1.2–2.2)
BILIRUBIN TOTAL: 0.6 mg/dL (ref 0.0–1.2)
BUN / CREAT RATIO: 15 (ref 9–23)
BUN: 9 mg/dL (ref 6–20)
CHLORIDE: 98 mmol/L (ref 96–106)
CO2: 20 mmol/L (ref 18–29)
CREATININE: 0.62 mg/dL (ref 0.57–1.00)
Calcium: 9 mg/dL (ref 8.7–10.2)
GFR calc Af Amer: 142 mL/min/{1.73_m2} (ref >59)
GFR calc non Af Amer: 123 mL/min/{1.73_m2} (ref >59)
GLUCOSE: 161 mg/dL — AB (ref 65–99)
Globulin, Total: 2.9 g/dL (ref 1.5–4.5)
Potassium: 4.1 mmol/L (ref 3.5–5.2)
Sodium: 137 mmol/L (ref 134–144)
Total Protein: 6.9 g/dL (ref 6.0–8.5)

## 2016-05-26 LAB — PROTEIN / CREATININE RATIO, URINE
Creatinine, Urine: 217.2 mg/dL
PROTEIN/CREAT RATIO: 56 mg/g{creat} (ref 0–200)
Protein, Ur: 12.1 mg/dL

## 2016-05-26 LAB — GLUCOSE TOLERANCE, 1 HOUR: Glucose, 1Hr PP: 154 mg/dL (ref 65–199)

## 2016-05-26 LAB — CYSTIC FIBROSIS GENE TEST

## 2016-05-26 LAB — HIV ANTIBODY (ROUTINE TESTING W REFLEX): HIV SCREEN 4TH GENERATION: NONREACTIVE

## 2016-06-01 ENCOUNTER — Telehealth: Payer: Self-pay | Admitting: *Deleted

## 2016-06-01 NOTE — Telephone Encounter (Signed)
Patient is calling to see if her FMLA paperwork is done

## 2016-06-03 NOTE — Telephone Encounter (Signed)
No FMLA paperwork on patient in office. Armandina Stammer RN BSN

## 2016-06-04 ENCOUNTER — Telehealth: Payer: Self-pay | Admitting: *Deleted

## 2016-06-11 ENCOUNTER — Other Ambulatory Visit: Payer: Medicaid Other

## 2016-06-11 ENCOUNTER — Ambulatory Visit (INDEPENDENT_AMBULATORY_CARE_PROVIDER_SITE_OTHER): Payer: Medicaid Other | Admitting: Obstetrics and Gynecology

## 2016-06-11 VITALS — BP 113/73 | HR 81 | Temp 98.2°F | Wt 194.4 lb

## 2016-06-11 DIAGNOSIS — Z1389 Encounter for screening for other disorder: Secondary | ICD-10-CM | POA: Diagnosis not present

## 2016-06-11 DIAGNOSIS — Z3491 Encounter for supervision of normal pregnancy, unspecified, first trimester: Secondary | ICD-10-CM

## 2016-06-11 DIAGNOSIS — O99210 Obesity complicating pregnancy, unspecified trimester: Secondary | ICD-10-CM | POA: Diagnosis not present

## 2016-06-11 DIAGNOSIS — Z3492 Encounter for supervision of normal pregnancy, unspecified, second trimester: Secondary | ICD-10-CM

## 2016-06-11 DIAGNOSIS — Z8759 Personal history of other complications of pregnancy, childbirth and the puerperium: Secondary | ICD-10-CM

## 2016-06-11 DIAGNOSIS — Z331 Pregnant state, incidental: Secondary | ICD-10-CM | POA: Diagnosis not present

## 2016-06-11 DIAGNOSIS — Z6833 Body mass index (BMI) 33.0-33.9, adult: Secondary | ICD-10-CM

## 2016-06-11 DIAGNOSIS — K429 Umbilical hernia without obstruction or gangrene: Secondary | ICD-10-CM

## 2016-06-11 LAB — POCT URINALYSIS DIPSTICK
Bilirubin, UA: NEGATIVE
Blood, UA: NEGATIVE
Glucose, UA: NEGATIVE
Ketones, UA: NEGATIVE
Nitrite, UA: NEGATIVE
Protein, UA: NEGATIVE
Spec Grav, UA: 1.02
Urobilinogen, UA: 0.2
pH, UA: 5

## 2016-06-11 MED ORDER — ASPIRIN EC 81 MG PO TBEC: 81.0000 mg | DELAYED_RELEASE_TABLET | Freq: Every day | ORAL | 6 refills | Status: DC

## 2016-06-11 NOTE — Progress Notes (Signed)
Subjective:  Marcia Bryant is a 30 y.o. [phone] at [redacted]w[redacted]d being seen today for ongoing prenatal care.  She is currently monitored for the following issues for this high-risk pregnancy and has Supervision of normal pregnancy; BMI 33.0-33.9,adult; Obesity in pregnancy; History of pre-eclampsia; History of shoulder dystocia in prior pregnancy; Umbilical hernia; and failed early 1h (154) on her problem list.  Patient reports no complaints.   . Vag. Bleeding: None.   . Denies leaking of fluid.   The following portions of the patient's history were reviewed and updated as appropriate: allergies, current medications, past family history, past medical history, past social history, past surgical history and problem list. Problem list updated.  Objective:   Vitals:   06/11/16 0906  BP: 113/73  Pulse: 81  Temp: 98.2 F (36.8 C)  Weight: 194 lb 6.4 oz (88.2 kg)    Fetal Status: Fetal Heart Rate (bpm): 160         General:  Alert, oriented and cooperative. Patient is in no acute distress.  Skin: Skin is warm and dry. No rash noted.   Cardiovascular: Normal heart rate noted  Respiratory: Normal respiratory effort, no problems with respiration noted  Abdomen: Soft, gravid, appropriate for gestational age. Pain/Pressure: Absent     Pelvic:  Cervical exam deferred        Extremities: Normal range of motion.  Edema: None  Mental Status: Normal mood and affect. Normal behavior. Normal judgment and thought content.   Urinalysis: Urine Protein: Negative Urine Glucose: Negative  Assessment and Plan:  Pregnancy: [id number] at [redacted]w[redacted]d  1. Prenatal care, first trimester  - POCT Urinalysis Dipstick  2. Umbilical hernia without obstruction and without gangrene No complaints  3. Supervision of normal pregnancy, second trimester Patient doing well 3hr glucola today Follow up first screen on 8/16  4. Obesity in pregnancy  - Gestational Glucose Tolerance  5. History of shoulder dystocia in prior  pregnancy   6. History of pre-eclampsia Rx ASA 81 mg daily provided  7. BMI 33.0-33.9,adult   General obstetric precautions including but not limited to vaginal bleeding, contractions, leaking of fluid and fetal movement were reviewed in detail with the patient. Please refer to After Visit Summary for other counseling recommendations.  Return in about 4 weeks (around 07/09/2016).   Catalina Antigua, MD

## 2016-06-12 LAB — GESTATIONAL GLUCOSE TOLERANCE
GLUCOSE 1 HOUR GTT: 162 mg/dL (ref 65–179)
GLUCOSE 2 HOUR GTT: 157 mg/dL — AB (ref 65–154)
GLUCOSE 3 HOUR GTT: 126 mg/dL (ref 65–139)
GLUCOSE FASTING: 103 mg/dL — AB (ref 65–94)

## 2016-06-15 ENCOUNTER — Other Ambulatory Visit: Payer: Self-pay | Admitting: Obstetrics and Gynecology

## 2016-06-15 DIAGNOSIS — O24419 Gestational diabetes mellitus in pregnancy, unspecified control: Secondary | ICD-10-CM | POA: Insufficient documentation

## 2016-06-15 DIAGNOSIS — O24410 Gestational diabetes mellitus in pregnancy, diet controlled: Secondary | ICD-10-CM

## 2016-06-15 MED ORDER — GLUCOSE BLOOD VI STRP: ORAL_STRIP | 12 refills | Status: DC

## 2016-06-15 MED ORDER — ACCU-CHEK NANO SMARTVIEW W/DEVICE KIT: 1.0000 | PACK | Freq: Four times a day (QID) | 0 refills | Status: DC

## 2016-06-15 MED ORDER — ACCU-CHEK FASTCLIX LANCETS MISC: 1.0000 [IU] | Freq: Four times a day (QID) | 3 refills | Status: DC

## 2016-06-16 ENCOUNTER — Telehealth: Payer: Self-pay

## 2016-06-16 NOTE — Telephone Encounter (Signed)
Left message for patient to return call to office.  Jennifer Howard RNBSN 

## 2016-06-16 NOTE — Telephone Encounter (Signed)
Call to patient regarding her FMLA paperwork she left at the office for her leave- while I was speaking to her I also told her about her lab results, her referral and advised her to pick up her meter and take it with her to her appointment. Discussed the importance of the appointment and what would be covered.

## 2016-06-16 NOTE — Telephone Encounter (Signed)
-----   Message from Arne ClevelandMandy J Hutchinson, [place] sent at 06/16/2016 11:49 AM EDT ----- I will send the form over to [facility] for them to call her to make an appointment. If you would call her to let her know to be expecting a call from them that would be great.   Thank you MH ----- Message ----- From: Catalina AntiguaPeggy Constant, MD Sent: 06/15/2016   2:28 PM To: Arne ClevelandMandy J Hutchinson, CMA  Please inform patient of GDM diagnosis in early pregnancy. Please schedule appointment for diabetic educator (order placed in Epic). Patient should also bring her meter (e-prescribed) at her appointment  Thanks  Peggy

## 2016-06-17 ENCOUNTER — Telehealth: Payer: Self-pay | Admitting: *Deleted

## 2016-06-17 NOTE — Telephone Encounter (Signed)
Need to f/u to see if Diabetes management was started.Message left to call office.

## 2016-06-17 NOTE — Telephone Encounter (Signed)
Left message to call office for lab results.

## 2016-06-19 ENCOUNTER — Encounter (HOSPITAL_COMMUNITY): Payer: Self-pay | Admitting: Obstetrics and Gynecology

## 2016-06-23 ENCOUNTER — Encounter (HOSPITAL_COMMUNITY): Payer: Self-pay

## 2016-06-24 ENCOUNTER — Ambulatory Visit (HOSPITAL_COMMUNITY): Admission: RE | Admit: 2016-06-24 | Payer: Medicaid Other | Source: Ambulatory Visit

## 2016-06-24 ENCOUNTER — Ambulatory Visit (HOSPITAL_COMMUNITY)
Admission: RE | Admit: 2016-06-24 | Discharge: 2016-06-24 | Disposition: A | Discharge status: Home / Self Care | Payer: Medicaid Other | Source: Ambulatory Visit | Attending: Obstetrics and Gynecology | Admitting: Obstetrics and Gynecology

## 2016-06-24 ENCOUNTER — Other Ambulatory Visit: Payer: Self-pay | Admitting: Obstetrics and Gynecology

## 2016-06-24 ENCOUNTER — Encounter (HOSPITAL_COMMUNITY): Payer: Self-pay

## 2016-06-24 VITALS — BP 114/70 | HR 81 | Wt 196.6 lb

## 2016-06-24 DIAGNOSIS — Z3491 Encounter for supervision of normal pregnancy, unspecified, first trimester: Secondary | ICD-10-CM | POA: Diagnosis not present

## 2016-06-24 DIAGNOSIS — Z3689 Encounter for other specified antenatal screening: Secondary | ICD-10-CM

## 2016-06-24 DIAGNOSIS — Z3A00 Weeks of gestation of pregnancy not specified: Secondary | ICD-10-CM | POA: Insufficient documentation

## 2016-06-24 DIAGNOSIS — Z36 Encounter for antenatal screening of mother: Secondary | ICD-10-CM | POA: Diagnosis not present

## 2016-06-25 ENCOUNTER — Ambulatory Visit: Payer: Medicaid Other

## 2016-06-29 ENCOUNTER — Encounter: Payer: Medicaid Other | Attending: Obstetrics and Gynecology | Admitting: Skilled Nursing Facility1

## 2016-06-29 ENCOUNTER — Ambulatory Visit (HOSPITAL_COMMUNITY): Payer: Medicaid Other

## 2016-06-29 DIAGNOSIS — O24410 Gestational diabetes mellitus in pregnancy, diet controlled: Secondary | ICD-10-CM | POA: Insufficient documentation

## 2016-06-29 DIAGNOSIS — Z713 Dietary counseling and surveillance: Secondary | ICD-10-CM | POA: Insufficient documentation

## 2016-07-01 ENCOUNTER — Encounter: Payer: Self-pay | Admitting: Skilled Nursing Facility1

## 2016-07-01 NOTE — Progress Notes (Signed)
  Patient was seen on 06/29/2016 for Gestational Diabetes self-management class at the [facility]. The following learning objectives were met by the patient during this course:   States the definition of Gestational Diabetes  States why dietary management is important in controlling blood glucose  Describes the effects each nutrient has on blood glucose levels  Demonstrates ability to create a balanced meal plan  Demonstrates carbohydrate counting   States when to check blood glucose levels involving a total of 4 separate occurences in a day  Demonstrates proper blood glucose monitoring techniques  States the effect of stress and exercise on blood glucose levels  States the importance of limiting caffeine and abstaining from alcohol and smoking  Demonstrates the knowledge the glucometer provided in class may not be covered by their insurance and to call their insurance provider immediately after class to know which glucometer their insurance provider does cover as well as calling their physician the next day for a prescription to the glucometer their insurance does cover (if the one provided is not) as well as the lancets and strips for that meter.  Blood glucose monitor given: already testing the pt has her own Blood glucose reading: 76  Patient instructed to monitor glucose levels: FBS: 60 - <90 1 hour: <140 2 hour: <120  *Patient received handouts:  Nutrition Diabetes and Pregnancy  Carbohydrate Counting List  Patient will be seen for follow-up as needed.

## 2016-07-08 ENCOUNTER — Ambulatory Visit (INDEPENDENT_AMBULATORY_CARE_PROVIDER_SITE_OTHER): Payer: Medicaid Other | Admitting: Obstetrics and Gynecology

## 2016-07-08 VITALS — BP 100/66 | HR 85 | Temp 98.5°F | Wt 196.1 lb

## 2016-07-08 DIAGNOSIS — Z3492 Encounter for supervision of normal pregnancy, unspecified, second trimester: Secondary | ICD-10-CM | POA: Diagnosis not present

## 2016-07-08 DIAGNOSIS — O99210 Obesity complicating pregnancy, unspecified trimester: Secondary | ICD-10-CM | POA: Diagnosis not present

## 2016-07-08 DIAGNOSIS — Z331 Pregnant state, incidental: Secondary | ICD-10-CM | POA: Diagnosis not present

## 2016-07-08 DIAGNOSIS — O24410 Gestational diabetes mellitus in pregnancy, diet controlled: Secondary | ICD-10-CM

## 2016-07-08 DIAGNOSIS — Z1389 Encounter for screening for other disorder: Secondary | ICD-10-CM | POA: Diagnosis not present

## 2016-07-08 LAB — POCT URINALYSIS DIPSTICK
Bilirubin, UA: NEGATIVE
Blood, UA: NEGATIVE
Glucose, UA: NEGATIVE
Ketones, UA: NEGATIVE
Leukocytes, UA: NEGATIVE
Nitrite, UA: NEGATIVE
Protein, UA: NEGATIVE
Spec Grav, UA: 1.02
Urobilinogen, UA: 0.2
pH, UA: 5

## 2016-07-08 NOTE — Progress Notes (Signed)
   PRENATAL VISIT NOTE  Subjective:  Marcia Bryant is a 30 y.o. [phone] at 6231w4d being seen today for ongoing prenatal care.  She is currently monitored for the following issues for this high-risk pregnancy and has Supervision of normal pregnancy; BMI 33.0-33.9,adult; Obesity in pregnancy; History of pre-eclampsia; History of shoulder dystocia in prior pregnancy; Umbilical hernia; failed early 1h (154); and Gestational diabetes mellitus (GDM), antepartum on her problem list.  Patient reports no complaints.  Contractions: Not present. Vag. Bleeding: None.   . Denies leaking of fluid.   The following portions of the patient's history were reviewed and updated as appropriate: allergies, current medications, past family history, past medical history, past social history, past surgical history and problem list. Problem list updated.  Objective:   Vitals:   07/08/16 1012  BP: 100/66  Pulse: 85  Temp: 98.5 F (36.9 C)  Weight: 196 lb 1.6 oz (89 kg)    Fetal Status: Fetal Heart Rate (bpm): 154         General:  Alert, oriented and cooperative. Patient is in no acute distress.  Skin: Skin is warm and dry. No rash noted.   Cardiovascular: Normal heart rate noted  Respiratory: Normal respiratory effort, no problems with respiration noted  Abdomen: Soft, gravid, appropriate for gestational age. Pain/Pressure: Absent     Pelvic:  Cervical exam deferred        Extremities: Normal range of motion.  Edema: None  Mental Status: Normal mood and affect. Normal behavior. Normal judgment and thought content.   Urinalysis:      Assessment and Plan:  Pregnancy: [id number] at 2731w4d  1. Supervision of normal pregnancy, second trimester Patient is doing well without complaints Quad screen today Anatomy ultrasound already scheduled  2. Obesity in pregnancy   3. Diet controlled gestational diabetes mellitus (GDM), antepartum CBGs reviewed and great majority within range. 2 elevated p breakfast  values of 127 and 133 during the first 2 days of testing. All other values normal  I congratulated the patient on her efforts and encouraged her to continue  General obstetric precautions including but not limited to vaginal bleeding, contractions, leaking of fluid and fetal movement were reviewed in detail with the patient. Please refer to After Visit Summary for other counseling recommendations.  No Follow-up on file.  Catalina AntiguaPeggy Francys Bolin, MD

## 2016-07-08 NOTE — Addendum Note (Signed)
Addended by: Francene FindersJAMES, QUINETTA C on: 07/08/2016 11:37 AM   Modules accepted: Orders

## 2016-07-09 ENCOUNTER — Encounter: Payer: Self-pay | Admitting: *Deleted

## 2016-07-09 ENCOUNTER — Other Ambulatory Visit: Payer: Self-pay | Admitting: Obstetrics and Gynecology

## 2016-07-15 ENCOUNTER — Telehealth: Payer: Self-pay | Admitting: *Deleted

## 2016-07-15 LAB — AFP, QUAD SCREEN
DIA Mom Value: 0.35
DIA Value (EIA): 57.08 pg/mL
DSR (BY AGE) 1 IN: 757
DSR (SECOND TRIMESTER) 1 IN: 10000
Gestational Age: 15.4 WEEKS
MSAFP MOM: 2.42
MSAFP: 67 ng/mL
MSHCG Mom: 0.33
MSHCG: 13244 m[IU]/mL
Maternal Age At EDD: 29.3 YEARS
OSB RISK: 671
PDF: 0
Test Results:: NEGATIVE
UE3 MOM: 1.72
WEIGHT: 196 [lb_av]
uE3 Value: 1.08 ng/mL

## 2016-07-15 NOTE — Telephone Encounter (Signed)
Missing information from Quad Screen called to [facility].

## 2016-07-17 ENCOUNTER — Encounter: Payer: Self-pay | Admitting: *Deleted

## 2016-07-17 ENCOUNTER — Ambulatory Visit (HOSPITAL_COMMUNITY)
Admission: RE | Admit: 2016-07-17 | Discharge: 2016-07-17 | Disposition: A | Discharge status: Home / Self Care | Payer: Medicaid Other | Source: Ambulatory Visit | Attending: Obstetrics and Gynecology | Admitting: Obstetrics and Gynecology

## 2016-07-17 DIAGNOSIS — Z36 Encounter for antenatal screening of mother: Secondary | ICD-10-CM | POA: Insufficient documentation

## 2016-07-17 DIAGNOSIS — Z3A18 18 weeks gestation of pregnancy: Secondary | ICD-10-CM | POA: Insufficient documentation

## 2016-07-17 DIAGNOSIS — O09292 Supervision of pregnancy with other poor reproductive or obstetric history, second trimester: Secondary | ICD-10-CM | POA: Insufficient documentation

## 2016-07-17 DIAGNOSIS — O99212 Obesity complicating pregnancy, second trimester: Secondary | ICD-10-CM | POA: Diagnosis not present

## 2016-07-17 DIAGNOSIS — Z3689 Encounter for other specified antenatal screening: Secondary | ICD-10-CM

## 2016-08-06 ENCOUNTER — Ambulatory Visit (INDEPENDENT_AMBULATORY_CARE_PROVIDER_SITE_OTHER): Payer: Medicaid Other | Admitting: Obstetrics and Gynecology

## 2016-08-06 VITALS — BP 111/73 | HR 86 | Temp 98.8°F | Wt 198.2 lb

## 2016-08-06 DIAGNOSIS — K429 Umbilical hernia without obstruction or gangrene: Secondary | ICD-10-CM

## 2016-08-06 DIAGNOSIS — O24410 Gestational diabetes mellitus in pregnancy, diet controlled: Secondary | ICD-10-CM | POA: Diagnosis not present

## 2016-08-06 DIAGNOSIS — Z3492 Encounter for supervision of normal pregnancy, unspecified, second trimester: Secondary | ICD-10-CM | POA: Diagnosis not present

## 2016-08-06 DIAGNOSIS — Z23 Encounter for immunization: Secondary | ICD-10-CM | POA: Diagnosis not present

## 2016-08-06 DIAGNOSIS — Z8759 Personal history of other complications of pregnancy, childbirth and the puerperium: Secondary | ICD-10-CM

## 2016-08-06 NOTE — Progress Notes (Signed)
Subjective:  Marcia Bryant is a 30 y.o. [phone] at 2717w5d being seen today for ongoing prenatal care.  She is currently monitored for the following issues for this high-risk pregnancy and has Supervision of normal pregnancy; BMI 33.0-33.9,adult; Obesity in pregnancy; History of pre-eclampsia; History of shoulder dystocia in prior pregnancy; Umbilical hernia; failed early 1h (154); and Gestational diabetes mellitus (GDM), antepartum on her problem list.  Patient reports no complaints.  Contractions: Not present. Vag. Bleeding: None.   . Denies leaking of fluid.   The following portions of the patient's history were reviewed and updated as appropriate: allergies, current medications, past family history, past medical history, past social history, past surgical history and problem list. Problem list updated.  Objective:   Vitals:   08/06/16 1023  BP: 111/73  Pulse: 86  Temp: 98.8 F (37.1 C)  Weight: 198 lb 3.2 oz (89.9 kg)    Fetal Status:           General:  Alert, oriented and cooperative. Patient is in no acute distress.  Skin: Skin is warm and dry. No rash noted.   Cardiovascular: Normal heart rate noted  Respiratory: Normal respiratory effort, no problems with respiration noted  Abdomen: Soft, gravid, appropriate for gestational age. Pain/Pressure: Absent     Pelvic:  Cervical exam deferred        Extremities: Normal range of motion.  Edema: None  Mental Status: Normal mood and affect. Normal behavior. Normal judgment and thought content.   Urinalysis:      Assessment and Plan:  Pregnancy: [id number] at 3417w5d  1. Diet controlled gestational diabetes mellitus (GDM), antepartum BS are in goal range. Continue with current management   2. Supervision of normal pregnancy, second trimester Flu vaccine today  3. History of pre-eclampsia On BASA  4. Umbilical hernia without obstruction and without gangrene Stable  Preterm labor symptoms and general obstetric precautions  including but not limited to vaginal bleeding, contractions, leaking of fluid and fetal movement were reviewed in detail with the patient. Please refer to After Visit Summary for other counseling recommendations.  Return in about 4 weeks (around 09/03/2016) for OB visit.   Hermina StaggersMichael L Ervin, MD

## 2016-08-06 NOTE — Progress Notes (Signed)
Patient states that she is overall feeling well. 

## 2016-09-02 ENCOUNTER — Inpatient Hospital Stay (HOSPITAL_COMMUNITY)
Admission: AD | Admit: 2016-09-02 | Discharge: 2016-09-02 | Disposition: A | Discharge status: Home / Self Care | Payer: Medicaid Other | Source: Ambulatory Visit | Attending: Family Medicine | Admitting: Family Medicine

## 2016-09-02 ENCOUNTER — Encounter (HOSPITAL_COMMUNITY): Payer: Self-pay

## 2016-09-02 DIAGNOSIS — R1030 Lower abdominal pain, unspecified: Secondary | ICD-10-CM | POA: Diagnosis present

## 2016-09-02 DIAGNOSIS — O9989 Other specified diseases and conditions complicating pregnancy, childbirth and the puerperium: Secondary | ICD-10-CM | POA: Diagnosis not present

## 2016-09-02 DIAGNOSIS — O26892 Other specified pregnancy related conditions, second trimester: Secondary | ICD-10-CM | POA: Insufficient documentation

## 2016-09-02 DIAGNOSIS — R102 Pelvic and perineal pain: Secondary | ICD-10-CM | POA: Insufficient documentation

## 2016-09-02 DIAGNOSIS — N949 Unspecified condition associated with female genital organs and menstrual cycle: Secondary | ICD-10-CM

## 2016-09-02 DIAGNOSIS — Z7982 Long term (current) use of aspirin: Secondary | ICD-10-CM | POA: Diagnosis not present

## 2016-09-02 DIAGNOSIS — Z3A25 25 weeks gestation of pregnancy: Secondary | ICD-10-CM | POA: Diagnosis not present

## 2016-09-02 LAB — URINALYSIS, ROUTINE W REFLEX MICROSCOPIC
Bilirubin Urine: NEGATIVE
GLUCOSE, UA: NEGATIVE mg/dL
HGB URINE DIPSTICK: NEGATIVE
Ketones, ur: NEGATIVE mg/dL
Nitrite: NEGATIVE
Protein, ur: NEGATIVE mg/dL
SPECIFIC GRAVITY, URINE: 1.01 (ref 1.005–1.030)
pH: 7.5 (ref 5.0–8.0)

## 2016-09-02 LAB — URINE MICROSCOPIC-ADD ON

## 2016-09-02 NOTE — MAU Provider Note (Signed)
History     CSN: [id number]  Arrival date and time: 09/02/16 1407   First Provider Initiated Contact with Patient 09/02/16 1514      Chief Complaint  Patient presents with  . Abdominal Cramping   HPI   Marcia Bryant is a 30 y.o. female [phone] @ 60w4dhere [place] abdominal pain. The pain is located in the center of her lower abdomen, and sometimes she feels It in the middle part of her abdomen. The pain comes and goes. When she is laying down her pain is gone, when she gets up to walk/run she feels the pain worsens.   OB History    Gravida Para Term Preterm AB Living   '4 2 2   1 2   '$ SAB TAB Ectopic Multiple Live Births   1       2      Past Medical History:  Diagnosis Date  . Bacterial vaginosis   . Preeclampsia 01/25/2013  . Umbilical hernia 72/64/1583 . Yeast infection     History reviewed. No pertinent surgical history.  Family History  Problem Relation Age of Onset  . Hypertension Father   . Hypertension Paternal Grandmother     Social History  Substance Use Topics  . Smoking status: Never Smoker  . Smokeless tobacco: Never Used  . Alcohol use No    Allergies: No Known Allergies  Prescriptions Prior to Admission  Medication Sig Dispense Refill Last Dose  . ACCU-CHEK FASTCLIX LANCETS MISC USE FOUR TIMES DAILY 306 each 3 Taking  . aspirin EC 81 MG tablet Take 1 tablet (81 mg total) by mouth daily. 30 tablet 6 Taking  . Blood Glucose Monitoring Suppl (ACCU-CHEK NANO SMARTVIEW) w/Device KIT 1 Device by Does not apply route 4 (four) times daily. 1 kit 0 Taking  . glucose blood (ACCU-CHEK SMARTVIEW) test strip Please use 4 times daily 51 each 12 Taking  . Prenatal Vit-Fe Fumarate-FA (PRENATAL MULTIVITAMIN) TABS tablet Take 1 tablet by mouth daily at 12 noon.   Taking   Results for orders placed or performed during the hospital encounter of 09/02/16 (from the past 48 hour(s))  [facility]inal[facility], [facility] reflex [facility]      Status: Abnormal   Collection Time: 09/02/16  2:35 PM  Result Value Ref Range   Color, Urine YELLOW YELLOW   APPearance CLEAR CLEAR   Specific Gravity, Urine 1.010 1.005 - 1.030   pH 7.5 5.0 - 8.0   Glucose, UA NEGATIVE NEGATIVE mg/dL   Hgb urine dipstick NEGATIVE NEGATIVE   Bilirubin Urine NEGATIVE NEGATIVE   Ketones, ur NEGATIVE NEGATIVE mg/dL   Protein, ur NEGATIVE NEGATIVE mg/dL   Nitrite NEGATIVE NEGATIVE   Leukocytes, UA SMALL (A) NEGATIVE  Urine microscopic-add on     Status: Abnormal   Collection Time: 09/02/16  2:35 PM  Result Value Ref Range   Squamous Epithelial / LPF 6-30 (A) NONE SEEN   WBC, UA 0-5 0 - 5 WBC/hpf   RBC / HPF 0-5 0 - 5 RBC/hpf   Bacteria, UA MANY (A) NONE SEEN   Urine-Other MUCOUS PRESENT    Review of Systems  Constitutional: Negative for chills and fever.  Gastrointestinal: Negative for abdominal pain, nausea and vomiting.  Genitourinary: Negative for dysuria and urgency.   Physical Exam   Blood pressure 116/59, pulse 85, temperature 98.4 F (36.9 C), temperature source Oral, resp. rate 17, height '5\' 4"'$  (1.626 m), weight 200 lb 8 oz (90.9  kg), last menstrual period 03/30/2016, SpO2 100 %.  Physical Exam  Constitutional: She is oriented to person, place, and time. She appears well-developed and well-nourished. No distress.  HENT:  Head: Normocephalic.  Eyes: Pupils are equal, round, and reactive to light.  Respiratory: Effort normal.  GI: Normal appearance. There is tenderness in the suprapubic area. There is no rigidity, no rebound and no guarding.  Genitourinary:  Genitourinary Comments: Cervix: closed, thick, posterior   Musculoskeletal: Normal range of motion.  Neurological: She is alert and oriented to person, place, and time.  Skin: Skin is warm. She is not diaphoretic.  Psychiatric: Her behavior is normal.    Fetal Tracing: Baseline: 140 bpm  Variability: Moderate  Accelerations: 15x15 Decelerations: none Toco: None   MAU Course   Procedures  None  MDM  Urine culture pending   Assessment and Plan   A:  1. Round ligament pain     P:  Discharge home in stable condition Pregnancy support belt recommended Return precautions discussed  Follow up with OB as needed or as scheduled.    Lezlie Lye, NP 09/02/2016 4:04 PM

## 2016-09-02 NOTE — MAU Note (Signed)
Pt c/o cramping starting this morning. The cramping gets worse when she is up and moving around. Pt denies bleeding and leaking of fluid. Pt states the baby is moving normally.

## 2016-09-02 NOTE — Discharge Instructions (Signed)
Round Ligament Pain  The round ligament is a cord of muscle and tissue that helps to support the uterus. It can become a source of pain during pregnancy if it becomes stretched or twisted as the baby grows. The pain usually begins in the second trimester of pregnancy, and it can come and go until the baby is delivered. It is not a serious problem, and it does not cause harm to the baby.  Round ligament pain is usually a short, sharp, and pinching pain, but it can also be a dull, lingering, and aching pain. The pain is felt in the lower side of the abdomen or in the groin. It usually starts deep in the groin and moves up to the outside of the hip area. Pain can occur with:   A sudden change in position.   Rolling over in bed.   Coughing or sneezing.   Physical activity.  HOME CARE INSTRUCTIONS  Watch your condition for any changes. Take these steps to help with your pain:   When the pain starts, relax. Then try:    Sitting down.    Flexing your knees up to your abdomen.    Lying on your side with one pillow under your abdomen and another pillow between your legs.    Sitting in a warm bath for 15-20 minutes or until the pain goes away.   Take over-the-counter and prescription medicines only as told by your health care provider.   Move slowly when you sit and stand.   Avoid long walks if they cause pain.   Stop or lessen your physical activities if they cause pain.  SEEK MEDICAL CARE IF:   Your pain does not go away with treatment.   You feel pain in your back that you did not have before.   Your medicine is not helping.  SEEK IMMEDIATE MEDICAL CARE IF:   You develop a fever or chills.   You develop uterine contractions.   You develop vaginal bleeding.   You develop nausea or vomiting.   You develop diarrhea.   You have pain when you urinate.     This information is not intended to replace advice given to you by your health care provider. Make sure you discuss any questions you have with your health  care provider.     Document Released: 08/04/2008 Document Revised: 01/18/2012 Document Reviewed: 01/02/2015  Elsevier Interactive Patient Education 2016 Elsevier Inc[employer]

## 2016-09-03 ENCOUNTER — Ambulatory Visit (INDEPENDENT_AMBULATORY_CARE_PROVIDER_SITE_OTHER): Payer: Medicaid Other | Admitting: Obstetrics & Gynecology

## 2016-09-03 VITALS — BP 113/74 | HR 83 | Temp 97.3°F | Wt 204.0 lb

## 2016-09-03 DIAGNOSIS — Z3482 Encounter for supervision of other normal pregnancy, second trimester: Secondary | ICD-10-CM

## 2016-09-03 DIAGNOSIS — O24410 Gestational diabetes mellitus in pregnancy, diet controlled: Secondary | ICD-10-CM

## 2016-09-03 LAB — CULTURE, OB URINE: SPECIAL REQUESTS: NORMAL

## 2016-09-03 NOTE — Progress Notes (Signed)
Patient is in the office for appt, reports feeling good with good fetal movement.

## 2016-09-03 NOTE — Progress Notes (Signed)
   PRENATAL VISIT NOTE  Subjective:  Marcia Bryant is a 30 y.o. [phone] at 7229w5d being seen today for ongoing prenatal care.  She is currently monitored for the following issues for this high-risk pregnancy and has Supervision of normal pregnancy; BMI 33.0-33.9,adult; Obesity in pregnancy; History of pre-eclampsia; History of shoulder dystocia in prior pregnancy; Umbilical hernia; failed early 1h (154); and Gestational diabetes mellitus (GDM), antepartum on her problem list.  Patient reports no complaints.  Contractions: Not present. Vag. Bleeding: None.  Movement: Present. Denies leaking of fluid.   The following portions of the patient's history were reviewed and updated as appropriate: allergies, current medications, past family history, past medical history, past social history, past surgical history and problem list. Problem list updated.  Objective:   Vitals:   09/03/16 0819  BP: 113/74  Pulse: 83  Temp: 97.3 F (36.3 C)  Weight: 92.5 kg (204 lb)    Fetal Status: Fetal Heart Rate (bpm): 158   Movement: Present     General:  Alert, oriented and cooperative. Patient is in no acute distress.  Skin: Skin is warm and dry. No rash noted.   Cardiovascular: Normal heart rate noted  Respiratory: Normal respiratory effort, no problems with respiration noted  Abdomen: Soft, gravid, appropriate for gestational age. Pain/Pressure: Absent     Pelvic:  Cervical exam deferred        Extremities: Normal range of motion.  Edema: None  Mental Status: Normal mood and affect. Normal behavior. Normal judgment and thought content.   Assessment and Plan:  Pregnancy: [id number] at 4029w5d  1. Encounter for supervision of other normal pregnancy in second trimester  - US MFM OB FOLLOW UP; Future Patient Active Problem List   Diagnosis Date Noted  . Gestational diabetes mellitus (GDM), antepartum 06/15/2016  . failed early 1h (154) 05/25/2016  . Supervision of normal pregnancy 05/20/2016  .  BMI 33.0-33.9,adult 05/20/2016  . Obesity in pregnancy 05/20/2016  . History of pre-eclampsia 05/20/2016  . History of shoulder dystocia in prior pregnancy 05/20/2016  . Umbilical hernia 05/20/2016   FBS and pp < 90%in range, continue diet control, needs growth US start 28 weeks, discussed with Dr Claudean SeveranceWhitecar Preterm labor symptoms and general obstetric precautions including but not limited to vaginal bleeding, contractions, leaking of fluid and fetal movement were reviewed in detail with the patient. Please refer to After Visit Summary for other counseling recommendations.  Return in about 2 weeks (around 09/17/2016).  Adam PhenixJames G Stedman [place], MD

## 2016-09-03 NOTE — Patient Instructions (Signed)

## 2016-09-11 ENCOUNTER — Ambulatory Visit (HOSPITAL_COMMUNITY)
Admission: RE | Admit: 2016-09-11 | Discharge: 2016-09-11 | Disposition: A | Discharge status: Home / Self Care | Payer: Medicaid Other | Source: Ambulatory Visit | Attending: Obstetrics & Gynecology | Admitting: Obstetrics & Gynecology

## 2016-09-11 ENCOUNTER — Encounter (HOSPITAL_COMMUNITY): Payer: Self-pay

## 2016-09-11 DIAGNOSIS — O24410 Gestational diabetes mellitus in pregnancy, diet controlled: Secondary | ICD-10-CM | POA: Insufficient documentation

## 2016-09-11 DIAGNOSIS — Z3482 Encounter for supervision of other normal pregnancy, second trimester: Secondary | ICD-10-CM

## 2016-09-11 DIAGNOSIS — O09292 Supervision of pregnancy with other poor reproductive or obstetric history, second trimester: Secondary | ICD-10-CM | POA: Diagnosis not present

## 2016-09-11 DIAGNOSIS — Z3A26 26 weeks gestation of pregnancy: Secondary | ICD-10-CM | POA: Insufficient documentation

## 2016-09-11 DIAGNOSIS — O99212 Obesity complicating pregnancy, second trimester: Secondary | ICD-10-CM | POA: Insufficient documentation

## 2016-09-17 ENCOUNTER — Ambulatory Visit (INDEPENDENT_AMBULATORY_CARE_PROVIDER_SITE_OTHER): Payer: Medicaid Other | Admitting: Obstetrics & Gynecology

## 2016-09-17 VITALS — BP 96/66 | HR 81 | Temp 98.3°F | Wt 203.0 lb

## 2016-09-17 DIAGNOSIS — O0992 Supervision of high risk pregnancy, unspecified, second trimester: Secondary | ICD-10-CM

## 2016-09-17 DIAGNOSIS — O2242 Hemorrhoids in pregnancy, second trimester: Secondary | ICD-10-CM

## 2016-09-17 DIAGNOSIS — O24410 Gestational diabetes mellitus in pregnancy, diet controlled: Secondary | ICD-10-CM

## 2016-09-17 MED ORDER — HYDROCORTISONE 2.5 % RE CREA: 1.0000 "application " | TOPICAL_CREAM | Freq: Two times a day (BID) | RECTAL | 2 refills | Status: DC

## 2016-09-17 NOTE — Progress Notes (Signed)
   PRENATAL VISIT NOTE  Subjective:  Marcia Bryant is a 30 y.o. [phone] at 3670w5d being seen today for ongoing prenatal care.  She is currently monitored for the following issues for this high-risk pregnancy and has Supervision of high-risk pregnancy; BMI 33.0-33.9,adult; Obesity in pregnancy; History of pre-eclampsia; History of shoulder dystocia in prior pregnancy; Umbilical hernia; failed early 1h (154); and Gestational diabetes mellitus (GDM), antepartum on her problem list.  Patient reports painful hemorrhoids, wants medication to help this.  Contractions: Not present. Vag. Bleeding: None.  Movement: Present. Denies leaking of fluid.   The following portions of the patient's history were reviewed and updated as appropriate: allergies, current medications, past family history, past medical history, past social history, past surgical history and problem list. Problem list updated.  Objective:   Vitals:   09/17/16 1321  BP: 96/66  Pulse: 81  Temp: 98.3 F (36.8 C)  Weight: 203 lb (92.1 kg)    Fetal Status: Fetal Heart Rate (bpm): 146 Fundal Height: 29 cm Movement: Present     General:  Alert, oriented and cooperative. Patient is in no acute distress.  Skin: Skin is warm and dry. No rash noted.   Cardiovascular: Normal heart rate noted  Respiratory: Normal respiratory effort, no problems with respiration noted  Abdomen: Soft, gravid, appropriate for gestational age. Pain/Pressure: Absent     Pelvic:  Cervical exam deferred        Extremities: Normal range of motion.  Edema: None  Mental Status: Normal mood and affect. Normal behavior. Normal judgment and thought content.   Assessment and Plan:  Pregnancy: [id number] at 2370w5d  1. Diet controlled gestational diabetes mellitus (GDM), antepartum Blood sugars are within good range, continue diet control.  11/3 EFW 71%, normal AFV. Repeat at 38 weeks or as needed.  2. Hemorrhoids during pregnancy, antepartum, second  trimester Anusol prescribed as needed - hydrocortisone (ANUSOL-HC) 2.5 % rectal cream; Place 1 application rectally 2 (two) times daily.  Dispense: 30 g; Refill: 2  3. Supervision of high risk pregnancy in second trimester Preterm labor symptoms and general obstetric precautions including but not limited to vaginal bleeding, contractions, leaking of fluid and fetal movement were reviewed in detail with the patient. Please refer to After Visit Summary for other counseling recommendations.  Return in about 2 weeks (around 10/01/2016) for 3rd trimester labs, OB Visit, TDap.   Tereso NewcomerUgonna A Jamaurie Bernier, MD

## 2016-09-17 NOTE — Patient Instructions (Signed)
Return to clinic for any scheduled appointments or obstetric concerns, or go to [facility] for evaluation  Tdap Vaccine (Tetanus, Diphtheria and Pertussis): What You Need to Know 1. Why get vaccinated? Tetanus, diphtheria and pertussis are very serious diseases. Tdap vaccine can protect us from these diseases. And, Tdap vaccine given to pregnant women can protect newborn babies against pertussis. TETANUS (Lockjaw) is rare in the [place] today. It causes painful muscle tightening and stiffness, usually all over the body.  It can lead to tightening of muscles in the head and neck so you can't open your mouth, swallow, or sometimes even breathe. Tetanus kills about 1 out of 10 people who are infected even after receiving the best medical care. DIPHTHERIA is also rare in the [place] today. It can cause a thick coating to form in the back of the throat.  It can lead to breathing problems, heart failure, paralysis, and death. PERTUSSIS (Whooping Cough) causes severe coughing spells, which can cause difficulty breathing, vomiting and disturbed sleep.  It can also lead to weight loss, incontinence, and rib fractures. Up to 2 in 100 adolescents and 5 in 100 adults with pertussis are hospitalized or have complications, which could include pneumonia or death. These diseases are caused by bacteria. Diphtheria and pertussis are spread from person to person through secretions from coughing or sneezing. Tetanus enters the body through cuts, scratches, or wounds. Before vaccines, as many as 200,000 cases of diphtheria, 200,000 cases of pertussis, and hundreds of cases of tetanus, were reported in the [place] each year. Since vaccination began, reports of cases for tetanus and diphtheria have dropped by about 99% and for pertussis by about 80%. 2. Tdap vaccine Tdap vaccine can protect adolescents and adults from tetanus, diphtheria, and pertussis. One dose of Tdap is routinely given at age 11 or 12.  People who did not get Tdap at that age should get it as soon as possible. Tdap is especially important for healthcare professionals and anyone having close contact with a baby younger than 12 months. Pregnant women should get a dose of Tdap during every pregnancy, to protect the newborn from pertussis. Infants are most at risk for severe, life-threatening complications from pertussis. Another vaccine, called Td, protects against tetanus and diphtheria, but not pertussis. A Td booster should be given every 10 years. Tdap may be given as one of these boosters if you have never gotten Tdap before. Tdap may also be given after a severe cut or burn to prevent tetanus infection. Your doctor or the person giving you the vaccine can give you more information. Tdap may safely be given at the same time as other vaccines. 3. Some people should not get this vaccine  A person who has ever had a life-threatening allergic reaction after a previous dose of any diphtheria, tetanus or pertussis containing vaccine, OR has a severe allergy to any part of this vaccine, should not get Tdap vaccine. Tell the person giving the vaccine about any severe allergies.  Anyone who had coma or long repeated seizures within 7 days after a childhood dose of DTP or DTaP, or a previous dose of Tdap, should not get Tdap, unless a cause other than the vaccine was found. They can still get Td.  Talk to your doctor if you:  have seizures or another nervous system problem,  had severe pain or swelling after any vaccine containing diphtheria, tetanus or pertussis,  ever had a condition called Guillain-Barr Syndrome (GBS),  aren't feeling   well on the day the shot is scheduled. 4. Risks With any medicine, including vaccines, there is a chance of side effects. These are usually mild and go away on their own. Serious reactions are also possible but are rare. Most people who get Tdap vaccine do not have any problems with it. Mild  problems following Tdap (Did not interfere with activities)  Pain where the shot was given (about 3 in 4 adolescents or 2 in 3 adults)  Redness or swelling where the shot was given (about 1 person in 5)  Mild fever of at least 100.4F (up to about 1 in 25 adolescents or 1 in 100 adults)  Headache (about 3 or 4 people in 10)  Tiredness (about 1 person in 3 or 4)  Nausea, vomiting, diarrhea, stomach ache (up to 1 in 4 adolescents or 1 in 10 adults)  Chills, sore joints (about 1 person in 10)  Body aches (about 1 person in 3 or 4)  Rash, swollen glands (uncommon) Moderate problems following Tdap (Interfered with activities, but did not require medical attention)  Pain where the shot was given (up to 1 in 5 or 6)  Redness or swelling where the shot was given (up to about 1 in 16 adolescents or 1 in 12 adults)  Fever over 102F (about 1 in 100 adolescents or 1 in 250 adults)  Headache (about 1 in 7 adolescents or 1 in 10 adults)  Nausea, vomiting, diarrhea, stomach ache (up to 1 or 3 people in 100)  Swelling of the entire arm where the shot was given (up to about 1 in 500). Severe problems following Tdap (Unable to perform usual activities; required medical attention)  Swelling, severe pain, bleeding and redness in the arm where the shot was given (rare). Problems that could happen after any vaccine:  People sometimes faint after a medical procedure, including vaccination. Sitting or lying down for about 15 minutes can help prevent fainting, and injuries caused by a fall. Tell your doctor if you feel dizzy, or have vision changes or ringing in the ears.  Some people get severe pain in the shoulder and have difficulty moving the arm where a shot was given. This happens very rarely.  Any medication can cause a severe allergic reaction. Such reactions from a vaccine are very rare, estimated at fewer than 1 in a million doses, and would happen within a few minutes to a few hours  after the vaccination. As with any medicine, there is a very remote chance of a vaccine causing a serious injury or death. The safety of vaccines is always being monitored. For more information, visit: www.cdc.gov/vaccinesafety/ 5. What if there is a serious problem? What should I look for?  Look for anything that concerns you, such as signs of a severe allergic reaction, very high fever, or unusual behavior.  Signs of a severe allergic reaction can include hives, swelling of the face and throat, difficulty breathing, a fast heartbeat, dizziness, and weakness. These would usually start a few minutes to a few hours after the vaccination. What should I do?  If you think it is a severe allergic reaction or other emergency that can't wait, call 9-1-1 or get the person to the nearest hospital. Otherwise, call your doctor.  Afterward, the reaction should be reported to the Vaccine Adverse Event Reporting System (VAERS). Your doctor might file this report, or you can do it yourself through the VAERS web site at www.vaers.hhs.gov, or by calling 1-800-822-7967. VAERS does not   give medical advice.  6. The National Vaccine Injury Compensation Program The National Vaccine Injury Compensation Program (VICP) is a federal program that was created to compensate people who may have been injured by certain vaccines. Persons who believe they may have been injured by a vaccine can learn about the program and about filing a claim by calling 1-800-338-2382 or visiting the VICP website at www.hrsa.gov/vaccinecompensation. There is a time limit to file a claim for compensation. 7. How can I learn more?  Ask your doctor. He or she can give you the vaccine package insert or suggest other sources of information.  Call your local or state health department.  Contact the Centers for Disease Control and Prevention (CDC):  Call 1-800-232-4636 (1-800-CDC-INFO) or  Visit CDC's website at www.cdc.gov/vaccines CDC Tdap  Vaccine VIS (01/02/14)   This information is not intended to replace advice given to you by your health care provider. Make sure you discuss any questions you have with your health care provider.   Document Released: 04/26/2012 Document Revised: 11/16/2014 Document Reviewed: 02/07/2014 Elsevier Interactive Patient Education 2016 Elsevier Inc.  

## 2016-10-07 ENCOUNTER — Ambulatory Visit (INDEPENDENT_AMBULATORY_CARE_PROVIDER_SITE_OTHER): Payer: Medicaid Other | Admitting: Obstetrics and Gynecology

## 2016-10-07 VITALS — BP 111/71 | HR 89 | Temp 98.2°F | Wt 203.8 lb

## 2016-10-07 DIAGNOSIS — Z23 Encounter for immunization: Secondary | ICD-10-CM | POA: Diagnosis not present

## 2016-10-07 DIAGNOSIS — Z8759 Personal history of other complications of pregnancy, childbirth and the puerperium: Secondary | ICD-10-CM

## 2016-10-07 DIAGNOSIS — O24410 Gestational diabetes mellitus in pregnancy, diet controlled: Secondary | ICD-10-CM

## 2016-10-07 DIAGNOSIS — O0993 Supervision of high risk pregnancy, unspecified, third trimester: Secondary | ICD-10-CM

## 2016-10-07 NOTE — Progress Notes (Signed)
Subjective:  Marcia Bryant is a 30 y.o. [id number] at 25109w4d being seen today for ongoing prenatal care.  She is currently monitored for the following issues for this high-risk pregnancy and has Supervision of high-risk pregnancy; BMI 33.0-33.9,adult; Obesity in pregnancy; History of pre-eclampsia; History of shoulder dystocia in prior pregnancy; Umbilical hernia; failed early 1h (154); and Gestational diabetes mellitus (GDM), antepartum on her problem list.  Patient reports no complaints.  Contractions: Not present. Vag. Bleeding: None.  Movement: Present. Denies leaking of fluid.   The following portions of the patient's history were reviewed and updated as appropriate: allergies, current medications, past family history, past medical history, past social history, past surgical history and problem list. Problem list updated.  Objective:   Vitals:   10/07/16 0823  BP: 111/71  Pulse: 89  Temp: 98.2 F (36.8 C)  Weight: 203 lb 12.8 oz (92.4 kg)    Fetal Status: Fetal Heart Rate (bpm): 154   Movement: Present     General:  Alert, oriented and cooperative. Patient is in no acute distress.  Skin: Skin is warm and dry. No rash noted.   Cardiovascular: Normal heart rate noted  Respiratory: Normal respiratory effort, no problems with respiration noted  Abdomen: Soft, gravid, appropriate for gestational age. Pain/Pressure: Absent     Pelvic:  Cervical exam deferred        Extremities: Normal range of motion.  Edema: None  Mental Status: Normal mood and affect. Normal behavior. Normal judgment and thought content.   Urinalysis:      Assessment and Plan:  Pregnancy: [id number] at 24109w4d  1. Supervision of high risk pregnancy in third trimester TDAP vaccine today Pt to obtain FMLA paperwork for job  2. History of pre-eclampsia Continue with BASA  3. Diet controlled gestational diabetes mellitus (GDM), antepartum BS in goal range  Preterm labor symptoms and general obstetric  precautions including but not limited to vaginal bleeding, contractions, leaking of fluid and fetal movement were reviewed in detail with the patient. Please refer to After Visit Summary for other counseling recommendations.  No Follow-up on file.   Hermina StaggersMichael L Kenzie Thoreson, MD

## 2016-10-07 NOTE — Addendum Note (Signed)
Addended by: Natale MilchSTALLING, Carlos Quackenbush D on: 10/07/2016 09:11 AM   Modules accepted: Orders

## 2016-10-07 NOTE — Progress Notes (Signed)
Patient states that she feels good today, reports good fetal movement. 

## 2016-10-14 DIAGNOSIS — Z3482 Encounter for supervision of other normal pregnancy, second trimester: Secondary | ICD-10-CM

## 2016-10-23 ENCOUNTER — Ambulatory Visit (INDEPENDENT_AMBULATORY_CARE_PROVIDER_SITE_OTHER): Payer: Medicaid Other | Admitting: Obstetrics

## 2016-10-23 ENCOUNTER — Encounter: Payer: Self-pay | Admitting: Obstetrics

## 2016-10-23 VITALS — BP 113/74 | HR 88 | Wt 206.0 lb

## 2016-10-23 DIAGNOSIS — O24410 Gestational diabetes mellitus in pregnancy, diet controlled: Secondary | ICD-10-CM

## 2016-10-23 DIAGNOSIS — O0990 Supervision of high risk pregnancy, unspecified, unspecified trimester: Secondary | ICD-10-CM

## 2016-10-23 NOTE — Progress Notes (Signed)
Subjective:    Marcia Bryant is a 30 y.o. female being seen today for her obstetrical visit. She is at 5760w6d gestation. Patient reports no complaints. Fetal movement: normal.  Problem List Items Addressed This Visit    Supervision of high-risk pregnancy - Primary   Relevant Orders   CBC   HIV antibody   RPR    Other Visit Diagnoses    Diet controlled White classification A1 gestational diabetes mellitus (GDM)       Relevant Orders   US OB Follow Up     Patient Active Problem List   Diagnosis Date Noted  . Gestational diabetes mellitus (GDM), antepartum 06/15/2016  . failed early 1h (154) 05/25/2016  . Supervision of high-risk pregnancy 05/20/2016  . BMI 33.0-33.9,adult 05/20/2016  . Obesity in pregnancy 05/20/2016  . History of pre-eclampsia 05/20/2016  . History of shoulder dystocia in prior pregnancy 05/20/2016  . Umbilical hernia 05/20/2016   Objective:    BP 113/74   Pulse 88   Wt 206 lb (93.4 kg)   LMP 03/30/2016   BMI 35.36 kg/m  FHT:  150 BPM  Uterine Size: size equals dates  Presentation: unsure     Assessment:    Pregnancy @ 4360w6d weeks   Plan:     labs reviewed, problem list updated Consent signed. GBS sent TDAP offered  Rhogam given for RH negative Pediatrician: discussed. Infant feeding: plans to breastfeed. Maternity leave: discussed. Cigarette smoking: never smoked. Orders Placed This Encounter  Procedures  . US OB Follow Up    Standing Status:   Future    Standing Expiration Date:   12/24/2017    Order Specific Question:   Reason for Exam (SYMPTOM  OR DIAGNOSIS REQUIRED)    Answer:   interval growth.  GDM - Diet Controlled.    Order Specific Question:   Preferred imaging location?    Answer:   [facility]tras[facility]  . CBC  . HIV antibody  . RPR   No orders of the defined types were placed in this encounter.  Follow up in 2 Weeks.

## 2016-10-23 NOTE — Addendum Note (Signed)
Addended by: Dalphine HandingGARDNER, TYVONA L on: 10/23/2016 10:49 AM   Modules accepted: Orders

## 2016-10-26 ENCOUNTER — Telehealth: Payer: Self-pay

## 2016-10-26 NOTE — Telephone Encounter (Signed)
Called and spoke with patient about paperwork.

## 2016-11-06 ENCOUNTER — Ambulatory Visit: Payer: Medicaid Other

## 2016-11-12 ENCOUNTER — Encounter: Payer: Self-pay | Admitting: Obstetrics

## 2016-11-12 ENCOUNTER — Ambulatory Visit: Payer: Medicaid Other

## 2016-11-12 ENCOUNTER — Ambulatory Visit (INDEPENDENT_AMBULATORY_CARE_PROVIDER_SITE_OTHER): Payer: Medicaid Other | Admitting: Obstetrics

## 2016-11-12 VITALS — BP 125/78 | HR 99 | Wt 206.0 lb

## 2016-11-12 DIAGNOSIS — Z3483 Encounter for supervision of other normal pregnancy, third trimester: Secondary | ICD-10-CM

## 2016-11-12 DIAGNOSIS — O0990 Supervision of high risk pregnancy, unspecified, unspecified trimester: Secondary | ICD-10-CM

## 2016-11-12 DIAGNOSIS — Z3480 Encounter for supervision of other normal pregnancy, unspecified trimester: Secondary | ICD-10-CM

## 2016-11-12 LAB — OB RESULTS CONSOLE GBS: STREP GROUP B AG: NEGATIVE

## 2016-11-12 NOTE — Progress Notes (Signed)
Subjective:    Marcia Bryant is a 31 y.o. female being seen today for her obstetrical visit. She is at [redacted]w[redacted]d gestation. Patient reports no complaints. Fetal movement: normal.  Problem List Items Addressed This Visit    Supervision of high-risk pregnancy - Primary   Relevant Orders   CBC   RPR   HIV antibody   Strep Gp B NAA     Patient Active Problem List   Diagnosis Date Noted  . Gestational diabetes mellitus (GDM), antepartum 06/15/2016  . failed early 1h (154) 05/25/2016  . Supervision of high-risk pregnancy 05/20/2016  . BMI 33.0-33.9,adult 05/20/2016  . Obesity in pregnancy 05/20/2016  . History of pre-eclampsia 05/20/2016  . History of shoulder dystocia in prior pregnancy 05/20/2016  . Umbilical hernia 05/20/2016   Objective:    BP 125/78   Pulse 99   Wt 206 lb (93.4 kg)   LMP 03/30/2016   BMI 35.36 kg/m  FHT:  150 BPM  Uterine Size: size equals dates  Presentation: unsure     Assessment:    Pregnancy @ 183w5d weeks   Plan:     labs reviewed, problem list updated Consent signed. GBS sent TDAP offered  Rhogam given for RH negative Pediatrician: discussed. Infant feeding: plans to breastfeed. Maternity leave: discussed. Cigarette smoking: never smoked. Orders Placed This Encounter  Procedures  . Strep Gp B NAA  . CBC  . RPR  . HIV antibody   No orders of the defined types were placed in this encounter.  Follow up in 1 Week.   Patient ID: Marcia Bryant, female   DOB: [birth year], 31 y.o.   MRN: [id number]

## 2016-11-12 NOTE — Progress Notes (Signed)
Pt has glucose readings. No concerns per pt.

## 2016-11-13 ENCOUNTER — Ambulatory Visit (INDEPENDENT_AMBULATORY_CARE_PROVIDER_SITE_OTHER): Payer: Medicaid Other

## 2016-11-13 DIAGNOSIS — O24410 Gestational diabetes mellitus in pregnancy, diet controlled: Secondary | ICD-10-CM

## 2016-11-13 LAB — HIV ANTIBODY (ROUTINE TESTING W REFLEX): HIV SCREEN 4TH GENERATION: NONREACTIVE

## 2016-11-13 LAB — RPR: RPR: NONREACTIVE

## 2016-11-14 LAB — STREP GP B NAA: STREP GROUP B AG: NEGATIVE

## 2016-11-17 LAB — SPECIMEN STATUS REPORT

## 2016-11-23 ENCOUNTER — Ambulatory Visit (INDEPENDENT_AMBULATORY_CARE_PROVIDER_SITE_OTHER): Payer: Medicaid Other | Admitting: Certified Nurse Midwife

## 2016-11-23 VITALS — BP 116/77 | HR 99 | Wt 210.8 lb

## 2016-11-23 DIAGNOSIS — O24410 Gestational diabetes mellitus in pregnancy, diet controlled: Secondary | ICD-10-CM

## 2016-11-23 DIAGNOSIS — Z8759 Personal history of other complications of pregnancy, childbirth and the puerperium: Secondary | ICD-10-CM

## 2016-11-23 DIAGNOSIS — O0993 Supervision of high risk pregnancy, unspecified, third trimester: Secondary | ICD-10-CM

## 2016-11-23 DIAGNOSIS — Z6833 Body mass index (BMI) 33.0-33.9, adult: Secondary | ICD-10-CM

## 2016-11-23 NOTE — Progress Notes (Signed)
Patient reports she is doing well- she states her glucose readings in am-70/80. After meals she states 90/112.

## 2016-11-23 NOTE — Progress Notes (Signed)
   PRENATAL VISIT NOTE  Subjective:  Marcia Bryant is a 31 y.o. [phone] at [redacted]w[redacted]d being seen today for ongoing prenatal care.  She is currently monitored for the following issues for this high-risk pregnancy and has Supervision of high-risk pregnancy; BMI 33.0-33.9,adult; Obesity in pregnancy; History of pre-eclampsia; History of shoulder dystocia in prior pregnancy; Umbilical hernia; failed early 1h (154); and Gestational diabetes mellitus (GDM), antepartum on her problem list.  Patient reports no complaints.  Contractions: Not present. Vag. Bleeding: None.  Movement: Present. Denies leaking of fluid.   The following portions of the patient's history were reviewed and updated as appropriate: allergies, current medications, past family history, past medical history, past social history, past surgical history and problem list. Problem list updated.  Objective:   Vitals:   11/23/16 0953  BP: 116/77  Pulse: 99  Weight: 210 lb 12.8 oz (95.6 kg)    Fetal Status:     Movement: Present     General:  Alert, oriented and cooperative. Patient is in no acute distress.  Skin: Skin is warm and dry. No rash noted.   Cardiovascular: Normal heart rate noted  Respiratory: Normal respiratory effort, no problems with respiration noted  Abdomen: Soft, gravid, appropriate for gestational age. Pain/Pressure: Absent     Pelvic:  Cervical exam deferred        Extremities: Normal range of motion.  Edema: None  Mental Status: Normal mood and affect. Normal behavior. Normal judgment and thought content.  .cwh Assessment and Plan:  Pregnancy: [id number] at [redacted]w[redacted]d  1. Diet controlled gestational diabetes mellitus (GDM) in third trimester    GDM in good control.  - Fetal nonstress test; scheduled  2. Supervision of high risk pregnancy in third trimester     Reactive NST. NST: + accels, no decels, moderate variability, Cat. 1 tracing. No contractions on toco.    3. History of shoulder dystocia in prior  pregnancy     EFW: 56% on 11/13/16 of 5#15oz  4. History of pre-eclampsia      Blood pressure WNL, was on baby ASA  5. BMI 33.0-33.9,adult             Term labor symptoms and general obstetric precautions including but not limited to vaginal bleeding, contractions, leaking of fluid and fetal movement were reviewed in detail with the patient. Please refer to After Visit Summary for other counseling recommendations.  Return in about 1 week (around 11/30/2016) for ROB, NST.   Roe Coombsachelle A Shandie Bertz, CNM

## 2016-11-30 ENCOUNTER — Inpatient Hospital Stay (HOSPITAL_COMMUNITY): Payer: Medicaid Other

## 2016-11-30 ENCOUNTER — Inpatient Hospital Stay (HOSPITAL_COMMUNITY)
Admission: AD | Admit: 2016-11-30 | Discharge: 2016-11-30 | Disposition: A | Discharge status: Home / Self Care | Payer: Medicaid Other | Source: Ambulatory Visit | Attending: Obstetrics & Gynecology | Admitting: Obstetrics & Gynecology

## 2016-11-30 ENCOUNTER — Encounter (HOSPITAL_COMMUNITY): Payer: Self-pay

## 2016-11-30 DIAGNOSIS — O368131 Decreased fetal movements, third trimester, fetus 1: Secondary | ICD-10-CM | POA: Diagnosis not present

## 2016-11-30 DIAGNOSIS — O24410 Gestational diabetes mellitus in pregnancy, diet controlled: Secondary | ICD-10-CM | POA: Insufficient documentation

## 2016-11-30 DIAGNOSIS — O368190 Decreased fetal movements, unspecified trimester, not applicable or unspecified: Secondary | ICD-10-CM

## 2016-11-30 DIAGNOSIS — O368130 Decreased fetal movements, third trimester, not applicable or unspecified: Secondary | ICD-10-CM | POA: Diagnosis not present

## 2016-11-30 DIAGNOSIS — Z3A38 38 weeks gestation of pregnancy: Secondary | ICD-10-CM | POA: Diagnosis not present

## 2016-11-30 DIAGNOSIS — O26893 Other specified pregnancy related conditions, third trimester: Secondary | ICD-10-CM

## 2016-11-30 DIAGNOSIS — N898 Other specified noninflammatory disorders of vagina: Secondary | ICD-10-CM | POA: Diagnosis not present

## 2016-11-30 HISTORY — DX: Gestational diabetes mellitus in pregnancy, unspecified control: O24.419

## 2016-11-30 HISTORY — DX: Type 2 diabetes mellitus without complications: E11.9

## 2016-11-30 LAB — AMNISURE RUPTURE OF MEMBRANE (ROM) NOT AT ARMC: AMNISURE: NEGATIVE

## 2016-11-30 NOTE — MAU Provider Note (Signed)
Obstetric Resident MAU Note  Chief Complaint:  Decreased Fetal Movement    HPI: Marcia Bryant is a 31 y.o. [phone] at 39w2dwho presents to maternity admissions reporting decreased fetal movement, as well as possible leakage of clear fluid with her shower yesterday. She denies any recurrent leakage of fluid since that time.   Reports having intermittent contractions. Denies any vaginal bleeding. Continues to feel as though there is slightly decreased fetal movement.   Pregnancy Course: [facility] G[facility]- diet controlled Has been followed with repeat ultrasounds for EFW, with most recent on 11/13/16 showing EFW at 56%tile  Patient Active Problem List   Diagnosis Date Noted  . Gestational diabetes mellitus (GDM), antepartum 06/15/2016  . failed early 1h (154) 05/25/2016  . Supervision of high-risk pregnancy 05/20/2016  . BMI 33.0-33.9,adult 05/20/2016  . Obesity in pregnancy 05/20/2016  . History of pre-eclampsia 05/20/2016  . History of shoulder dystocia in prior pregnancy 05/20/2016  . Umbilical hernia 034/28/7681   Past Medical History:  Diagnosis Date  . Bacterial vaginosis   . Diabetes mellitus without complication ([place]   . Gestational diabetes   . Preeclampsia 01/25/2013  . Umbilical hernia 71/57/2620 . Yeast infection     OB History  Gravida Para Term Preterm AB Living  _0 SAB TAB Ectopic Multiple Live Births  1       2    # Outcome Date GA Lbr Len/2nd Weight Sex Delivery Anes PTL Lv  4 Current           3 Term 01/26/13 316w4d0:04 / 00:34 8 lb 9.8 oz (3.907 kg) M Vag-Spont EPI  LIV  2 SAB 01/2012 4w59w0d         Birth Comments: Passed naturally;no complications  1 Term 05/35/59/74w35w0dlb (2.722 kg) M Vag-Spont EPI  LIV     Birth Comments: No complications      History reviewed. No pertinent surgical history.  Family History: Family History  Problem Relation Age of Onset  . Hypertension Father   . Hypertension  Paternal Grandmother     Social History: Social History  Substance Use Topics  . Smoking status: Never Smoker  . Smokeless tobacco: Never Used  . Alcohol use No    Allergies: No Known Allergies  No prescriptions prior to admission.    ROS: Pertinent findings in history of present illness.  Physical Exam  Blood pressure 117/71, pulse 86, temperature 98.1 F (36.7 C), temperature source Oral, resp. rate 18, last menstrual period 03/30/2016. CONSTITUTIONAL: Well-developed, well-nourished female in no acute distress.  HENT:  Normocephalic, atraumatic. Moist mucus membranes EYES: Conjunctivae and EOM are normal. Normal appearing sclerae.  NECK: Normal range of motion, supple SKIN: Skin is warm and dry. No rash noted. Not diaphoretic. No erythema. No pallor. NEUREffieert and oriented to person, place, and time. No focal defects PSYCHIATRIC: Normal mood and affect. Normal behavior. Normal judgment and thought content. CARDIOVASCULAR: Normal heart rate noted, regular rhythm RESPIRATORY:No increased work of breathing, stable on room air, LCTAB ABDOMEN: Soft, nontender, nondistended, gravid appropriate for gestational age MUSCULOSKELETAL: Normal range of motion. No edema and no tenderness. 2+ distal pulses.  SPECULUM EXAM: deferred Dilation: Fingertip Effacement (%): Thick Cervical Position: Posterior Exam by:: L. SMerlyn Albert  FHT:  Baseline 150s , moderate variability, accelerations present, no decelerations Contractions: no regular contractions, irregular pattern   Labs: Results  for orders placed or performed during the hospital encounter of 11/30/16 (from the past 24 hour(s))  Amnisure rupture of membrane (rom)not at [facility]     Status: None   Collection Time: 11/30/16  1:12 PM  Result Value Ref Range   Amnisure ROM NEGATIVE     Imaging:  No results found.  MAU Course: Patient initially presented with concern for decreased fetal movement and possible leakage of fluid  the day prior, with no recurrence. Limited OB ultrasound was obtained that showed a normal AFI. An amnisure was obtained and found to be negative. The patient was provided labor precautions and discharged home. Care was transitioned over to the Fellow, Dr. Vanetta Shawl. See addendum for any final changes in plan.   Assessment: 1. Decreased fetal movement   2. Decreased fetal movement affecting management of pregnancy in third trimester, fetus 1   3. Vaginal discharge during pregnancy in third trimester     Plan: Discharge home Labor precautions and fetal kick counts reviewed Follow up with OB provider  Follow-up [facility]. Go in 1 week(s).   Specialty:  Obstetrics and Gynecology Why:  Routine OB visit Contact information: [address] [place] [phone]          Allergies as of 11/30/2016   No Known Allergies     Medication List    STOP taking these medications   hydrocortisone 2.5 % rectal cream Commonly known as:  ANUSOL-HC     TAKE these medications   ACCU-CHEK FASTCLIX LANCETS Misc USE FOUR TIMES DAILY   ACCU-CHEK NANO SMARTVIEW w/Device Kit 1 Device by Does not apply route 4 (four) times daily.   aspirin EC 81 MG tablet Take 1 tablet (81 mg total) by mouth daily.   glucose blood test strip Commonly known as:  ACCU-CHEK SMARTVIEW Please use 4 times daily   prenatal multivitamin Tabs tablet Take 1 tablet by mouth daily at 12 noon.       Loann Quill, MD PGY-2 11/30/2016 6:25 PM   OB FELLOW MAU DISCHARGE ATTESTATION  I have seen and examined this patient; I agree with above documentation in the resident's note. After re-evaluation of the patient, she states she has been feeling baby move while being monitored. Patient is not ruptured, and is reassuring on NST, with normal AFI.  I personally reviewed the patient's NST today, found to be REACTIVE. 145 bpm, mod var,  +accels, no decels. CTX: Irregular   Katherine Basset, DO OB Fellow 9:41 PM

## 2016-11-30 NOTE — Discharge Instructions (Signed)
Introduction Patient Name: ________________________________________________ Patient Due Date: ____________________ What is a fetal movement count? A fetal movement count is the number of times that you feel your baby move during a certain amount of time. This may also be called a fetal kick count. A fetal movement count is recommended for every pregnant woman. You may be asked to start counting fetal movements as early as week 28 of your pregnancy. Pay attention to when your baby is most active. You may notice your baby's sleep and wake cycles. You may also notice things that make your baby move more. You should do a fetal movement count:  When your baby is normally most active.  At the same time each day. A good time to count movements is while you are resting, after having something to eat and drink. How do I count fetal movements? 1. Find a quiet, comfortable area. Sit, or lie down on your side. 2. Write down the date, the start time and stop time, and the number of movements that you felt between those two times. Take this information with you to your health care visits. 3. For 2 hours, count kicks, flutters, swishes, rolls, and jabs. You should feel at least 10 movements during 2 hours. 4. You may stop counting after you have felt 10 movements. 5. If you do not feel 10 movements in 2 hours, have something to eat and drink. Then, keep resting and counting for 1 hour. If you feel at least 4 movements during that hour, you may stop counting. Contact a health care provider if:  You feel fewer than 4 movements in 2 hours.  Your baby is not moving like he or she usually does. Date: ____________ Start time: ____________ Stop time: ____________ Movements: ____________ Date: ____________ Start time: ____________ Stop time: ____________ Movements: ____________ Date: ____________ Start time: ____________ Stop time: ____________ Movements: ____________ Date: ____________ Start time: ____________  Stop time: ____________ Movements: ____________ Date: ____________ Start time: ____________ Stop time: ____________ Movements: ____________ Date: ____________ Start time: ____________ Stop time: ____________ Movements: ____________ Date: ____________ Start time: ____________ Stop time: ____________ Movements: ____________ Date: ____________ Start time: ____________ Stop time: ____________ Movements: ____________ Date: ____________ Start time: ____________ Stop time: ____________ Movements: ____________ This information is not intended to replace advice given to you by your health care provider. Make sure you discuss any questions you have with your health care provider. Document Released: 11/25/2006 Document Revised: 06/24/2016 Document Reviewed: 12/05/2015 Elsevier Interactive Patient Education  2017 [employer]   Third Trimester of Pregnancy The third trimester is from week 29 through week 42, months 7 through 9. This trimester is when your unborn baby (fetus) is growing very fast. At the end of the ninth month, the unborn baby is about 20 inches in length. It weighs about 6-10 pounds. Follow these instructions at home:  Avoid all smoking, herbs, and alcohol. Avoid drugs not approved by your doctor.  Do not use any tobacco products, including cigarettes, chewing tobacco, and electronic cigarettes. If you need help quitting, ask your doctor. You may get counseling or other support to help you quit.  Only take medicine as told by your doctor. Some medicines are safe and some are not during pregnancy.  Exercise only as told by your doctor. Stop exercising if you start having cramps.  Eat regular, healthy meals.  Wear a good support bra if your breasts are tender.  Do not use hot tubs, steam rooms, or saunas.  Wear your seat belt when driving.  Avoid raw meat, uncooked cheese, and liter boxes and soil used by cats.  Take your prenatal vitamins.  Take 1500-2000 milligrams of  calcium daily starting at the 20th week of pregnancy until you deliver your baby.  Try taking medicine that helps you poop (stool softener) as needed, and if your doctor approves. Eat more fiber by eating fresh fruit, vegetables, and whole grains. Drink enough fluids to keep your pee (urine) clear or pale yellow.  Take warm water baths (sitz baths) to soothe pain or discomfort caused by hemorrhoids. Use hemorrhoid cream if your doctor approves.  If you have puffy, bulging veins (varicose veins), wear support hose. Raise (elevate) your feet for 15 minutes, 3-4 times a day. Limit salt in your diet.  Avoid heavy lifting, wear low heels, and sit up straight.  Rest with your legs raised if you have leg cramps or low back pain.  Visit your dentist if you have not gone during your pregnancy. Use a soft toothbrush to brush your teeth. Be gentle when you floss.  You can have sex (intercourse) unless your doctor tells you not to.  Do not travel far distances unless you must. Only do so with your doctor's approval.  Take prenatal classes.  Practice driving to the hospital.  Pack your hospital bag.  Prepare the baby's room.  Go to your doctor visits. Get help if:  You are not sure if you are in labor or if your water has broken.  You are dizzy.  You have mild cramps or pressure in your lower belly (abdominal).  You have a nagging pain in your belly area.  You continue to feel sick to your stomach (nauseous), throw up (vomit), or have watery poop (diarrhea).  You have bad smelling fluid coming from your vagina.  You have pain with peeing (urination). Get help right away if:  You have a fever.  You are leaking fluid from your vagina.  You are spotting or bleeding from your vagina.  You have severe belly cramping or pain.  You lose or gain weight rapidly.  You have trouble catching your breath and have chest pain.  You notice sudden or extreme puffiness (swelling) of your  face, hands, ankles, feet, or legs.  You have not felt the baby move in over an hour.  You have severe headaches that do not go away with medicine.  You have vision changes. This information is not intended to replace advice given to you by your health care provider. Make sure you discuss any questions you have with your health care provider. Document Released: 01/20/2010 Document Revised: 04/02/2016 Document Reviewed: 12/27/2012 Elsevier Interactive Patient Education  2017 [employer]

## 2016-11-30 NOTE — MAU Note (Signed)
Pt presents to MAU with decreased fetal movement. Reports last time she felt baby move was around 0600 this morning. Denies any bleeding. Reports while she was in the shower last night around 2100 she is unsure if her water broke. Denies any leaking since. Denies any ctx.

## 2016-12-02 ENCOUNTER — Encounter: Payer: Self-pay | Admitting: Obstetrics

## 2016-12-02 ENCOUNTER — Ambulatory Visit (INDEPENDENT_AMBULATORY_CARE_PROVIDER_SITE_OTHER): Payer: Medicaid Other | Admitting: Obstetrics

## 2016-12-02 VITALS — BP 118/77 | HR 94 | Wt 211.0 lb

## 2016-12-02 DIAGNOSIS — Z8759 Personal history of other complications of pregnancy, childbirth and the puerperium: Secondary | ICD-10-CM

## 2016-12-02 DIAGNOSIS — O24410 Gestational diabetes mellitus in pregnancy, diet controlled: Secondary | ICD-10-CM

## 2016-12-02 DIAGNOSIS — O0993 Supervision of high risk pregnancy, unspecified, third trimester: Secondary | ICD-10-CM

## 2016-12-02 NOTE — Progress Notes (Signed)
Subjective:    Marcia Bryant is a 31 y.o. female being seen today for her obstetrical visit. She is at 6215w4d gestation. Patient reports no complaints. Fetal movement: normal.  Problem List Items Addressed This Visit    None     Patient Active Problem List   Diagnosis Date Noted  . Gestational diabetes mellitus (GDM), antepartum 06/15/2016  . failed early 1h (154) 05/25/2016  . Supervision of high-risk pregnancy 05/20/2016  . BMI 33.0-33.9,adult 05/20/2016  . Obesity in pregnancy 05/20/2016  . History of pre-eclampsia 05/20/2016  . History of shoulder dystocia in prior pregnancy 05/20/2016  . Umbilical hernia 05/20/2016    Objective:    BP 118/77   Pulse 94   Wt 211 lb (95.7 kg)   LMP 03/30/2016   BMI 36.22 kg/m  FHT: 150 BPM  Uterine Size: size equals dates  Presentations: cephalic   NST:  Reactive  Assessment:    Pregnancy @ 3915w4d weeks   Plan:   Plans for delivery: Vaginal anticipated; labs reviewed; problem list updated Counseling: Consent signed. Infant feeding: plans to breastfeed. Cigarette smoking: never smoked. L&D discussion: symptoms of labor, discussed when to call, discussed what number to call, anesthetic/analgesic options reviewed and delivering clinician:  plans no preference. Postpartum supports and preparation: circumcision discussed and contraception plans discussed.  Follow up in 1 Week.  Patient ID: Marcia Bryant, female   DOB: 1986/04/04, 31 y.o.   MRN: [id number]

## 2016-12-02 NOTE — Progress Notes (Signed)
GDM pt for routine visit and NST today. Pt has CBG log today.

## 2016-12-06 ENCOUNTER — Encounter (HOSPITAL_COMMUNITY): Payer: Self-pay | Admitting: *Deleted

## 2016-12-06 ENCOUNTER — Inpatient Hospital Stay (HOSPITAL_COMMUNITY)
Admission: AD | Admit: 2016-12-06 | Discharge: 2016-12-06 | Disposition: A | Discharge status: Home / Self Care | Payer: Medicaid Other | Source: Ambulatory Visit | Attending: Obstetrics & Gynecology | Admitting: Obstetrics & Gynecology

## 2016-12-06 ENCOUNTER — Inpatient Hospital Stay (HOSPITAL_COMMUNITY): Payer: Medicaid Other

## 2016-12-06 DIAGNOSIS — O368130 Decreased fetal movements, third trimester, not applicable or unspecified: Secondary | ICD-10-CM

## 2016-12-06 DIAGNOSIS — O24419 Gestational diabetes mellitus in pregnancy, unspecified control: Secondary | ICD-10-CM | POA: Insufficient documentation

## 2016-12-06 DIAGNOSIS — Z3A39 39 weeks gestation of pregnancy: Secondary | ICD-10-CM | POA: Insufficient documentation

## 2016-12-06 LAB — GLUCOSE, CAPILLARY: Glucose-Capillary: 154 mg/dL — ABNORMAL HIGH (ref 65–99)

## 2016-12-06 NOTE — MAU Note (Signed)
Pt states she was diagnosed with gestational diabetes.  For a few days now her blood sugar has been high and she has been feeling the baby move less.  Pt states sometimes she also has to breathe hard.

## 2016-12-06 NOTE — MAU Provider Note (Signed)
History   [id number] @ 39.1 wks in with decreased fetal movement for several days. Denies ROM or vag bleeding.  CSN: [id number]  Arrival date & time 12/06/16  [id number]   First Provider Initiated Contact with Patient 12/06/16 [phone]      Chief Complaint  Patient presents with  . Decreased Fetal Movement  . Hyperglycemia    HPI  Past Medical History:  Diagnosis Date  . Bacterial vaginosis   . Diabetes mellitus without complication (HCC)   . Gestational diabetes   . Preeclampsia 01/25/2013  . Umbilical hernia 05/20/2016  . Yeast infection     History reviewed. No pertinent surgical history.  Family History  Problem Relation Age of Onset  . Hypertension Father   . Hypertension Paternal Grandmother     Social History  Substance Use Topics  . Smoking status: Never Smoker  . Smokeless tobacco: Never Used  . Alcohol use No    OB History    Gravida Para Term Preterm AB Living   4 2 2   1 2    SAB TAB Ectopic Multiple Live Births   1       2      Review of Systems  Constitutional: Negative.   HENT: Negative.   Eyes: Negative.   Respiratory: Negative.   Cardiovascular: Negative.   Gastrointestinal: Negative.   Endocrine: Negative.   Genitourinary: Negative.   Musculoskeletal: Negative.   Skin: Negative.     Allergies  Patient has no known allergies.  Home Medications    BP 118/65 (BP Location: Left Arm)   Pulse 93   Temp 97.8 F (36.6 C) (Oral)   Resp 16   LMP 03/30/2016   SpO2 98%   Physical Exam  Constitutional: She is oriented to person, place, and time. She appears well-developed and well-nourished.  HENT:  Head: Normocephalic.  Eyes: Pupils are equal, round, and reactive to light.  Neck: Normal range of motion.  Cardiovascular: Normal rate, regular rhythm, normal heart sounds and intact distal pulses.   Pulmonary/Chest: Effort normal and breath sounds normal.  Abdominal: Soft. Bowel sounds are normal.  Musculoskeletal: Normal range of motion.   Neurological: She is alert and oriented to person, place, and time. She has normal reflexes.  Skin: Skin is warm and dry.  Psychiatric: She has a normal mood and affect. Her behavior is normal. Judgment and thought content normal.    MAU Course  Procedures (including critical care time)  Labs Reviewed - No data to display No results found.   1. Decreased fetal movement affecting management of pregnancy in third trimester, single or unspecified fetus   2. Gestational diabetes mellitus (GDM) affecting fourth pregnancy       MDM  Good fetal movement noted since admission to unit. Fetus hard to trace 2/2 fetal activity. FHR pattern reassuring. BPP8/8 will d/c home

## 2016-12-06 NOTE — Discharge Instructions (Signed)
Introduction Patient Name: ________________________________________________ Patient Due Date: ____________________ What is a fetal movement count? A fetal movement count is the number of times that you feel your baby move during a certain amount of time. This may also be called a fetal kick count. A fetal movement count is recommended for every pregnant woman. You may be asked to start counting fetal movements as early as week 28 of your pregnancy. Pay attention to when your baby is most active. You may notice your baby's sleep and wake cycles. You may also notice things that make your baby move more. You should do a fetal movement count:  When your baby is normally most active.  At the same time each day. A good time to count movements is while you are resting, after having something to eat and drink. How do I count fetal movements? 1. Find a quiet, comfortable area. Sit, or lie down on your side. 2. Write down the date, the start time and stop time, and the number of movements that you felt between those two times. Take this information with you to your health care visits. 3. For 2 hours, count kicks, flutters, swishes, rolls, and jabs. You should feel at least 10 movements during 2 hours. 4. You may stop counting after you have felt 10 movements. 5. If you do not feel 10 movements in 2 hours, have something to eat and drink. Then, keep resting and counting for 1 hour. If you feel at least 4 movements during that hour, you may stop counting. Contact a health care provider if:  You feel fewer than 4 movements in 2 hours.  Your baby is not moving like he or she usually does. Date: ____________ Start time: ____________ Stop time: ____________ Movements: ____________ Date: ____________ Start time: ____________ Stop time: ____________ Movements: ____________ Date: ____________ Start time: ____________ Stop time: ____________ Movements: ____________ Date: ____________ Start time: ____________  Stop time: ____________ Movements: ____________ Date: ____________ Start time: ____________ Stop time: ____________ Movements: ____________ Date: ____________ Start time: ____________ Stop time: ____________ Movements: ____________ Date: ____________ Start time: ____________ Stop time: ____________ Movements: ____________ Date: ____________ Start time: ____________ Stop time: ____________ Movements: ____________ Date: ____________ Start time: ____________ Stop time: ____________ Movements: ____________ This information is not intended to replace advice given to you by your health care provider. Make sure you discuss any questions you have with your health care provider. Document Released: 11/25/2006 Document Revised: 06/24/2016 Document Reviewed: 12/05/2015 Elsevier Interactive Patient Education  2017 [employer]  

## 2016-12-10 ENCOUNTER — Telehealth (HOSPITAL_COMMUNITY): Payer: Self-pay | Admitting: *Deleted

## 2016-12-10 ENCOUNTER — Ambulatory Visit (INDEPENDENT_AMBULATORY_CARE_PROVIDER_SITE_OTHER): Payer: Medicaid Other | Admitting: Obstetrics

## 2016-12-10 ENCOUNTER — Inpatient Hospital Stay (HOSPITAL_COMMUNITY)
Admission: AD | Admit: 2016-12-10 | Discharge: 2016-12-10 | Disposition: A | Discharge status: Home / Self Care | Payer: Medicaid Other | Source: Ambulatory Visit | Attending: Obstetrics and Gynecology | Admitting: Obstetrics and Gynecology

## 2016-12-10 ENCOUNTER — Encounter (HOSPITAL_COMMUNITY): Payer: Self-pay | Admitting: *Deleted

## 2016-12-10 VITALS — BP 123/80 | HR 98 | Wt 214.2 lb

## 2016-12-10 DIAGNOSIS — O99810 Abnormal glucose complicating pregnancy: Secondary | ICD-10-CM

## 2016-12-10 DIAGNOSIS — O9989 Other specified diseases and conditions complicating pregnancy, childbirth and the puerperium: Secondary | ICD-10-CM

## 2016-12-10 DIAGNOSIS — O368130 Decreased fetal movements, third trimester, not applicable or unspecified: Secondary | ICD-10-CM | POA: Insufficient documentation

## 2016-12-10 DIAGNOSIS — O24410 Gestational diabetes mellitus in pregnancy, diet controlled: Secondary | ICD-10-CM | POA: Diagnosis not present

## 2016-12-10 DIAGNOSIS — Z3A39 39 weeks gestation of pregnancy: Secondary | ICD-10-CM | POA: Insufficient documentation

## 2016-12-10 DIAGNOSIS — O288 Other abnormal findings on antenatal screening of mother: Secondary | ICD-10-CM

## 2016-12-10 DIAGNOSIS — O0990 Supervision of high risk pregnancy, unspecified, unspecified trimester: Secondary | ICD-10-CM

## 2016-12-10 DIAGNOSIS — Z7689 Persons encountering health services in other specified circumstances: Secondary | ICD-10-CM | POA: Diagnosis present

## 2016-12-10 NOTE — Telephone Encounter (Signed)
Preadmission screen  

## 2016-12-10 NOTE — Progress Notes (Signed)
Subjective:  Marcia Bryant is a 31 y.o. [id number] at 2625w5d being seen today for ongoing prenatal care.  She is currently monitored for the following issues for this high-risk pregnancy and has Supervision of high-risk pregnancy; BMI 33.0-33.9,adult; Obesity in pregnancy; History of pre-eclampsia; History of shoulder dystocia in prior pregnancy; Umbilical hernia; failed early 1h (154); and Gestational diabetes mellitus (GDM), antepartum on her problem list.  Patient reports occasional contractions.  Contractions: Irregular. Vag. Bleeding: None.  Movement: Present. Denies leaking of fluid.   The following portions of the patient's history were reviewed and updated as appropriate: allergies, current medications, past family history, past medical history, past social history, past surgical history and problem list. Problem list updated.  Objective:   Vitals:   12/10/16 0953  BP: 123/80  Pulse: 98  Weight: 214 lb 3.2 oz (97.2 kg)    Fetal Status: Fetal Heart Rate (bpm): 150   Movement: Present     General:  Alert, oriented and cooperative. Patient is in no acute distress.  Skin: Skin is warm and dry. No rash noted.   Cardiovascular: Normal heart rate noted  Respiratory: Normal respiratory effort, no problems with respiration noted  Abdomen: Soft, gravid, appropriate for gestational age. Pain/Pressure: Present     Pelvic:  Cervical exam deferred        Extremities: Normal range of motion.  Edema: None  Mental Status: Normal mood and affect. Normal behavior. Normal judgment and thought content.   Urinalysis: Urine Protein: Trace Urine Glucose: 3+  Assessment and Plan:  Pregnancy: [id number] at 9725w5d  1. Diet controlled gestational diabetes mellitus (GDM) in third trimester NST: - Fetal nonstress test; Non Reactive -[facility] scheduled at [redacted] weeks gestation  Term labor symptoms and general obstetric precautions including but not limited to vaginal bleeding,  contractions, leaking of fluid and fetal movement were reviewed in detail with the patient. Please refer to After Visit Summary for other counseling recommendations.     Brock Badharles A Harper, MDPatient ID: Marcia Bryant, female   DOB: [birth year], 31 y.o.   MRN: [id number]

## 2016-12-10 NOTE — Progress Notes (Signed)
Patient is in office for visit, NST, and induction date.

## 2016-12-10 NOTE — MAU Provider Note (Signed)
Chief Complaint:  non reactive NST   None     HPI: Marcia Bryant is a 31 y.o. [phone] at [redacted]w[redacted]d who presents to maternity admissions after being seen in the office and had a non-reactive NST.  Upon arriving to MAU she is feeling baby move and is reactive.  She does report infrequent contractions about a few per hour. She denies leakage of fluid or vaginal bleeding. Good fetal movement.   Pregnancy Course:   Past Medical History: Past Medical History:  Diagnosis Date  . Bacterial vaginosis   . Diabetes mellitus without complication (HCC)   . Gestational diabetes   . Preeclampsia 01/25/2013  . Umbilical hernia 05/20/2016  . Yeast infection     Past obstetric history: OB History  Gravida Para Term Preterm AB Living  4 2 2   1 2   SAB TAB Ectopic Multiple Live Births  1       2    # Outcome Date GA Lbr Len/2nd Weight Sex Delivery Anes PTL Lv  4 Current           3 Term 01/26/13 [redacted]w[redacted]d 10:04 / 00:34 3.907 kg (8 lb 9.8 oz) M Vag-Spont EPI  LIV  2 SAB 01/2012 [redacted]w[redacted]d            Birth Comments: Passed naturally;no complications  1 Term 03/21/11 [redacted]w[redacted]d  2.722 kg (6 lb) M Vag-Spont EPI  LIV     Birth Comments: No complications      Past Surgical History: History reviewed. No pertinent surgical history.   Family History: Family History  Problem Relation Age of Onset  . Hypertension Father   . Hypertension Paternal Grandmother     Social History: Social History  Substance Use Topics  . Smoking status: Never Smoker  . Smokeless tobacco: Never Used  . Alcohol use No    Allergies: No Known Allergies  Meds:  No prescriptions prior to admission.    I have reviewed patient's Past Medical Hx, Surgical Hx, Family Hx, Social Hx, medications and allergies.   ROS:  A comprehensive ROS was negative except per HPI.    Physical Exam   Patient Vitals for the past 24 hrs:  BP Temp Pulse Resp  12/10/16 1217 129/80 - 85 16  12/10/16 1133 125/75 98.6 F (37 C) 85 -    Constitutional: Well-developed, well-nourished female in no acute distress.  Cardiovascular: RRR, no MRG Respiratory: NWOB, CTABL, no wheezing or rhonchi GI: Abd soft, non-tender, gravid appropriate for gestational age. MS: Extremities nontender, no edema, normal ROM Neurologic: AAOx3 GU: Neg CVAT.  Pelvic: deferred      FHT:  Baseline HR 145, moderate variability, accelerations present, no decelerations Contractions: one contraction Labs: No results found for this or any previous visit (from the past 24 hour(s)).  Imaging:  [place] Mfm Fetal Bpp Wo Non Stress  Result Date: 12/07/2016 ----------------------------------------------------------------------  OBSTETRICS REPORT                      (Signed Final 12/07/2016 10:03 am) ---------------------------------------------------------------------- Patient Info  ID #:       [id number]                          D.O.B.:  05/12/1986 (30 yrs)  Name:       Marcia OSEI [facility]            Visit Date: 12/06/2016 10:22 am ---------------------------------------------------------------------- Performed By  Performed By:  Lora Mae Murrow        Ref. Address:      [address]                    [facility]                                                              [place]                                                              [id number]  Attending:        Durwin NoraJeffrey M Denney       Secondary Phy.:    MAU Nursing-                    MD                                                              MAU/Triage  Referred By:      Montez MoritaMARIE D LAWSON         [facility]                    [facility] ---------------------------------------------------------------------- Orders   #  Description                                 Code   1  US MFM FETAL BPP WO NON STRESS              76819.01  ----------------------------------------------------------------------   #  Ordered By               Order #        Accession #    Episode #   1  Wyvonnia DuskyMARIE LAWSON              [id number]      [id number]     [id number]  ---------------------------------------------------------------------- Indications   [redacted] weeks gestation of pregnancy                Z3A.39   Decreased fetal movement                       O36.8190   Gestational diabetes in pregnancy, diet        O24.410   controlled  ---------------------------------------------------------------------- OB History  Gravidity:    4         Term:   2        Prem:   0        SAB:   1  TOP:          0       Ectopic:  0  Living: 2 ---------------------------------------------------------------------- Fetal Evaluation  Num Of Fetuses:     1  Fetal Heart         146  Rate(bpm):  Cardiac Activity:   Observed  Presentation:       Cephalic  Placenta:           Anterior, above cervical os  Amniotic Fluid  AFI FV:      Subjectively within normal limits  AFI Sum(cm)     %Tile       Largest Pocket(cm)  11.29           39          4.74  RUQ(cm)       RLQ(cm)       LUQ(cm)        LLQ(cm)  0.95          2.1           3.5            4.74 ---------------------------------------------------------------------- Biophysical Evaluation  Amniotic F.V:   Pocket => 2 cm two         F. Tone:         Observed                  planes  F. Movement:    Observed                   Score:           8/8  F. Breathing:   Observed ---------------------------------------------------------------------- Gestational Age  LMP:           35w 6d        Date:  03/30/16                 EDD:   01/04/17  Clinical EDD:  39w 1d                                        EDD:   12/12/16  Best:          39w 1d     Det. By:  Clinical EDD             EDD:   12/12/16 ---------------------------------------------------------------------- Impression  SIUP at [redacted]w[redacted]d (remote read of ultrasound only)  active fetus  BPP 8/8 ---------------------------------------------------------------------- Recommendations  Recommend correlation with fetal heart tracing.  Management as per [facility] provider and as  clinically indicated. ----------------------------------------------------------------------               Durwin Nora, MD Electronically Signed Final Report   12/07/2016 10:03 am ----------------------------------------------------------------------  [place] Mfm Ob Limited  Result Date: 11/30/2016 ----------------------------------------------------------------------  OBSTETRICS REPORT                      (Signed Final 11/30/2016 09:48 pm) ---------------------------------------------------------------------- Patient Info  ID #:       [id number]                         D.O.B.:   1986/06/01 (30 yrs)  Name:       Marcia OSEI [facility]           Visit Date:  11/30/2016 11:52 am ---------------------------------------------------------------------- Performed By  Performed By:     Vivien Rota        Ref. Address:  [address]                                                             [place], [place]                                                             [address]  Attending:        Particia Nearing MD       Secondary Phy.:   MAU Nursing-                                                             MAU/Triage  Referred By:      Billey Gosling                Location:         [facility] MD ---------------------------------------------------------------------- Orders   #  Description                                 Code   1  [place] MFM OB LIMITED                           [phone]  ----------------------------------------------------------------------   #  Ordered By               Order #        Accession #    Episode #   1  Scheryl Darter             [id number]      [id number]     [id number]  ---------------------------------------------------------------------- Indications   [redacted] weeks gestation of pregnancy                Z3A.37   Decreased fetal movement                       O36.8190   Premature rupture  of membranes suspected       O42.90   - leaking fluid  ---------------------------------------------------------------------- OB History  Gravidity:    4         Term:   2        Prem:   0  SAB:   1  TOP:          0       Ectopic:  0        Living: 2 ---------------------------------------------------------------------- Fetal Evaluation  Num Of Fetuses:     1  Fetal Heart         159  Rate(bpm):  Cardiac Activity:   Observed  Presentation:       Cephalic  Placenta:           Anterior, above cervical os  Amniotic Fluid  AFI FV:      Subjectively within normal limits  AFI Sum(cm)     %Tile       Largest Pocket(cm)  9.69            22          4.47  RUQ(cm)                     LUQ(cm)        LLQ(cm)  2.18                        4.47           3.04 ---------------------------------------------------------------------- Gestational Age  LMP:           35w 0d       Date:   03/30/16                 EDD:   01/04/17  Best:          37w 3d    Det. By:   Marcella Dubs         EDD:   12/18/16                                      (05/20/16) ---------------------------------------------------------------------- Anatomy  Stomach:               Appears normal, left   Bladder:                Appears normal                         sided  Kidneys:               Appear normal ---------------------------------------------------------------------- Cervix Uterus Adnexa  Cervix  Not visualized (advanced GA >29wks)  Uterus  No abnormality visualized.  Left Ovary  Not visualized.  Right Ovary  Not visualized.  Adnexa:       No abnormality visualized. No adnexal mass                visualized. ---------------------------------------------------------------------- Impression  SIUP at 37+3 weeks  Cephalic presentation  Normal amniotic fluid volume ---------------------------------------------------------------------- Recommendations  Follow-up as clinically indicated ----------------------------------------------------------------------                  Particia Nearing, MD Electronically Signed Final Report   11/30/2016 09:48 pm ----------------------------------------------------------------------   MAU Course: Fetal monitoring: revealed baseline HR 145, moderate variability, accelerations present, no decelerations   MDM: Plan of care reviewed with patient, including labs and tests ordered and medical treatment.   Assessment: 1. [redacted] weeks gestation of pregnancy   2. Non-reactive NST Presented from the office for non-reactive NST and had a reactive strip here at MAU.  Denied loss of fluids, vaginal bleeding and  reports infrequent contractions.   Plan: Discharge home in stable condition. Discussed preterm labor precautions and fetal kick counts    Allergies as of 12/10/2016   No Known Allergies     Medication List    TAKE these medications   aspirin EC 81 MG tablet Take 1 tablet (81 mg total) by mouth daily.   prenatal multivitamin Tabs tablet Take 1 tablet by mouth daily.       Renne Musca, MD PGY-1 12/10/2016 12:26 PM  OB FELLOW DISCHARGE ATTESTATION  I have seen and examined this patient and agree with above documentation in the resident's note.   Ernestina Penna, MD 12:29 PM

## 2016-12-10 NOTE — Discharge Instructions (Signed)
**Note De-Identified [place] Obfuscation** Labor Induction Labor induction is when steps are taken to cause a pregnant woman to begin the labor process. Most women go into labor on their own between 37 weeks and 42 weeks of the pregnancy. When this does not happen or when there is a medical need, methods may be used to induce labor. Labor induction causes a pregnant woman's uterus to contract. It also causes the cervix to soften (ripen), open (dilate), and thin out (efface). Usually, labor is not induced before 39 weeks of the pregnancy unless there is a problem with the baby or mother. Before inducing labor, your health care provider will consider a number of factors, including the following:  The medical condition of you and the baby.  How many weeks along you are.  The status of the babys lung maturity.  The condition of the cervix.  The position of the baby. What are the reasons for labor induction? Labor may be induced for the following reasons:  The health of the baby or mother is at risk.  The pregnancy is overdue by 1 week or more.  The water breaks but labor does not start on its own.  The mother has a health condition or serious illness, such as high blood pressure, infection, placental abruption, or diabetes.  The amniotic fluid amounts are low around the baby.  The baby is distressed. Convenience or wanting the baby to be born on a certain date is not a reason for inducing labor. What methods are used for labor induction? Several methods of labor induction may be used, such as:  Prostaglandin medicine. This medicine causes the cervix to dilate and ripen. The medicine will also start contractions. It can be taken by mouth or by inserting a suppository into the vagina.  Inserting a thin tube (catheter) with a balloon on the end into the vagina to dilate the cervix. Once inserted, the balloon is expanded with water, which causes the cervix to open.  Stripping the membranes. Your health care provider separates  amniotic sac tissue from the cervix, causing the cervix to be stretched and causing the release of a hormone called progesterone. This may cause the uterus to contract. It is often done during an office visit. You will be sent home to wait for the contractions to begin. You will then come in for an induction.  Breaking the water. Your health care provider makes a hole in the amniotic sac using a small instrument. Once the amniotic sac breaks, contractions should begin. This may still take hours to see an effect.  Medicine to trigger or strengthen contractions. This medicine is given through an IV access tube inserted into a vein in your arm. All of the methods of induction, besides stripping the membranes, will be done in the hospital. Induction is done in the hospital so that you and the baby can be carefully monitored. How long does it take for labor to be induced? Some inductions can take up to 2-3 days. Depending on the cervix, it usually takes less time. It takes longer when you are induced early in the pregnancy or if this is your first pregnancy. If a mother is still pregnant and the induction has been going on for 2-3 days, either the mother will be sent home or a cesarean delivery will be needed. What are the risks associated with labor induction? Some of the risks of induction include:  Changes in fetal heart rate, such as too high, too low, or erratic.  Fetal distress.  Chance of infection for the mother and baby.  Increased chance of having a cesarean delivery.  Breaking off (abruption) of the placenta from the uterus (rare).  Uterine rupture (very rare). When induction is needed for medical reasons, the benefits of induction may outweigh the risks. What are some reasons for not inducing labor? Labor induction should not be done if:  It is shown that your baby does not tolerate labor.  You have had previous surgeries on your uterus, such as a myomectomy or the removal of  fibroids.  Your placenta lies very low in the uterus and blocks the opening of the cervix (placenta previa).  Your baby is not in a head-down position.  The umbilical cord drops down into the birth canal in front of the baby. This could cut off the baby's blood and oxygen supply.  You have had a previous cesarean delivery.  There are unusual circumstances, such as the baby being extremely premature. This information is not intended to replace advice given to you by your health care provider. Make sure you discuss any questions you have with your health care provider. Document Released: 03/17/2007 Document Revised: 04/02/2016 Document Reviewed: 05/25/2013 Elsevier Interactive Patient Education  2017 [employer] Inc[employer] Introduction Patient Name: ________________________________________________ Patient Due Date: ____________________ What is a fetal movement count? A fetal movement count is the number of times that you feel your baby move during a certain amount of time. This may also be called a fetal kick count. A fetal movement count is recommended for every pregnant woman. You may be asked to start counting fetal movements as early as week 28 of your pregnancy. Pay attention to when your baby is most active. You may notice your baby's sleep and wake cycles. You may also notice things that make your baby move more. You should do a fetal movement count:  When your baby is normally most active.  At the same time each day. A good time to count movements is while you are resting, after having something to eat and drink. How do I count fetal movements? 1. Find a quiet, comfortable area. Sit, or lie down on your side. 2. Write down the date, the start time and stop time, and the number of movements that you felt between those two times. Take this information with you to your health care visits. 3. For 2 hours, count kicks, flutters, swishes, rolls, and jabs. You should feel at least 10 movements  during 2 hours. 4. You may stop counting after you have felt 10 movements. 5. If you do not feel 10 movements in 2 hours, have something to eat and drink. Then, keep resting and counting for 1 hour. If you feel at least 4 movements during that hour, you may stop counting. Contact a health care provider if:  You feel fewer than 4 movements in 2 hours.  Your baby is not moving like he or she usually does. Date: ____________ Start time: ____________ Stop time: ____________ Movements: ____________ Date: ____________ Start time: ____________ Stop time: ____________ Movements: ____________ Date: ____________ Start time: ____________ Stop time: ____________ Movements: ____________ Date: ____________ Start time: ____________ Stop time: ____________ Movements: ____________ Date: ____________ Start time: ____________ Stop time: ____________ Movements: ____________ Date: ____________ Start time: ____________ Stop time: ____________ Movements: ____________ Date: ____________ Start time: ____________ Stop time: ____________ Movements: ____________ Date: ____________ Start time: ____________ Stop time: ____________ Movements: ____________ Date: ____________ Start time: ____________ Stop time: ____________ Movements: ____________ This information is not intended to replace advice  given to you by your health care provider. Make sure you discuss any questions you have with your health care provider. Document Released: 11/25/2006 Document Revised: 06/24/2016 Document Reviewed: 12/05/2015 Elsevier Interactive Patient Education  2017 [employer]

## 2016-12-10 NOTE — MAU Note (Signed)
Pt presents to MAU from Dr Verdell CarmineHarper's office for non reactive NST. Denies any VB or LOF. Reports baby is active

## 2016-12-12 ENCOUNTER — Encounter (HOSPITAL_COMMUNITY): Payer: Self-pay

## 2016-12-12 ENCOUNTER — Inpatient Hospital Stay (HOSPITAL_COMMUNITY)
Admission: RE | Admit: 2016-12-12 | Discharge: 2016-12-15 | DRG: 766 | Disposition: A | Discharge status: Home / Self Care | Payer: Medicaid Other | Source: Ambulatory Visit | Attending: Obstetrics & Gynecology | Admitting: Obstetrics & Gynecology

## 2016-12-12 ENCOUNTER — Inpatient Hospital Stay (HOSPITAL_COMMUNITY): Payer: Medicaid Other | Admitting: Anesthesiology

## 2016-12-12 VITALS — BP 130/67 | HR 80 | Temp 98.4°F | Resp 18 | Ht 64.0 in | Wt 215.0 lb

## 2016-12-12 DIAGNOSIS — O99214 Obesity complicating childbirth: Secondary | ICD-10-CM | POA: Diagnosis present

## 2016-12-12 DIAGNOSIS — Z6837 Body mass index (BMI) 37.0-37.9, adult: Secondary | ICD-10-CM

## 2016-12-12 DIAGNOSIS — Z3A40 40 weeks gestation of pregnancy: Secondary | ICD-10-CM

## 2016-12-12 DIAGNOSIS — O24420 Gestational diabetes mellitus in childbirth, diet controlled: Principal | ICD-10-CM | POA: Diagnosis present

## 2016-12-12 DIAGNOSIS — E669 Obesity, unspecified: Secondary | ICD-10-CM | POA: Diagnosis present

## 2016-12-12 DIAGNOSIS — O24410 Gestational diabetes mellitus in pregnancy, diet controlled: Secondary | ICD-10-CM | POA: Diagnosis present

## 2016-12-12 DIAGNOSIS — O324XX0 Maternal care for high head at term, not applicable or unspecified: Secondary | ICD-10-CM | POA: Diagnosis present

## 2016-12-12 DIAGNOSIS — Z98891 History of uterine scar from previous surgery: Secondary | ICD-10-CM

## 2016-12-12 DIAGNOSIS — O24429 Gestational diabetes mellitus in childbirth, unspecified control: Secondary | ICD-10-CM | POA: Diagnosis not present

## 2016-12-12 DIAGNOSIS — Z8249 Family history of ischemic heart disease and other diseases of the circulatory system: Secondary | ICD-10-CM | POA: Diagnosis not present

## 2016-12-12 LAB — CBC
HEMATOCRIT: 36.6 % (ref 36.0–46.0)
HEMOGLOBIN: 12.9 g/dL (ref 12.0–15.0)
MCH: 28.3 pg (ref 26.0–34.0)
MCHC: 35.2 g/dL (ref 30.0–36.0)
MCV: 80.3 fL (ref 78.0–100.0)
Platelets: 222 10*3/uL (ref 150–400)
RBC: 4.56 MIL/uL (ref 3.87–5.11)
RDW: 14.2 % (ref 11.5–15.5)
WBC: 10.8 10*3/uL — ABNORMAL HIGH (ref 4.0–10.5)

## 2016-12-12 LAB — GLUCOSE, CAPILLARY
GLUCOSE-CAPILLARY: 77 mg/dL (ref 65–99)
GLUCOSE-CAPILLARY: 163 mg/dL — AB (ref 65–99)
Glucose-Capillary: 106 mg/dL — ABNORMAL HIGH (ref 65–99)
Glucose-Capillary: 160 mg/dL — ABNORMAL HIGH (ref 65–99)
Glucose-Capillary: 45 mg/dL — ABNORMAL LOW (ref 65–99)
Glucose-Capillary: 76 mg/dL (ref 65–99)

## 2016-12-12 LAB — TYPE AND SCREEN
ABO/RH(D): O POS
Antibody Screen: NEGATIVE

## 2016-12-12 LAB — GLUCOSE, RANDOM: GLUCOSE: 217 mg/dL — AB (ref 65–99)

## 2016-12-12 MED ORDER — PHENYLEPHRINE 40 MCG/ML (10ML) SYRINGE FOR IV PUSH (FOR BLOOD PRESSURE SUPPORT)
80.0000 ug | PREFILLED_SYRINGE | INTRAVENOUS | Status: DC | PRN
  Filled 2016-12-12: qty 10

## 2016-12-12 MED ORDER — LACTATED RINGERS IV SOLN: 500.0000 mL | Freq: Once | INTRAVENOUS | Status: DC

## 2016-12-12 MED ORDER — DIPHENHYDRAMINE HCL 50 MG/ML IJ SOLN: 12.5000 mg | INTRAMUSCULAR | Status: DC | PRN

## 2016-12-12 MED ORDER — TERBUTALINE SULFATE 1 MG/ML IJ SOLN: 0.2500 mg | Freq: Once | INTRAMUSCULAR | Status: DC | PRN

## 2016-12-12 MED ORDER — EPHEDRINE 5 MG/ML INJ: 10.0000 mg | INTRAVENOUS | Status: DC | PRN

## 2016-12-12 MED ORDER — OXYCODONE-ACETAMINOPHEN 5-325 MG PO TABS: 1.0000 | ORAL_TABLET | ORAL | Status: DC | PRN

## 2016-12-12 MED ORDER — FLEET ENEMA 7-19 GM/118ML RE ENEM: 1.0000 | ENEMA | RECTAL | Status: DC | PRN

## 2016-12-12 MED ORDER — LIDOCAINE HCL (PF) 1 % IJ SOLN
30.0000 mL | INTRAMUSCULAR | Status: DC | PRN
  Filled 2016-12-12: qty 30

## 2016-12-12 MED ORDER — SOD CITRATE-CITRIC ACID 500-334 MG/5ML PO SOLN
30.0000 mL | ORAL | Status: DC | PRN
  Administered 2016-12-13: 30 mL via ORAL
  Filled 2016-12-12: qty 15

## 2016-12-12 MED ORDER — LIDOCAINE HCL (PF) 1 % IJ SOLN
INTRAMUSCULAR | Status: DC | PRN
  Administered 2016-12-12: 2 mL via EPIDURAL
  Administered 2016-12-12: 5 mL via EPIDURAL
  Administered 2016-12-12: 3 mL via EPIDURAL

## 2016-12-12 MED ORDER — LACTATED RINGERS IV SOLN
INTRAVENOUS | Status: DC
  Administered 2016-12-12: 23:00:00 via INTRAVENOUS
  Administered 2016-12-12: 1000 mL via INTRAVENOUS

## 2016-12-12 MED ORDER — DEXTROSE 50 % IV SOLN
INTRAVENOUS | Status: AC
  Filled 2016-12-12: qty 50

## 2016-12-12 MED ORDER — PHENYLEPHRINE 40 MCG/ML (10ML) SYRINGE FOR IV PUSH (FOR BLOOD PRESSURE SUPPORT): 80.0000 ug | PREFILLED_SYRINGE | INTRAVENOUS | Status: DC | PRN

## 2016-12-12 MED ORDER — DEXTROSE IN LACTATED RINGERS 5 % IV SOLN: INTRAVENOUS | Status: DC

## 2016-12-12 MED ORDER — FENTANYL CITRATE (PF) 100 MCG/2ML IJ SOLN
100.0000 ug | INTRAMUSCULAR | Status: DC | PRN
  Administered 2016-12-12: 100 ug via INTRAVENOUS
  Filled 2016-12-12: qty 2

## 2016-12-12 MED ORDER — SODIUM CHLORIDE 0.9 % IV SOLN
INTRAVENOUS | Status: DC
  Filled 2016-12-12: qty 2.5

## 2016-12-12 MED ORDER — OXYTOCIN BOLUS FROM INFUSION: 500.0000 mL | Freq: Once | INTRAVENOUS | Status: DC

## 2016-12-12 MED ORDER — OXYTOCIN 40 UNITS IN LACTATED RINGERS INFUSION - SIMPLE MED: 2.5000 [IU]/h | INTRAVENOUS | Status: DC

## 2016-12-12 MED ORDER — FENTANYL 2.5 MCG/ML BUPIVACAINE 1/10 % EPIDURAL INFUSION (WH - ANES)
14.0000 mL/h | INTRAMUSCULAR | Status: DC | PRN
  Administered 2016-12-12: 14 mL/h via EPIDURAL
  Administered 2016-12-12: 12 mL/h via EPIDURAL
  Administered 2016-12-13: 14 mL/h via EPIDURAL
  Filled 2016-12-12 (×3): qty 100

## 2016-12-12 MED ORDER — LACTATED RINGERS IV SOLN
500.0000 mL | Freq: Once | INTRAVENOUS | Status: AC
  Administered 2016-12-12: 500 mL via INTRAVENOUS

## 2016-12-12 MED ORDER — LACTATED RINGERS IV SOLN
500.0000 mL | INTRAVENOUS | Status: DC | PRN
  Administered 2016-12-12 – 2016-12-13 (×2): 500 mL via INTRAVENOUS

## 2016-12-12 MED ORDER — DEXTROSE 50 % IV SOLN
25.0000 mL | Freq: Once | INTRAVENOUS | Status: AC
  Administered 2016-12-12: 25 mL via INTRAVENOUS

## 2016-12-12 MED ORDER — MISOPROSTOL 25 MCG QUARTER TABLET
25.0000 ug | ORAL_TABLET | ORAL | Status: DC
  Administered 2016-12-12 (×2): 25 ug via VAGINAL
  Filled 2016-12-12: qty 1
  Filled 2016-12-12 (×2): qty 0.25

## 2016-12-12 MED ORDER — OXYTOCIN 40 UNITS IN LACTATED RINGERS INFUSION - SIMPLE MED
1.0000 m[IU]/min | INTRAVENOUS | Status: DC
  Administered 2016-12-12: 2 m[IU]/min via INTRAVENOUS
  Administered 2016-12-13: 4 m[IU]/min via INTRAVENOUS
  Filled 2016-12-12: qty 1000

## 2016-12-12 MED ORDER — OXYCODONE-ACETAMINOPHEN 5-325 MG PO TABS: 2.0000 | ORAL_TABLET | ORAL | Status: DC | PRN

## 2016-12-12 MED ORDER — ACETAMINOPHEN 325 MG PO TABS: 650.0000 mg | ORAL_TABLET | ORAL | Status: DC | PRN

## 2016-12-12 MED ORDER — ONDANSETRON HCL 4 MG/2ML IJ SOLN: 4.0000 mg | Freq: Four times a day (QID) | INTRAMUSCULAR | Status: DC | PRN

## 2016-12-12 NOTE — Progress Notes (Signed)
S: Patient seen & examined for progress of labor. Patient comfortable with epidural.   O:  Vitals:   12/12/16 1918 12/12/16 1931 12/12/16 2001 12/12/16 2031  BP:  114/88 128/76 134/72  Pulse:  86 73 71  Resp:      Temp: 98.2 F (36.8 C)     TempSrc: Oral     SpO2:      Weight:      Height:        Dilation: 6.5 Effacement (%): 60 Cervical Position: Posterior Station: -3 Presentation: Vertex Exam by:: M.Hoppenbauer,RN   FHT: 140s bpm, mod var, +accels, variable decels TOCO: q 1-2 min   A/P: Pitocin at 4 milliunits Will continue to monitor variable decels, will try position changes if they reoccur. Continue expectant management Anticipate SVD   Satira AnisSung W Park, Medical Student PGY-2 12/12/2016 8:48 PM    OB FELLOW MEDICAL STUDENT NOTE ATTESTATION  I have seen and examined this patient.  Patient is comfortable with epidural. She is dilated to 6cm. Pitocin is currently running.   FHT: 135 bpm, mod var, +accels, no decels noted.  TOCO: q2-414min   Continue expectant management.  Anticipate SVD  Jen MowElizabeth Mumaw, DO OB Fellow 12/12/2016, 10:16 PM

## 2016-12-12 NOTE — Anesthesia Pain Management Evaluation Note (Signed)
  CRNA Pain Management Visit Note  Patient: Marcia Bryant, 31 y.o., female  "Hello I am a [facility] of the anesthesia [facility]. We have an anesthesia team available at all times to provide care throughout the hospital, including epidural management and anesthesia for C-section. I don't know your plan for the delivery whether it a natural birth, water birth, IV sedation, nitrous supplementation, doula or epidural, but we want to meet your pain goals."   1.Was your pain managed to your expectations on prior hospitalizations?   Yes   2.What is your expectation for pain management during this hospitalization?     Epidural  3.How can we help you reach that goal? Epidural   Record the patient's initial score and the patient's pain goal.   Pain: 0  Pain Goal: 4 [name] wants you to be able to say your pain was always managed very well.  [name] 12/12/2016

## 2016-12-12 NOTE — H&P (Signed)
LABOR AND DELIVERY ADMISSION HISTORY AND PHYSICAL NOTE  Marcia Bryant is a 31 y.o. female [phone] with IUP at 5134w0d by US presenting for IOL for GDMA1.  She has had an uneventful pregnancy to date. She denies any CP, SOB, NV, diarrhea, LE edema, HA or changes in vision. She reports positive fetal movement and occasional contractions. She denies leakage of fluid or vaginal bleeding.  Prenatal History/Complications:  Past Medical History: Past Medical History:  Diagnosis Date  . Bacterial vaginosis   . Diabetes mellitus without complication (HCC)   . Gestational diabetes   . Preeclampsia 01/25/2013  . Umbilical hernia 05/20/2016  . Yeast infection     Past Surgical History: History reviewed. No pertinent surgical history.  Obstetrical History: OB History    Gravida Para Term Preterm AB Living   4 2 2   1 2    SAB TAB Ectopic Multiple Live Births   1       2      Social History: Social History   Social History  . Marital status: Married    Spouse name: Abagail KitchensFelix Zowonoo  . Number of children: 1  . Years of education: 312   Occupational History  . [employer]    [employer]   Social History Main Topics  . Smoking status: Never Smoker  . Smokeless tobacco: Never Used  . Alcohol use No  . Drug use: No  . Sexual activity: Yes    Partners: Male     Comment: pregnant   Other Topics Concern  . None   Social History Narrative  . None    Family History: Family History  Problem Relation Age of Onset  . Hypertension Father   . Hypertension Paternal Grandmother     Allergies: No Known Allergies  Prescriptions Prior to Admission  Medication Sig Dispense Refill Last Dose  . aspirin EC 81 MG tablet Take 1 tablet (81 mg total) by mouth daily. 30 tablet 6 Past Week at Unknown time  . Prenatal Vit-Fe Fumarate-FA (PRENATAL MULTIVITAMIN) TABS tablet Take 1 tablet by mouth daily.    Past Week at Unknown time     Review of Systems   All systems reviewed  and negative except as stated in HPI  Blood pressure 121/78, pulse 84, temperature 98.9 F (37.2 C), temperature source Oral, resp. rate 16, height 5\' 4"  (1.626 m), weight 97.5 kg (215 lb), last menstrual period 03/30/2016. General appearance: alert and cooperative Lungs: clear to auscultation bilaterally Heart: regular rate and rhythm Abdomen: soft, non-tender; bowel sounds normal. Stable umbilical hernia Extremities: No calf swelling or tenderness Presentation: cephalic Fetal monitoring: FHR 145, mod variability, pos accels, no decels contractions x1 Uterine activity:  Closed, thick and posterior     Prenatal labs: ABO, Rh: --/--/O POS (02/03 45400748) Antibody: NEG (02/03 0748) Rubella: immune RPR: Non Reactive (01/04 1212)  HBsAg: Negative (07/12 1041)  HIV: Non Reactive (01/04 1212)  GBS: Negative (01/04 1102)  1 hr Glucola: GDMA1 Genetic screening:  neg Anatomy US: NI MFM  Prenatal Transfer Tool  Maternal Diabetes: Yes:  Diabetes Type:  Diet controlled Genetic Screening: Normal Maternal Ultrasounds/Referrals: Normal Fetal Ultrasounds or other Referrals:  None Maternal Substance Abuse:  No Significant Maternal Medications:  None Significant Maternal Lab Results: None  Results for orders placed or performed during the hospital encounter of 12/12/16 (from the past 24 hour(s))  CBC   Collection Time: 12/12/16  7:48 AM  Result Value Ref Range   WBC 10.8 (  H) 4.0 - 10.5 K/uL   RBC 4.56 3.87 - 5.11 MIL/uL   Hemoglobin 12.9 12.0 - 15.0 g/dL   HCT 16.1 09.6 - 04.5 %   MCV 80.3 78.0 - 100.0 fL   MCH 28.3 26.0 - 34.0 pg   MCHC 35.2 30.0 - 36.0 g/dL   RDW 40.9 81.1 - 91.4 %   Platelets 222 150 - 400 K/uL  Glucose, random   Collection Time: 12/12/16  7:48 AM  Result Value Ref Range   Glucose, Bld 217 (H) 65 - 99 mg/dL  Type and screen [facility]   Collection Time: 12/12/16  7:48 AM  Result Value Ref Range   ABO/RH(D) O POS    Antibody Screen NEG     Sample Expiration 12/15/2016     Patient Active Problem List   Diagnosis Date Noted  . Gestational diabetes, diet controlled 12/12/2016  . Gestational diabetes mellitus (GDM), antepartum 06/15/2016  . failed early 1h (154) 05/25/2016  . Supervision of high-risk pregnancy 05/20/2016  . BMI 33.0-33.9,adult 05/20/2016  . Obesity in pregnancy 05/20/2016  . History of pre-eclampsia 05/20/2016  . History of shoulder dystocia in prior pregnancy 05/20/2016  . Umbilical hernia 05/20/2016    Assessment: Marcia Bryant is a 31 y.o. [id number] at [redacted]w[redacted]d here for IOL for GDMA1.  CBG this morning was 217.  Started glucostabilizer and will give insulin as needed and q2hr CBGs.  Reporting occasional contractions with no loss of fluid or vaginal bleeding. Cervix appeared closed on exam and was posterior. Confirmed cephalic presentation with [place] at the bedside.   #Labor: IOL with vaginal cytotec. Will place foley bulb when possible and then pitocin. Anticipate SVD. #Pain: epidural #FWB:  Cat 1 #ID: GBS neg #MOF: both #MOC: depo #Circ: outpatient #hyperglycemia: glucostabilizer and q2hr CBGs   Renne Musca, MD PGY-1 12/12/2016, 9:38 AM   OB FELLOW HISTORY AND PHYSICAL ATTESTATION  I have seen and examined this patient; I agree with above documentation in the resident's note.    Ernestina Penna 12/12/2016, 11:13 AM

## 2016-12-12 NOTE — Anesthesia Preprocedure Evaluation (Addendum)
Anesthesia Evaluation  Patient identified by MRN, date of birth, ID band Patient awake    Reviewed: Allergy & Precautions, NPO status , Patient's Chart, lab work & pertinent test results  Airway Mallampati: II  TM Distance: >3 FB Neck ROM: Full    Dental  (+) Teeth Intact, Dental Advisory Given   Pulmonary neg pulmonary ROS,    Pulmonary exam normal breath sounds clear to auscultation       Cardiovascular hypertension, negative cardio ROS Normal cardiovascular exam Rhythm:Regular Rate:Normal     Neuro/Psych negative neurological ROS     GI/Hepatic negative GI ROS, Neg liver ROS,   Endo/Other  diabetes, Well Controlled, GestationalObesity   Renal/GU negative Renal ROS     Musculoskeletal negative musculoskeletal ROS (+)   Abdominal   Peds  Hematology negative hematology ROS (+) Plt 222k   Anesthesia Other Findings Day of surgery medications reviewed with the patient.  Reproductive/Obstetrics (+) Pregnancy Pre-eclampsia with prior pregnancy                             Anesthesia Physical Anesthesia Plan  ASA: II and emergent  Anesthesia Plan: Epidural   Post-op Pain Management:    Induction:   Airway Management Planned: Natural Airway  Additional Equipment:   Intra-op Plan:   Post-operative Plan:   Informed Consent: I have reviewed the patients History and Physical, chart, labs and discussed the procedure including the risks, benefits and alternatives for the proposed anesthesia with the patient or authorized representative who has indicated his/her understanding and acceptance.   Dental advisory given  Plan Discussed with:   Anesthesia Plan Comments: (Patient identified. Risks/Benefits/Options discussed with patient including but not limited to bleeding, infection, nerve damage, paralysis, failed block, incomplete pain control, headache, blood pressure changes, nausea,  vomiting, reactions to medication both or allergic, itching and postpartum back pain. Confirmed with bedside nurse the patient's most recent platelet count. Confirmed with patient that they are not currently taking any anticoagulation, have any bleeding history or any family history of bleeding disorders. Patient expressed understanding and wished to proceed. All questions were answered.   C/Section for failure to progress. Will use epidural.)       Anesthesia Quick Evaluation

## 2016-12-12 NOTE — Anesthesia Procedure Notes (Signed)
Epidural Patient location during procedure: OB Start time: 12/12/2016 6:10 PM End time: 12/12/2016 6:15 PM  Staffing Anesthesiologist: Cecile HearingURK, Treyana Sturgell EDWARD Performed: anesthesiologist   Preanesthetic Checklist Completed: patient identified, pre-op evaluation, timeout performed, IV checked, risks and benefits discussed and monitors and equipment checked  Epidural Patient position: sitting Prep: DuraPrep Patient monitoring: blood pressure and continuous pulse ox Approach: midline Location: L3-L4 Injection technique: LOR air  Needle:  Needle type: Tuohy  Needle gauge: 17 G Needle length: 9 cm Needle insertion depth: 6 cm Catheter size: 19 Gauge Catheter at skin depth: 11 cm Test dose: negative and Other (1% Lidocaine)  Additional Notes Patient identified.  Risk benefits discussed including failed block, incomplete pain control, headache, nerve damage, paralysis, blood pressure changes, nausea, vomiting, reactions to medication both toxic or allergic, and postpartum back pain.  Patient expressed understanding and wished to proceed.  All questions were answered.  Sterile technique used throughout procedure and epidural site dressed with sterile barrier dressing. No paresthesia or other complications noted. The patient did not experience any signs of intravascular injection such as tinnitus or metallic taste in mouth nor signs of intrathecal spread such as rapid motor block. Please see nursing notes for vital signs. Reason for block:procedure for pain

## 2016-12-12 NOTE — Progress Notes (Signed)
Discussed with nursing. Patient first CBG was sent off of blood work. Repeat was 77 before insulin initated. Will continue to monitor cbg q2 hours. Will D/C glucostabilizer at this time.

## 2016-12-12 NOTE — Progress Notes (Signed)
Hypoglycemic Event  CBG: 45  Treatment:  25ml d50  Symptoms: diaphoretic  Follow-up CBG: Time:2343 CBG Result:82  Possible Reasons for Event: clear liquids d/t labor  Comments/MD notified: hypoglycemic protocol intitiated    Troy SineWalker, Daisie Haft M

## 2016-12-13 ENCOUNTER — Encounter (HOSPITAL_COMMUNITY)
Admission: RE | Disposition: A | Discharge status: Home / Self Care | Payer: Self-pay | Source: Ambulatory Visit | Attending: Obstetrics & Gynecology

## 2016-12-13 ENCOUNTER — Encounter (HOSPITAL_COMMUNITY): Payer: Self-pay

## 2016-12-13 DIAGNOSIS — Z3A40 40 weeks gestation of pregnancy: Secondary | ICD-10-CM

## 2016-12-13 DIAGNOSIS — O24429 Gestational diabetes mellitus in childbirth, unspecified control: Secondary | ICD-10-CM

## 2016-12-13 LAB — GLUCOSE, CAPILLARY
GLUCOSE-CAPILLARY: 100 mg/dL — AB (ref 65–99)
GLUCOSE-CAPILLARY: 134 mg/dL — AB (ref 65–99)
GLUCOSE-CAPILLARY: 82 mg/dL (ref 65–99)
GLUCOSE-CAPILLARY: 83 mg/dL (ref 65–99)

## 2016-12-13 LAB — RPR: RPR Ser Ql: NONREACTIVE

## 2016-12-13 SURGERY — Surgical Case
Anesthesia: Epidural

## 2016-12-13 MED ORDER — DIBUCAINE 1 % RE OINT: 1.0000 "application " | TOPICAL_OINTMENT | RECTAL | Status: DC | PRN

## 2016-12-13 MED ORDER — ACETAMINOPHEN 500 MG PO TABS
1000.0000 mg | ORAL_TABLET | Freq: Four times a day (QID) | ORAL | Status: DC | PRN
  Administered 2016-12-13: 1000 mg via ORAL
  Filled 2016-12-13: qty 2

## 2016-12-13 MED ORDER — SODIUM BICARBONATE 8.4 % IV SOLN
INTRAVENOUS | Status: AC
  Filled 2016-12-13: qty 50

## 2016-12-13 MED ORDER — FENTANYL CITRATE (PF) 100 MCG/2ML IJ SOLN
INTRAMUSCULAR | Status: AC
  Filled 2016-12-13: qty 2

## 2016-12-13 MED ORDER — OXYTOCIN 10 UNIT/ML IJ SOLN
INTRAVENOUS | Status: DC | PRN
  Administered 2016-12-13: 40 [IU] via INTRAVENOUS

## 2016-12-13 MED ORDER — ACETAMINOPHEN 325 MG PO TABS
650.0000 mg | ORAL_TABLET | ORAL | Status: DC | PRN
  Administered 2016-12-14: 650 mg via ORAL
  Filled 2016-12-13: qty 2

## 2016-12-13 MED ORDER — NALBUPHINE HCL 10 MG/ML IJ SOLN: 5.0000 mg | Freq: Once | INTRAMUSCULAR | Status: DC | PRN

## 2016-12-13 MED ORDER — DIPHENHYDRAMINE HCL 25 MG PO CAPS: 25.0000 mg | ORAL_CAPSULE | ORAL | Status: DC | PRN

## 2016-12-13 MED ORDER — PHENYLEPHRINE 40 MCG/ML (10ML) SYRINGE FOR IV PUSH (FOR BLOOD PRESSURE SUPPORT)
PREFILLED_SYRINGE | INTRAVENOUS | Status: AC
  Filled 2016-12-13: qty 10

## 2016-12-13 MED ORDER — SCOPOLAMINE 1 MG/3DAYS TD PT72
MEDICATED_PATCH | TRANSDERMAL | Status: AC
  Filled 2016-12-13: qty 1

## 2016-12-13 MED ORDER — WITCH HAZEL-GLYCERIN EX PADS: 1.0000 "application " | MEDICATED_PAD | CUTANEOUS | Status: DC | PRN

## 2016-12-13 MED ORDER — SCOPOLAMINE 1 MG/3DAYS TD PT72
MEDICATED_PATCH | TRANSDERMAL | Status: DC | PRN
  Administered 2016-12-13: 1 via TRANSDERMAL

## 2016-12-13 MED ORDER — MIDAZOLAM HCL 2 MG/2ML IJ SOLN
INTRAMUSCULAR | Status: DC | PRN
  Administered 2016-12-13: 1 mg via INTRAVENOUS

## 2016-12-13 MED ORDER — NALOXONE HCL 2 MG/2ML IJ SOSY
1.0000 ug/kg/h | PREFILLED_SYRINGE | INTRAVENOUS | Status: DC | PRN
  Filled 2016-12-13: qty 2

## 2016-12-13 MED ORDER — DIPHENHYDRAMINE HCL 25 MG PO CAPS: 25.0000 mg | ORAL_CAPSULE | Freq: Four times a day (QID) | ORAL | Status: DC | PRN

## 2016-12-13 MED ORDER — OXYCODONE HCL 5 MG PO TABS: 10.0000 mg | ORAL_TABLET | ORAL | Status: DC | PRN

## 2016-12-13 MED ORDER — FENTANYL CITRATE (PF) 100 MCG/2ML IJ SOLN
INTRAMUSCULAR | Status: DC | PRN
  Administered 2016-12-13 (×2): 50 ug via INTRAVENOUS

## 2016-12-13 MED ORDER — LACTATED RINGERS IV SOLN
INTRAVENOUS | Status: DC | PRN
  Administered 2016-12-13: 07:00:00 via INTRAVENOUS

## 2016-12-13 MED ORDER — IBUPROFEN 600 MG PO TABS
600.0000 mg | ORAL_TABLET | Freq: Four times a day (QID) | ORAL | Status: DC | PRN
  Administered 2016-12-14: 600 mg via ORAL

## 2016-12-13 MED ORDER — SODIUM CHLORIDE 0.9 % IR SOLN
Status: DC | PRN
  Administered 2016-12-13: 1000 mL

## 2016-12-13 MED ORDER — MIDAZOLAM HCL 2 MG/2ML IJ SOLN
INTRAMUSCULAR | Status: AC
  Filled 2016-12-13: qty 2

## 2016-12-13 MED ORDER — SIMETHICONE 80 MG PO CHEW
80.0000 mg | CHEWABLE_TABLET | Freq: Three times a day (TID) | ORAL | Status: DC
  Administered 2016-12-13 – 2016-12-15 (×4): 80 mg via ORAL
  Filled 2016-12-13 (×3): qty 1

## 2016-12-13 MED ORDER — CHLOROPROCAINE HCL (PF) 3 % IJ SOLN
INTRAMUSCULAR | Status: AC
  Filled 2016-12-13: qty 20

## 2016-12-13 MED ORDER — SODIUM BICARBONATE 8.4 % IV SOLN
INTRAVENOUS | Status: DC | PRN
  Administered 2016-12-13 (×3): 5 mL via EPIDURAL

## 2016-12-13 MED ORDER — BUPIVACAINE HCL (PF) 0.5 % IJ SOLN
INTRAMUSCULAR | Status: DC | PRN
  Administered 2016-12-13: 30 mL

## 2016-12-13 MED ORDER — OXYTOCIN 10 UNIT/ML IJ SOLN
INTRAMUSCULAR | Status: AC
  Filled 2016-12-13: qty 4

## 2016-12-13 MED ORDER — FENTANYL CITRATE (PF) 100 MCG/2ML IJ SOLN: 25.0000 ug | INTRAMUSCULAR | Status: DC | PRN

## 2016-12-13 MED ORDER — MORPHINE SULFATE (PF) 0.5 MG/ML IJ SOLN
INTRAMUSCULAR | Status: DC | PRN
  Administered 2016-12-13: 4 mg via EPIDURAL
  Administered 2016-12-13: 1 mg via INTRAVENOUS

## 2016-12-13 MED ORDER — KETOROLAC TROMETHAMINE 30 MG/ML IJ SOLN
30.0000 mg | Freq: Four times a day (QID) | INTRAMUSCULAR | Status: DC | PRN
  Administered 2016-12-13: 30 mg via INTRAMUSCULAR

## 2016-12-13 MED ORDER — DIPHENHYDRAMINE HCL 50 MG/ML IJ SOLN: 12.5000 mg | INTRAMUSCULAR | Status: DC | PRN

## 2016-12-13 MED ORDER — ZOLPIDEM TARTRATE 5 MG PO TABS: 5.0000 mg | ORAL_TABLET | Freq: Every evening | ORAL | Status: DC | PRN

## 2016-12-13 MED ORDER — NALBUPHINE HCL 10 MG/ML IJ SOLN: 5.0000 mg | INTRAMUSCULAR | Status: DC | PRN

## 2016-12-13 MED ORDER — KETOROLAC TROMETHAMINE 30 MG/ML IJ SOLN
INTRAMUSCULAR | Status: AC
  Filled 2016-12-13: qty 1

## 2016-12-13 MED ORDER — ONDANSETRON HCL 4 MG/2ML IJ SOLN: 4.0000 mg | Freq: Three times a day (TID) | INTRAMUSCULAR | Status: DC | PRN

## 2016-12-13 MED ORDER — BUPIVACAINE HCL (PF) 0.5 % IJ SOLN
INTRAMUSCULAR | Status: AC
  Filled 2016-12-13: qty 30

## 2016-12-13 MED ORDER — LACTATED RINGERS IV SOLN
INTRAVENOUS | Status: DC
  Administered 2016-12-14 (×2): via INTRAVENOUS

## 2016-12-13 MED ORDER — MENTHOL 3 MG MT LOZG
1.0000 | LOZENGE | OROMUCOSAL | Status: DC | PRN
Start: 2016-12-13 — End: 2016-12-15

## 2016-12-13 MED ORDER — SIMETHICONE 80 MG PO CHEW
80.0000 mg | CHEWABLE_TABLET | ORAL | Status: DC
  Administered 2016-12-14 (×2): 80 mg via ORAL
  Filled 2016-12-13 (×2): qty 1

## 2016-12-13 MED ORDER — ONDANSETRON HCL 4 MG/2ML IJ SOLN
INTRAMUSCULAR | Status: DC | PRN
Start: 2016-12-13 — End: 2016-12-13
  Administered 2016-12-13: 4 mg via INTRAVENOUS

## 2016-12-13 MED ORDER — PRENATAL MULTIVITAMIN CH
1.0000 | ORAL_TABLET | Freq: Every day | ORAL | Status: DC
  Administered 2016-12-14 – 2016-12-15 (×2): 1 via ORAL
  Filled 2016-12-13 (×2): qty 1

## 2016-12-13 MED ORDER — NALOXONE HCL 0.4 MG/ML IJ SOLN: 0.4000 mg | INTRAMUSCULAR | Status: DC | PRN

## 2016-12-13 MED ORDER — ACETAMINOPHEN 500 MG PO TABS
1000.0000 mg | ORAL_TABLET | Freq: Four times a day (QID) | ORAL | Status: AC
  Administered 2016-12-13 – 2016-12-14 (×3): 1000 mg via ORAL
  Filled 2016-12-13 (×3): qty 2

## 2016-12-13 MED ORDER — OXYCODONE HCL 5 MG PO TABS
5.0000 mg | ORAL_TABLET | ORAL | Status: DC | PRN
  Administered 2016-12-14 – 2016-12-15 (×2): 5 mg via ORAL
  Filled 2016-12-13 (×2): qty 1

## 2016-12-13 MED ORDER — GENTAMICIN SULFATE 40 MG/ML IJ SOLN
190.0000 mg | Freq: Three times a day (TID) | INTRAVENOUS | Status: DC
  Administered 2016-12-13: 190 mg via INTRAVENOUS
  Filled 2016-12-13 (×2): qty 4.75

## 2016-12-13 MED ORDER — COCONUT OIL OIL: 1.0000 "application " | TOPICAL_OIL | Status: DC | PRN

## 2016-12-13 MED ORDER — OXYTOCIN 40 UNITS IN LACTATED RINGERS INFUSION - SIMPLE MED: 2.5000 [IU]/h | INTRAVENOUS | Status: AC

## 2016-12-13 MED ORDER — MEPERIDINE HCL 25 MG/ML IJ SOLN: 6.2500 mg | INTRAMUSCULAR | Status: DC | PRN

## 2016-12-13 MED ORDER — CEFAZOLIN SODIUM-DEXTROSE 2-4 GM/100ML-% IV SOLN
INTRAVENOUS | Status: AC
  Filled 2016-12-13: qty 100

## 2016-12-13 MED ORDER — IBUPROFEN 600 MG PO TABS
600.0000 mg | ORAL_TABLET | Freq: Four times a day (QID) | ORAL | Status: DC
  Administered 2016-12-13 – 2016-12-15 (×7): 600 mg via ORAL
  Filled 2016-12-13 (×8): qty 1

## 2016-12-13 MED ORDER — CEFAZOLIN SODIUM-DEXTROSE 2-3 GM-% IV SOLR
INTRAVENOUS | Status: DC | PRN
  Administered 2016-12-13: 2 g via INTRAVENOUS

## 2016-12-13 MED ORDER — ONDANSETRON HCL 4 MG/2ML IJ SOLN
INTRAMUSCULAR | Status: AC
  Filled 2016-12-13: qty 2

## 2016-12-13 MED ORDER — SIMETHICONE 80 MG PO CHEW: 80.0000 mg | CHEWABLE_TABLET | ORAL | Status: DC | PRN

## 2016-12-13 MED ORDER — SODIUM CHLORIDE 0.9% FLUSH: 3.0000 mL | INTRAVENOUS | Status: DC | PRN

## 2016-12-13 MED ORDER — SODIUM CHLORIDE 0.9 % IV SOLN
2.0000 g | Freq: Four times a day (QID) | INTRAVENOUS | Status: DC
  Administered 2016-12-13: 2 g via INTRAVENOUS
  Filled 2016-12-13 (×2): qty 2000

## 2016-12-13 MED ORDER — KETOROLAC TROMETHAMINE 30 MG/ML IJ SOLN: 30.0000 mg | Freq: Four times a day (QID) | INTRAMUSCULAR | Status: DC | PRN

## 2016-12-13 MED ORDER — PIPERACILLIN-TAZOBACTAM 3.375 G IVPB
3.3750 g | Freq: Three times a day (TID) | INTRAVENOUS | Status: AC
  Administered 2016-12-13 – 2016-12-14 (×3): 3.375 g via INTRAVENOUS
  Filled 2016-12-13 (×3): qty 50

## 2016-12-13 MED ORDER — TETANUS-DIPHTH-ACELL PERTUSSIS 5-2.5-18.5 LF-MCG/0.5 IM SUSP: 0.5000 mL | Freq: Once | INTRAMUSCULAR | Status: DC

## 2016-12-13 MED ORDER — MORPHINE SULFATE (PF) 0.5 MG/ML IJ SOLN
INTRAMUSCULAR | Status: AC
  Filled 2016-12-13: qty 10

## 2016-12-13 MED ORDER — LIDOCAINE-EPINEPHRINE (PF) 2 %-1:200000 IJ SOLN
INTRAMUSCULAR | Status: AC
  Filled 2016-12-13: qty 20

## 2016-12-13 MED ORDER — PHENYLEPHRINE HCL 10 MG/ML IJ SOLN
INTRAMUSCULAR | Status: DC | PRN
  Administered 2016-12-13: 120 ug via INTRAVENOUS

## 2016-12-13 MED ORDER — SENNOSIDES-DOCUSATE SODIUM 8.6-50 MG PO TABS
2.0000 | ORAL_TABLET | ORAL | Status: DC
  Administered 2016-12-14 (×2): 2 via ORAL
  Filled 2016-12-13 (×2): qty 2

## 2016-12-13 SURGICAL SUPPLY — 35 items
BARRIER ADHS 3X4 INTERCEED (GAUZE/BANDAGES/DRESSINGS) IMPLANT
BENZOIN TINCTURE PRP APPL 2/3 (GAUZE/BANDAGES/DRESSINGS) ×3 IMPLANT
CHLORAPREP W/TINT 26ML (MISCELLANEOUS) ×3 IMPLANT
CLAMP CORD UMBIL (MISCELLANEOUS) IMPLANT
CLOSURE STERI STRIP 1/2 X4 (GAUZE/BANDAGES/DRESSINGS) ×3 IMPLANT
CLOTH BEACON ORANGE TIMEOUT ST (SAFETY) ×3 IMPLANT
DRSG OPSITE POSTOP 4X10 (GAUZE/BANDAGES/DRESSINGS) ×3 IMPLANT
ELECT REM PT RETURN 9FT ADLT (ELECTROSURGICAL) ×3
ELECTRODE REM PT RTRN 9FT ADLT (ELECTROSURGICAL) ×1 IMPLANT
EXTRACTOR VACUUM KIWI (MISCELLANEOUS) IMPLANT
GLOVE BIO SURGEON STRL SZ 6.5 (GLOVE) ×2 IMPLANT
GLOVE BIO SURGEONS STRL SZ 6.5 (GLOVE) ×1
GLOVE BIOGEL PI IND STRL 7.0 (GLOVE) ×2 IMPLANT
GLOVE BIOGEL PI INDICATOR 7.0 (GLOVE) ×4
GOWN STRL REUS W/TWL LRG LVL3 (GOWN DISPOSABLE) ×6 IMPLANT
KIT ABG SYR 3ML LUER SLIP (SYRINGE) IMPLANT
NEEDLE HYPO 18GX1.5 BLUNT FILL (NEEDLE) ×3 IMPLANT
NEEDLE HYPO 22GX1.5 SAFETY (NEEDLE) IMPLANT
NEEDLE HYPO 25X5/8 SAFETYGLIDE (NEEDLE) IMPLANT
NS IRRIG 1000ML POUR BTL (IV SOLUTION) ×3 IMPLANT
PACK C SECTION WH (CUSTOM PROCEDURE TRAY) ×3 IMPLANT
PAD ABD 7.5X8 STRL (GAUZE/BANDAGES/DRESSINGS) ×6 IMPLANT
PAD OB MATERNITY 4.3X12.25 (PERSONAL CARE ITEMS) ×3 IMPLANT
PENCIL SMOKE EVAC W/HOLSTER (ELECTROSURGICAL) ×3 IMPLANT
RETRACTOR WND ALEXIS 25 LRG (MISCELLANEOUS) IMPLANT
RTRCTR WOUND ALEXIS 25CM LRG (MISCELLANEOUS)
SUT VIC AB 0 CT1 36 (SUTURE) ×18 IMPLANT
SUT VIC AB 2-0 CT1 27 (SUTURE) ×2
SUT VIC AB 2-0 CT1 TAPERPNT 27 (SUTURE) ×1 IMPLANT
SUT VIC AB 4-0 KS 27 (SUTURE) ×3 IMPLANT
SUT VIC AB 4-0 PS2 27 (SUTURE) ×3 IMPLANT
SYR 20CC LL (SYRINGE) ×3 IMPLANT
SYR CONTROL 10ML LL (SYRINGE) IMPLANT
TOWEL OR 17X24 6PK STRL BLUE (TOWEL DISPOSABLE) ×3 IMPLANT
TRAY FOLEY CATH SILVER 14FR (SET/KITS/TRAYS/PACK) IMPLANT

## 2016-12-13 NOTE — Op Note (Signed)
Cesarean Section Operative Report  Marcia Bryant  12/12/2016 - 12/13/2016  Indications: Failure to progress and triple I   Pre-operative Diagnosis: Cesearan section - failure to descend.   Post-operative Diagnosis: Same   Surgeon: Surgeon(s) and Role:    * Adam PhenixJames G Arnold, MD - Primary    * [address]lizabeth Woodland BellaireMumaw, DO - [place] - Assisting   Attending Attestation: I was present and scrubbed for the entire procedure.   Assistants: Jen MowElizabeth Merl Bommarito, DO - OB Fellow  Anesthesia: epidural    Estimated Blood Loss: 900 ml  Total IV Fluids: 1400 ml LR  Urine Output:: 200 ml urine  Specimens: Placenta  Findings: Viable female infant in cephalic presentation; Apgars 8/9; weight 3990g, 8#12oz; arterial cord pH 7.396; cloudy meconium stained amniotic fluid; intact placenta with three vessel cord; normal uterus, fallopian tubes and ovaries bilaterally.  Baby condition / location:  Couplet care / Skin to Skin   Complications: no complications  Indications: Marcia Bryant is a 31 y.o. [phone] with an IUP 656w1d presenting for IOL for GDMA1 with failure to descend/dilate.  The risks, benefits, complications, treatment options, and exected outcomes were discussed with the patient . The patient dwith the proposed plan, giving informed consent. identified as Marcia Bryant and the procedure verified as C-Section Delivery.  Procedure Details:  The patient was taken back to the operative suite where epidural anesthesia was dosed.  A time out was held and the above information confirmed.   After induction of anesthesia, the patient was draped and prepped in the usual sterile manner and placed in a dorsal supine position with a leftward tilt. A Pfannenstiel incision was made and carried down through the subcutaneous tissue to the fascia. Fascial incision was made and with Mayo scissors extended transversely. The fascia was separated from the underlying rectus tissue superiorly  and inferiorly. The peritoneum was identified and bluntly entered and extended longitudinally. Alexis retractor was placed. A low transverse uterine incision was made and extended bluntly. Delivered from cephalic presentation was a viable infant with Apgars and weight as above.  After waiting 60 seconds for delayed cord cutting, the umbilical cord was clamped and cut cord blood was obtained for evaluation. Cord ph was sent. The placenta was removed Intact and appeared normal. The uterine outline, tubes and ovaries appeared normal. The uterine incision was closed with running locked sutures of 0 Vicryl with an imbricating layer of the same. Hemostasis was observed. The peritoneum was closed with 2-0 Vicryl. The rectus muscles were examined and hemostasis observed. The fascia was then reapproximated with running sutures of 0Vicryl. A total of 30 ml 0.5% Marcaine was injected subcutaneously at the margins of the incision. The subcuticular closure was performed using 2-0plain gut. The skin was closed with 4-0Vicryl.   Instrument, sponge, and needle counts were correct prior the abdominal closure and were correct at the conclusion of the case.     Disposition: PACU - hemodynamically stable.   Maternal Condition: stable    Signed: Jen MowElizabeth Justo Hengel, DO OB Fellow 12/13/2016 9:12 AM

## 2016-12-13 NOTE — Progress Notes (Signed)
Patient ID: Marcia Bryant, female   DOB: Feb 20, 1986, 31 y.o.   MRN: [id number]  S: Patient seen & examined for progress of labor. Called by RN due to patient had an axillary temperature of 102.78F. Patient comfortable with epidural. Patient feels hot to touch, tachycardic, and has fetal tachycardia.     O:  Vitals:   12/13/16 0257 12/13/16 0300 12/13/16 0310 12/13/16 0330  BP:  132/85  (!) 141/86  Pulse:  (!) 115  (!) 127  Resp:      Temp: 97.6 F (36.4 C)  (!) 102.4 F (39.1 C)   TempSrc: Oral  Axillary   SpO2:      Weight:      Height:        Dilation: 9 Effacement (%): 80 Cervical Position: Posterior Station: -1, 0 Presentation: Vertex Exam by:: dr Omer Jackmumaw   FHT: 165 bpm, mod var, +accels, no decels IUPC: adequate >200 MVU (about 250mvu), q2-273min  Attempted pushing with patient to reduce cervix, unable to be reduced.   A/P: Amp/Gent to be started Tylenol 1000 mg given IV fluid bolus, 500cc Continue expectant management Anticipate SVD Will watch closely for progress of labor and fetal well being

## 2016-12-13 NOTE — Progress Notes (Addendum)
Patient ID: Marcia Bryant, female   DOB: 1986-01-27, 31 y.o.   MRN: [id number]  S: Patient seen & examined for progress of labor. Patient comfortable with epidural. Discussed with patient regarding her fever and possibly infection (triple I), as well as the concern that she has not made any further progress in dilation or descent of baby. Recommended a cesarean section for failure to dilate/failure to descend, reviewed risks/benefits with patient, her and her husband agreed and signed consent.     O:  Vitals:   12/13/16 0455 12/13/16 0459 12/13/16 0500 12/13/16 0505  BP:   130/75   Pulse:  (!) 131 (!) 131 (!) 129  Resp:      Temp: (!) 102 F (38.9 C)     TempSrc: Axillary     SpO2:      Weight:      Height:        Dilation: 8.5 Effacement (%): 80 Cervical Position: Posterior Station: -1, 0 Presentation: Vertex Exam by:: dr Omer Jackmumaw   FHT: 165 bpm, mod var, +accels, no decels IUPC: adequate >200 MVU    A/P: Failure to dilate/descend Move towards cesarean section D/C pitocin Triple I - received gentamicin and ampicillin, 1000mg  Tylenol PO, 500cc IVF bolus, will receive abx x24 hours post surgery  Attestation of Attending Supervision of Obstetric Fellow: Evaluation and management procedures were performed by the Obstetric Fellow under my supervision and collaboration.  I have reviewed the Obstetric Fellow's note and chart, and I agree with the management and plan.  Scheryl DarterJAMES Ghazal Pevey, MD, FACOG Attending Obstetrician & Gynecologist Faculty Practice, [facility]

## 2016-12-13 NOTE — Progress Notes (Signed)
Attempted to stand with pt to check orthostatic vs, pt unable to tolerate standing long enough to get BP. Will attempt again at next vs and fundal check. Sherald BargeMatthews, Rease Swinson L

## 2016-12-13 NOTE — Progress Notes (Signed)
S: Patient seen & examined for progress of labor. Patient uncomfortable with contractions with epidural pain.  Also complaining of shakes / feeling cold.  Extra warm blankets have been provided.   O:  Vitals:   12/12/16 2331 12/13/16 0000 12/13/16 0030 12/13/16 0100  BP: (!) 111/59 115/67 111/60 103/63  Pulse: 88 94 (!) 104 (!) 101  Resp:      Temp: 98.1 F (36.7 C)     TempSrc: Oral     SpO2:      Weight:      Height:        Dilation: 8.5 Effacement (%): 70 Cervical Position: Posterior Station: -1 Presentation: Vertex Exam by:: amwalker,rn   FHT: 150s bpm, mod var, +accels, few variable decels TOCO: q 1-2 min   A/P: Pt now s/p IUPC, 235 [place] units in 10 mins Pt was found to have POC glucose of 45 and given given dextrose 50% solution 25 ml at 23:35 and had repeat glucose of 74. Continue to check POC glucose q2h Continue amnioinfusion at 125 ml/hr for variable decels on FHT Continue expectant management Anticipate SVD   Satira AnisSung W Park, Medical Student PGY-2 12/13/2016 1:23 AM   OB FELLOW MEDICAL STUDENT NOTE ATTESTATION  I have seen and examined this patient. Note this is a Psychologist, occupationalmedical student note and as such does not necessarily reflect the patient's plan of care. Please see labor progress note for this date of service.    Jen MowElizabeth Mumaw, DO OB Fellow 12/13/2016, 5:11 AM

## 2016-12-13 NOTE — Addendum Note (Signed)
Addendum  created 12/13/16 1420 by Earmon PhoenixValerie P Lindel Marcell, CRNA   Sign clinical note

## 2016-12-13 NOTE — Progress Notes (Signed)
Patient ID: Marcia Bryant, female   DOB: Jan 07, 1986, 31 y.o.   MRN: [id number]  S: Patient seen & examined for progress of labor. Patient comfortable with epidural.     O:  Vitals:   12/12/16 2331 12/13/16 0000 12/13/16 0030 12/13/16 0100  BP: (!) 111/59 115/67 111/60 103/63  Pulse: 88 94 (!) 104 (!) 101  Resp:      Temp: 98.1 F (36.7 C)     TempSrc: Oral     SpO2:      Weight:      Height:        8.5/80/-1 to 0 station  FHT: 140 bpm, mod var, +accels, no decels TOCO: q2-754min  IUPC placed with ease.   A/P: IUPC placed, awaiting 10 min to determine adequacy Continue expectant management Anticipate SVD

## 2016-12-13 NOTE — Transfer of Care (Signed)
Immediate Anesthesia Transfer of Care Note  Patient: Marcia Bryant  Procedure(s) Performed: Procedure(s): CESAREAN SECTION (N/A)  Patient Location: PACU  Anesthesia Type:Epidural  Level of Consciousness: awake, alert , oriented and patient cooperative  Airway & Oxygen Therapy: Patient Spontanous Breathing  Post-op Assessment: Report given to RN and Post -op Vital signs reviewed and stable  Post vital signs: Reviewed and stable 122/75, 106 ST, 98% spo2 on RA  Last Vitals:  Vitals:   12/13/16 0615 12/13/16 0620  BP:    Pulse: (!) 134 (!) 130  Resp:    Temp:      Last Pain:  Vitals:   12/13/16 0516  TempSrc:   PainSc: 0-No pain         Complications: No apparent anesthesia complications

## 2016-12-13 NOTE — Anesthesia Postprocedure Evaluation (Signed)
Anesthesia Post Note  Patient: [place] Ose[place]  Procedure(s) Performed: Procedure(s) (LRB): CESAREAN SECTION (N/A)  Patient location during evaluation: Mother Baby Anesthesia Type: Epidural Level of consciousness: awake Pain management: pain level controlled Vital Signs Assessment: post-procedure vital signs reviewed and stable Respiratory status: spontaneous breathing Cardiovascular status: stable Postop Assessment: no headache, no backache, epidural receding and patient able to bend at knees Anesthetic complications: no        Last Vitals:  Vitals:   12/13/16 1158 12/13/16 1300  BP: (!) 110/58 (!) 105/58  Pulse: 94 89  Resp: 16 16  Temp: 36.7 C 36.4 C    Last Pain:  Vitals:   12/13/16 1300  TempSrc: Oral  PainSc: 0-No pain   Pain Goal:                 Edison PaceWILKERSON,Kayley Zeiders

## 2016-12-13 NOTE — Lactation Note (Signed)
This note was copied from a baby's chart. Lactation Consultation Note  Patient Name: Boy [place] Ose[place] Danqu[place] MVHQI'OToday's Date: 12/13/2016 Reason for consult: Initial assessment Breastfeeding consultation services and support information given and reviewed with mom.  This is her third baby and she was unable to breastfeed her first two due to low milk supply and latch difficulties.  Baby is 8 hours old and is currently sleeping skin to skin with mom.  She states baby just finished a good feeding and RN assisted her.  Instructed to feed with any feeding cue and to call for assist prn.  Maternal Data Has patient been taught Hand Expression?: Yes Does the patient have breastfeeding experience prior to this delivery?: No  Feeding    LATCH Score/Interventions                      Lactation Tools Discussed/Used     Consult Status Consult Status: Follow-up Date: 12/14/16 Follow-up type: In-patient    Huston FoleyMOULDEN, Laderius Valbuena S 12/13/2016, 3:41 PM

## 2016-12-13 NOTE — Anesthesia Postprocedure Evaluation (Signed)
Anesthesia Post Note  Patient: Marcia Bryant  Procedure(s) Performed: Procedure(s) (LRB): CESAREAN SECTION (N/A)  Patient location during evaluation: PACU Anesthesia Type: Epidural Level of consciousness: awake, awake and alert and oriented Pain management: pain level controlled Vital Signs Assessment: post-procedure vital signs reviewed and stable Respiratory status: spontaneous breathing, nonlabored ventilation and respiratory function stable Cardiovascular status: stable Postop Assessment: no headache, no backache, epidural receding, patient able to bend at knees and no signs of nausea or vomiting Anesthetic complications: no        Last Vitals:  Vitals:   12/13/16 0951 12/13/16 1100  BP: 129/79 (!) 106/54  Pulse: 99 94  Resp: 18 18  Temp: 37.2 C 36.6 C    Last Pain:  Vitals:   12/13/16 1100  TempSrc: Oral  PainSc:    Pain Goal:                 Cecile HearingStephen Edward Armanie Ullmer

## 2016-12-14 LAB — CBC
HEMATOCRIT: 30.3 % — AB (ref 36.0–46.0)
HEMOGLOBIN: 10.5 g/dL — AB (ref 12.0–15.0)
MCH: 27.3 pg (ref 26.0–34.0)
MCHC: 34.7 g/dL (ref 30.0–36.0)
MCV: 78.9 fL (ref 78.0–100.0)
Platelets: 205 10*3/uL (ref 150–400)
RBC: 3.84 MIL/uL — ABNORMAL LOW (ref 3.87–5.11)
RDW: 14.5 % (ref 11.5–15.5)
WBC: 28.6 10*3/uL — ABNORMAL HIGH (ref 4.0–10.5)

## 2016-12-14 NOTE — Lactation Note (Signed)
This note was copied from a baby's chart. Lactation Consultation Note  Patient Name: Boy [place] Ose[place] Dan[place] ZOXWR'UToday's Date: 12/14/2016 Reason for consult: Follow-up assessment  Baby 29 hrs old.  Mom had breastfed baby 5 times prior to giving formula by bottle X 5 (volumes 15-527ml) .  Mom states she wants to breastfeed, and intends to do both.  Offered assistance with positioning and latching baby.  Undressed him to facilitate STS and help awaken baby.  His last bottle of formula was 2 hrs prior.  Mom has large, heavy breasts, with short erect nipples, areola compressible.  Mom trying to feed baby her nipple by forcing his mouth open.  Offered to assist.  Hand expression done and colostrum easily expressed.  Baby licking and then opened widely.  Positioned baby in football hold on his side and helped him attain a deep areolar grasp to breast.  Mom complaining of uterine cramping while baby was slowly sucking and occasional swallow identified.  Basic BFing reviewed.   Set up DEBP at bedside.  Mom to call for assistance after baby is done at the breast.  Explained that pumping can help to establish a full milk supply due to her history of low milk, and also, she could feed baby her EBM rather than use formula.  FOB very supportive of Mom breastfeeding.    Recommended that Mom call for assistance as needed, and Lactation to follow up prn and in am.   Consult Status Consult Status: Follow-up Date: 12/15/16 Follow-up type: In-patient    Judee ClaraSmith, Yechiel Erny E 12/14/2016, 12:56 PM

## 2016-12-14 NOTE — Anesthesia Postprocedure Evaluation (Signed)
Anesthesia Post Note  Patient: Marcia Bryant  Procedure(s) Performed: Procedure(s) (LRB): CESAREAN SECTION (N/A)  Patient location during evaluation: Mother Baby Anesthesia Type: Epidural Level of consciousness: awake Pain management: satisfactory to patient Vital Signs Assessment: post-procedure vital signs reviewed and stable Respiratory status: spontaneous breathing Cardiovascular status: stable Anesthetic complications: no        Last Vitals:  Vitals:   12/14/16 0042 12/14/16 0530  BP: (!) 104/47 116/73  Pulse: 74 69  Resp: 18 18  Temp: 36.8 C 36.8 C    Last Pain:  Vitals:   12/14/16 0530  TempSrc: Oral  PainSc: 0-No pain   Pain Goal:                 KeyCorpBURGER,Orlandria Kissner

## 2016-12-14 NOTE — Progress Notes (Signed)
UR chart review completed.  

## 2016-12-14 NOTE — Addendum Note (Signed)
Addendum  created 12/14/16 [id number] by Algis GreenhouseLinda A Kashana Breach, CRNA   Sign clinical note

## 2016-12-14 NOTE — Progress Notes (Signed)
Subjective: Postpartum Day 1: Cesarean Delivery Patient reports incisional pain, tolerating PO and no problems voiding.    Objective: Vital signs in last 24 hours: Temp:  [97.6 F (36.4 C)-98.9 F (37.2 C)] 98.2 F (36.8 C) (02/05 0530) Pulse Rate:  [69-108] 69 (02/05 0530) Resp:  [16-34] 18 (02/05 0530) BP: (95-129)/(47-82) 116/73 (02/05 0530) SpO2:  [95 %-100 %] 99 % (02/05 0042)  Physical Exam:  General: alert, cooperative, appears stated age and no distress Lochia: appropriate Uterine Fundus: firm Incision: no significant drainage, no dehiscence, no significant erythema DVT Evaluation: No evidence of DVT seen on physical exam.   Recent Labs  12/12/16 0748 12/14/16 0620  HGB 12.9 10.5*  HCT 36.6 30.3*    Assessment/Plan: Status post Cesarean section. Doing well postoperatively.  Continue current care.  Marcia Bryant 12/14/2016, 6:48 AM

## 2016-12-14 NOTE — Progress Notes (Signed)
Patient claims her pain is a 0. Pain med schedule told. Patient shown how to use incentive spirometry. Patient told to [employer] halls. IV site intact. Plan of care explained. Lisa Leftwhich kirby called to be informed that I had to change the Honeycomb dressing due it when I took abdominal dressing off the right side of honeycomb was torn off. It was 70 percent saturated anyway. Order obtained for dressing change. Sterile technique done.

## 2016-12-15 MED ORDER — SENNOSIDES-DOCUSATE SODIUM 8.6-50 MG PO TABS: 2.0000 | ORAL_TABLET | Freq: Two times a day (BID) | ORAL | 2 refills | Status: DC

## 2016-12-15 MED ORDER — IBUPROFEN 600 MG PO TABS: 600.0000 mg | ORAL_TABLET | Freq: Four times a day (QID) | ORAL | 2 refills | Status: DC | PRN

## 2016-12-15 MED ORDER — OXYCODONE HCL 10 MG PO TABS: 10.0000 mg | ORAL_TABLET | ORAL | 0 refills | Status: DC | PRN

## 2016-12-15 MED ORDER — MEDROXYPROGESTERONE ACETATE 150 MG/ML IM SUSP
150.0000 mg | Freq: Once | INTRAMUSCULAR | Status: AC
  Administered 2016-12-15: 150 mg via INTRAMUSCULAR
  Filled 2016-12-15: qty 1

## 2016-12-15 MED ORDER — FERROUS GLUCONATE 240 (27 FE) MG PO TABS: 240.0000 mg | ORAL_TABLET | Freq: Three times a day (TID) | ORAL | 4 refills | Status: DC

## 2016-12-15 MED ORDER — MEDROXYPROGESTERONE ACETATE 150 MG/ML IM SUSP: 150.0000 mg | INTRAMUSCULAR | 4 refills | Status: DC

## 2016-12-15 NOTE — Discharge Summary (Signed)
OB Discharge Summary     Patient Name: Marcia Bryant DOB: [birth year] MRN: [id number]  Date of admission: 12/12/2016 Delivering MD: Adam PhenixARNOLD, JAMES G   Date of discharge: 12/15/2016  Admitting diagnosis: 40WKS INDUCTION  Intrauterine pregnancy: 4661w1d     Secondary diagnosis:  Active Problems:   Gestational diabetes, diet controlled  Additional problems: Triple I.      Discharge diagnosis: Term Pregnancy Delivered and PLTCS                                                                                                Post partum procedures:none  Augmentation: Pitocin  Complications: Intrauterine Inflammation or infection (Chorioamniotis)  Hospital course:  Induction of Labor With Cesarean Section  31 y.o. yo [phone] at 6861w1d was admitted to the hospital 12/12/2016 for induction of labor. Patient had a labor course significant for Triple I, FTD. The patient went for cesarean section due to Arrest of Dilation and Arrest of Descent, and delivered a Viable infant,@BABYSUPPRESS (DBLINK,ept,110,,1,,) Membrane Rupture Time/Date: )5:10 PM ,12/12/2016   @Details  of operation can be found in separate operative Note.  Patient had an uncomplicated postpartum course. She is ambulating, tolerating a regular diet, passing flatus, and urinating well.  Patient is discharged home in stable condition on 12/15/16.                                    Physical exam  Vitals:   12/14/16 0042 12/14/16 0530 12/14/16 1515 12/14/16 1905  BP: (!) 104/47 116/73 117/67 130/67  Pulse: 74 69 88 80  Resp: 18 18 16 18   Temp: 98.2 F (36.8 C) 98.2 F (36.8 C) 98 F (36.7 C) 98.4 F (36.9 C)  TempSrc: Oral Oral Oral Oral  SpO2: 99%     Weight:      Height:       General: alert, cooperative and no distress Lochia: appropriate Uterine Fundus: firm Incision: Healing well with no significant drainage, No significant erythema, Dressing is clean, dry, and intact DVT Evaluation: No evidence of DVT seen on physical  exam. No cords or calf tenderness. No significant calf/ankle edema. Labs: Lab Results  Component Value Date   WBC 28.6 (H) 12/14/2016   HGB 10.5 (L) 12/14/2016   HCT 30.3 (L) 12/14/2016   MCV 78.9 12/14/2016   PLT 205 12/14/2016   CMP Latest Ref Rng & Units 12/12/2016  Glucose 65 - 99 mg/dL 660(Y217(H)  BUN 6 - 20 mg/dL -  Creatinine 3.010.57 - 6.011.00 mg/dL -  Sodium 093134 - 235144 mmol/L -  Potassium 3.5 - 5.2 mmol/L -  Chloride 96 - 106 mmol/L -  CO2 18 - 29 mmol/L -  Calcium 8.7 - 10.2 mg/dL -  Total Protein 6.0 - 8.5 g/dL -  Total Bilirubin 0.0 - 1.2 mg/dL -  Alkaline Phos 39 - 573117 IU/L -  AST 0 - 40 IU/L -  ALT 0 - 32 IU/L -    Discharge instruction: per After Visit Summary and "Baby and Me Booklet".  After  visit meds:  Allergies as of 12/15/2016   No Known Allergies     Medication List    STOP taking these medications   aspirin EC 81 MG tablet     TAKE these medications   ibuprofen 600 MG tablet Commonly known as:  ADVIL,MOTRIN Take 1 tablet (600 mg total) by mouth every 6 (six) hours as needed for mild pain.   medroxyPROGESTERone 150 MG/ML injection Commonly known as:  DEPO-PROVERA Inject 1 mL (150 mg total) into the muscle every 3 (three) months. Bring to the office with you for administration.   Oxycodone HCl 10 MG Tabs Take 1 tablet (10 mg total) by mouth every 4 (four) hours as needed for severe pain (pain scale > 7).   prenatal multivitamin Tabs tablet Take 1 tablet by mouth daily.   senna-docusate 8.6-50 MG tablet Commonly known as:  Senokot-S Take 2 tablets by mouth 2 (two) times daily.       Diet: routine diet  Activity: Advance as tolerated. Pelvic rest for 6 weeks.   Outpatient follow up:4 weeks Follow up Appt:Future Appointments Date Time Provider Department Center  01/21/2017 8:45 AM Brock Bad, MD [facility] None   Follow up Visit:No Follow-up on file.  Postpartum contraception: Depo Provera  Newborn Data: Live born female  Birth Weight: 8  lb 12.7 oz (3990 g) APGAR: 8, 9  Baby Feeding: Bottle and Breast Disposition:home with mother   12/15/2016 Marcia Bryant, CNM

## 2016-12-15 NOTE — Progress Notes (Signed)
Subjective: Postpartum Day #2: Cesarean Delivery Patient reports incisional pain, tolerating PO, + flatus and no problems voiding.    Objective: Vital signs in last 24 hours: Temp:  [98 F (36.7 C)-98.4 F (36.9 C)] 98.4 F (36.9 C) (02/05 1905) Pulse Rate:  [80-88] 80 (02/05 1905) Resp:  [16-18] 18 (02/05 1905) BP: (117-130)/(67) 130/67 (02/05 1905)  Physical Exam:  General: alert, cooperative and no distress Lochia: appropriate Uterine Fundus: firm Incision: no significant drainage, no dehiscence, no significant erythema DVT Evaluation: No evidence of DVT seen on physical exam. No cords or calf tenderness. No significant calf/ankle edema.   Recent Labs  12/12/16 0748 12/14/16 0620  HGB 12.9 10.5*  HCT 36.6 30.3*    Assessment/Plan: Status post Cesarean section. Doing well postoperatively.  Discharge home with standard precautions and return to clinic in 4-6 weeks.  Roe Coombsachelle A Denney, CNM 12/15/2016, 7:13 AM

## 2017-01-21 ENCOUNTER — Encounter: Payer: Self-pay | Admitting: *Deleted

## 2017-01-21 ENCOUNTER — Encounter: Payer: Self-pay | Admitting: Obstetrics

## 2017-01-21 ENCOUNTER — Ambulatory Visit (INDEPENDENT_AMBULATORY_CARE_PROVIDER_SITE_OTHER): Payer: Medicaid Other | Admitting: Obstetrics

## 2017-01-21 NOTE — Progress Notes (Signed)
Post Partum Exam  Marcia Bryant is a 31 y.o. [phone] female who presents for a postpartum visit. She is 6 weeks postpartum following a low transverse Cesarean section. I have fully reviewed the prenatal and intrapartum course. The delivery was at 136w1d gestational weeks.  Anesthesia: epidural. Postpartum course has been unremarkable. Baby's course has been unremarkable. Baby is feeding by both breast and bottle - Similac Advance. Bleeding no bleeding. Bowel function is normal. Bladder function is normal. Patient is not sexually active. Contraception method is Depo-Provera injections. Postpartum depression screening:neg EPDS 6  The following portions of the patient's history were reviewed and updated as appropriate: allergies, current medications, past family history, past medical history, past social history, past surgical history and problem list.  Review of Systems A comprehensive review of systems was negative.    Objective:  Blood pressure (!) 139/91, pulse 73, height 5\' 6"  (1.676 m), weight 195 lb (88.5 kg), currently breastfeeding.  General:  alert and no distress   Breasts:  inspection negative, no nipple discharge or bleeding, no masses or nodularity palpable  Lungs: clear to auscultation bilaterally  Heart:  regular rate and rhythm, S1, S2 normal, no murmur, click, rub or gallop  Abdomen: soft, non-tender; bowel sounds normal; no masses,  no organomegaly   Vulva:  normal  Vagina: normal vagina  Cervix:  no cervical motion tenderness  Corpus: normal size, contour, position, consistency, mobility, non-tender  Adnexa:  no mass, fullness, tenderness  Rectal Exam: Not performed.        Assessment:    Normal postpartum exam. Pap smear not done at today's visit.   Patient still sore and not mentally ready to return to work   Plan:   1. Contraception: Depo-Provera injections 2.  3 weeks additional maternity leave granted to continue healing from cesarean section 3. Follow up  in: 4 months or as needed.

## 2017-03-17 ENCOUNTER — Ambulatory Visit: Payer: Medicaid Other

## 2017-03-19 ENCOUNTER — Ambulatory Visit (INDEPENDENT_AMBULATORY_CARE_PROVIDER_SITE_OTHER): Payer: Medicaid Other

## 2017-03-19 DIAGNOSIS — Z30013 Encounter for initial prescription of injectable contraceptive: Secondary | ICD-10-CM

## 2017-03-19 DIAGNOSIS — Z308 Encounter for other contraceptive management: Secondary | ICD-10-CM

## 2017-03-19 LAB — POCT URINE PREGNANCY: Preg Test, Ur: NEGATIVE

## 2017-03-19 NOTE — Progress Notes (Signed)
Patient is in the office for UPT to start Depo, results negative and pt states that she has been sexually active. Advised patient to come back in 2 weeks for 2nd UPT and Depo administration.

## 2017-04-02 ENCOUNTER — Ambulatory Visit (INDEPENDENT_AMBULATORY_CARE_PROVIDER_SITE_OTHER): Payer: Medicaid Other

## 2017-04-02 DIAGNOSIS — Z308 Encounter for other contraceptive management: Secondary | ICD-10-CM | POA: Diagnosis not present

## 2017-04-02 LAB — POCT URINE PREGNANCY: Preg Test, Ur: NEGATIVE

## 2017-04-02 MED ORDER — MEDROXYPROGESTERONE ACETATE 150 MG/ML IM SUSP: 150.0000 mg | Freq: Once | INTRAMUSCULAR | Status: DC

## 2017-04-02 MED ORDER — MEDROXYPROGESTERONE ACETATE 150 MG/ML IM SUSP
150.0000 mg | INTRAMUSCULAR | Status: DC
Start: 2017-04-02 — End: 2020-07-01
  Administered 2017-04-02: 150 mg via INTRAMUSCULAR

## 2017-04-02 NOTE — Progress Notes (Signed)
Patient is in the office for 2nd UPT/depo start. UPT is negative, administered injection and pt tolerated well. .. Administrations This Visit    medroxyPROGESTERone (DEPO-PROVERA) injection 150 mg    Admin Date 04/02/2017 Action Given Dose 150 mg Route Intramuscular Administered By Katrina StackStalling, Dannya Pitkin D, RN

## 2017-04-22 ENCOUNTER — Ambulatory Visit: Payer: Medicaid Other

## 2017-06-24 ENCOUNTER — Ambulatory Visit: Payer: Medicaid Other

## 2017-12-10 ENCOUNTER — Telehealth: Payer: Self-pay | Admitting: *Deleted

## 2017-12-10 ENCOUNTER — Ambulatory Visit (INDEPENDENT_AMBULATORY_CARE_PROVIDER_SITE_OTHER): Payer: 59 | Admitting: Obstetrics

## 2017-12-10 ENCOUNTER — Encounter: Payer: Self-pay | Admitting: Obstetrics

## 2017-12-10 VITALS — BP 124/83 | HR 72 | Wt 197.6 lb

## 2017-12-10 DIAGNOSIS — Z30013 Encounter for initial prescription of injectable contraceptive: Secondary | ICD-10-CM | POA: Diagnosis not present

## 2017-12-10 DIAGNOSIS — Z3009 Encounter for other general counseling and advice on contraception: Secondary | ICD-10-CM | POA: Diagnosis not present

## 2017-12-10 LAB — POCT URINE PREGNANCY: PREG TEST UR: NEGATIVE

## 2017-12-10 MED ORDER — MEDROXYPROGESTERONE ACETATE 150 MG/ML IM SUSP: 150.0000 mg | INTRAMUSCULAR | 4 refills | Status: DC

## 2017-12-10 NOTE — Progress Notes (Signed)
Subjective:    Marcia Bryant is a 32 y.o. female who presents for contraception counseling. The patient has no complaints today. The patient is sexually active. Pertinent past medical history: none.  The information documented in the HPI was reviewed and verified.  Menstrual History: OB History    Gravida Para Term Preterm AB Living   4 3 3   1 3    SAB TAB Ectopic Multiple Live Births   1     0 3       Patient's last menstrual period was 11/18/2017 (exact date).   Patient Active Problem List   Diagnosis Date Noted  . Gestational diabetes, diet controlled 12/12/2016  . Gestational diabetes mellitus (GDM), antepartum 06/15/2016  . failed early 1h (154) 05/25/2016  . Supervision of high-risk pregnancy 05/20/2016  . BMI 33.0-33.9,adult 05/20/2016  . Obesity in pregnancy 05/20/2016  . History of pre-eclampsia 05/20/2016  . History of shoulder dystocia in prior pregnancy 05/20/2016  . Umbilical hernia 05/20/2016   Past Medical History:  Diagnosis Date  . Bacterial vaginosis   . Diabetes mellitus without complication (HCC)   . Gestational diabetes   . Preeclampsia 01/25/2013  . Umbilical hernia 05/20/2016  . Yeast infection     Past Surgical History:  Procedure Laterality Date  . CESAREAN SECTION N/A 12/13/2016   Procedure: CESAREAN SECTION;  Surgeon: Adam Phenix, MD;  Location: [facility] BIRTHING SUITES;  Service: Obstetrics;  Laterality: N/A;     Current Outpatient Medications:  .  ferrous gluconate (IRON 27) 240 (27 FE) MG tablet, Take 1 tablet (240 mg total) by mouth 3 (three) times daily with meals. (Patient not taking: Reported on 03/19/2017), Disp: 200 tablet, Rfl: 4 .  ibuprofen (ADVIL,MOTRIN) 600 MG tablet, Take 1 tablet (600 mg total) by mouth every 6 (six) hours as needed for mild pain. (Patient not taking: Reported on 03/19/2017), Disp: 120 tablet, Rfl: 2 .  medroxyPROGESTERone (DEPO-PROVERA) 150 MG/ML injection, Inject 1 mL (150 mg total) into the muscle every 3  (three) months. Bring to the office with you for administration., Disp: 1 mL, Rfl: 4 .  oxyCODONE 10 MG TABS, Take 1 tablet (10 mg total) by mouth every 4 (four) hours as needed for severe pain (pain scale > 7). (Patient not taking: Reported on 01/21/2017), Disp: 45 tablet, Rfl: 0 .  Prenatal Vit-Fe Fumarate-FA (PRENATAL MULTIVITAMIN) TABS tablet, Take 1 tablet by mouth daily. , Disp: , Rfl:  .  senna-docusate (SENOKOT-S) 8.6-50 MG tablet, Take 2 tablets by mouth 2 (two) times daily. (Patient not taking: Reported on 01/21/2017), Disp: 200 tablet, Rfl: 2  Current Facility-Administered Medications:  .  medroxyPROGESTERone (DEPO-PROVERA) injection 150 mg, 150 mg, Intramuscular, Q90 days, Brock Bad, MD, 150 mg at 04/02/17 1035 No Known Allergies  Social History   Tobacco Use  . Smoking status: Never Smoker  . Smokeless tobacco: Never Used  Substance Use Topics  . Alcohol use: No    Family History  Problem Relation Age of Onset  . Hypertension Father   . Hypertension Paternal Grandmother        Review of Systems Constitutional: negative for weight loss Genitourinary:negative for abnormal menstrual periods and vaginal discharge   Objective:   BP 124/83   Pulse 72   Wt 197 lb 9.6 oz (89.6 kg)   LMP 11/18/2017 (Exact Date)   Breastfeeding? No   BMI 31.89 kg/m    General:   alert  Skin:   no rash or abnormalities  Lungs:  clear to auscultation bilaterally  Heart:   regular rate and rhythm, S1, S2 normal, no murmur, click, rub or gallop  Breasts:   normal without suspicious masses, skin or nipple changes or axillary nodes  Abdomen:  normal findings: no organomegaly, soft, non-tender and no hernia  Pelvis:  External genitalia: normal general appearance Urinary system: urethral meatus normal and bladder without fullness, nontender Vaginal: normal without tenderness, induration or masses Cervix: normal appearance Adnexa: normal bimanual exam Uterus: anteverted and  non-tender, normal size   Lab Review Urine pregnancy test Labs reviewed yes Radiologic studies reviewed no  >50% of 10 min visit spent on counseling and coordination of care.    Assessment:    32 y.o., starting Depo-Provera injections, no contraindications.   Plan:    All questions answered. Contraception: Depo-Provera injections. Discussed healthy lifestyle modifications. Follow up in 2 weeks. Pregnancy test, result: negative.    Meds ordered this encounter  Medications  . medroxyPROGESTERone (DEPO-PROVERA) 150 MG/ML injection    Sig: Inject 1 mL (150 mg total) into the muscle every 3 (three) months. Bring to the office with you for administration.    Dispense:  1 mL    Refill:  4   Orders Placed This Encounter  Procedures  . POCT urine pregnancy    Brock BadHARLES A. Taneesha Edgin MD

## 2017-12-10 NOTE — Telephone Encounter (Signed)
ERROR

## 2017-12-10 NOTE — Progress Notes (Signed)
RGYN patient presents for consult to discuss birth control options Per notes pt is considering Depo.  Patient  Admits to Unprotected intercourse x 14 days.

## 2017-12-24 ENCOUNTER — Ambulatory Visit (INDEPENDENT_AMBULATORY_CARE_PROVIDER_SITE_OTHER): Payer: 59

## 2017-12-24 DIAGNOSIS — Z3042 Encounter for surveillance of injectable contraceptive: Secondary | ICD-10-CM | POA: Diagnosis not present

## 2017-12-24 DIAGNOSIS — Z3202 Encounter for pregnancy test, result negative: Secondary | ICD-10-CM

## 2017-12-24 LAB — POCT URINE PREGNANCY: Preg Test, Ur: NEGATIVE

## 2017-12-24 MED ORDER — MEDROXYPROGESTERONE ACETATE 150 MG/ML IM SUSP
150.0000 mg | Freq: Once | INTRAMUSCULAR | Status: AC
  Administered 2017-12-24: 150 mg via INTRAMUSCULAR

## 2017-12-24 NOTE — Progress Notes (Signed)
Chart reviewed for nurse visit. Agree with plan of care.   Rolm Bookbindereill, Caroline M, [place] 12/24/2017 9:46 AM

## 2017-12-24 NOTE — Progress Notes (Signed)
Pt presents for Depo injection restart. 2nd UPT neg today. Pt denies IC x 14 days. Depo given R upper outer quad w/o complaints. Pt supplied Depo. Next Depo due May 3-May 17, pt agrees.

## 2018-03-14 ENCOUNTER — Ambulatory Visit: Payer: 59

## 2018-03-15 ENCOUNTER — Encounter: Payer: Self-pay | Admitting: Obstetrics

## 2018-03-15 ENCOUNTER — Ambulatory Visit (INDEPENDENT_AMBULATORY_CARE_PROVIDER_SITE_OTHER): Payer: 59 | Admitting: Obstetrics

## 2018-03-15 VITALS — BP 136/92 | HR 74 | Wt 196.6 lb

## 2018-03-15 DIAGNOSIS — Z3009 Encounter for other general counseling and advice on contraception: Secondary | ICD-10-CM | POA: Diagnosis not present

## 2018-03-15 DIAGNOSIS — Z3042 Encounter for surveillance of injectable contraceptive: Secondary | ICD-10-CM

## 2018-03-15 DIAGNOSIS — Z30011 Encounter for initial prescription of contraceptive pills: Secondary | ICD-10-CM

## 2018-03-15 MED ORDER — LEVONORGEST-ETH ESTRADIOL-IRON 0.1-20 MG-MCG(21) PO TABS: 1.0000 | ORAL_TABLET | Freq: Every day | ORAL | 11 refills | Status: DC

## 2018-03-15 NOTE — Progress Notes (Signed)
Subjective:    Marcia Bryant is a 32 y.o. female who presents for contraception counseling. The patient has had irregular cycles with Depo Provera. The patient is sexually active. Pertinent past medical history: none.  The information documented in the HPI was reviewed and verified.  Menstrual History: OB History    Gravida  4   Para  3   Term  3   Preterm      AB  1   Living  3     SAB  1   TAB      Ectopic      Multiple  0   Live Births  3           No LMP recorded.   Patient Active Problem List   Diagnosis Date Noted  . Gestational diabetes, diet controlled 12/12/2016  . Gestational diabetes mellitus (GDM), antepartum 06/15/2016  . failed early 1h (154) 05/25/2016  . Supervision of high-risk pregnancy 05/20/2016  . BMI 33.0-33.9,adult 05/20/2016  . Obesity in pregnancy 05/20/2016  . History of pre-eclampsia 05/20/2016  . History of shoulder dystocia in prior pregnancy 05/20/2016  . Umbilical hernia 05/20/2016   Past Medical History:  Diagnosis Date  . Bacterial vaginosis   . Diabetes mellitus without complication (HCC)   . Gestational diabetes   . Preeclampsia 01/25/2013  . Umbilical hernia 05/20/2016  . Yeast infection     Past Surgical History:  Procedure Laterality Date  . CESAREAN SECTION N/A 12/13/2016   Procedure: CESAREAN SECTION;  Surgeon: Adam Phenix, MD;  Location: [facility] BIRTHING SUITES;  Service: Obstetrics;  Laterality: N/A;     Current Outpatient Medications:  .  medroxyPROGESTERone (DEPO-PROVERA) 150 MG/ML injection, Inject 1 mL (150 mg total) into the muscle every 3 (three) months. Bring to the office with you for administration., Disp: 1 mL, Rfl: 4 .  Prenatal Vit-Fe Fumarate-FA (PRENATAL MULTIVITAMIN) TABS tablet, Take 1 tablet by mouth daily. , Disp: , Rfl:  .  ferrous gluconate (IRON 27) 240 (27 FE) MG tablet, Take 1 tablet (240 mg total) by mouth 3 (three) times daily with meals. (Patient not taking: Reported on 03/19/2017),  Disp: 200 tablet, Rfl: 4 .  ibuprofen (ADVIL,MOTRIN) 600 MG tablet, Take 1 tablet (600 mg total) by mouth every 6 (six) hours as needed for mild pain. (Patient not taking: Reported on 03/19/2017), Disp: 120 tablet, Rfl: 2 .  Levonorgest-Eth Estrad-Fe Bisg (BALCOLTRA) 0.1-20 MG-MCG(21) TABS, Take 1 tablet by mouth daily., Disp: 28 tablet, Rfl: 11 .  oxyCODONE 10 MG TABS, Take 1 tablet (10 mg total) by mouth every 4 (four) hours as needed for severe pain (pain scale > 7). (Patient not taking: Reported on 01/21/2017), Disp: 45 tablet, Rfl: 0 .  senna-docusate (SENOKOT-S) 8.6-50 MG tablet, Take 2 tablets by mouth 2 (two) times daily. (Patient not taking: Reported on 01/21/2017), Disp: 200 tablet, Rfl: 2  Current Facility-Administered Medications:  .  medroxyPROGESTERone (DEPO-PROVERA) injection 150 mg, 150 mg, Intramuscular, Q90 days, Brock Bad, MD, 150 mg at 04/02/17 1035 No Known Allergies  Social History   Tobacco Use  . Smoking status: Never Smoker  . Smokeless tobacco: Never Used  Substance Use Topics  . Alcohol use: No    Family History  Problem Relation Age of Onset  . Hypertension Father   . Hypertension Paternal Grandmother        Review of Systems Constitutional: negative for weight loss Genitourinary:negative for abnormal menstrual periods and vaginal discharge   Objective:  BP (!) 136/92   Pulse 74   Wt 196 lb 9.6 oz (89.2 kg)   BMI 31.73 kg/m    PE:  Deferred   Lab Review Urine pregnancy test Labs reviewed yes Radiologic studies reviewed no  >50% of 15 min visit spent on counseling and coordination of care.    Assessment:    32 y.o., discontinuing Depo-Provera injections and starting OCP''s, no contraindications.   Plan:    All questions answered. Follow up in 3 months.    Meds ordered this encounter  Medications  . Levonorgest-Eth Estrad-Fe Bisg (BALCOLTRA) 0.1-20 MG-MCG(21) TABS    Sig: Take 1 tablet by mouth daily.    Dispense:  28 tablet     Refill:  11   No orders of the defined types were placed in this encounter.   Brock Bad MD 03-15-2018

## 2018-03-15 NOTE — Progress Notes (Signed)
Pt here to dicuss other Birth Control options pt states she does not want to continue Depo. Pt has noticed weight gain, abnormal bleeding and hair loss  Last Depo Injection was 12/24/2017  Next depo is scheduled for May 3rd - May 17th .

## 2018-08-09 ENCOUNTER — Other Ambulatory Visit: Payer: Self-pay | Admitting: Obstetrics

## 2018-08-09 DIAGNOSIS — Z30011 Encounter for initial prescription of contraceptive pills: Secondary | ICD-10-CM

## 2018-08-09 MED ORDER — LEVONORGESTREL-ETHINYL ESTRAD 0.15-30 MG-MCG PO TABS: 1.0000 | ORAL_TABLET | Freq: Every day | ORAL | 11 refills | Status: DC

## 2018-08-11 ENCOUNTER — Ambulatory Visit: Payer: 59 | Admitting: Obstetrics

## 2018-08-23 ENCOUNTER — Ambulatory Visit: Payer: 59 | Admitting: Obstetrics

## 2018-09-05 ENCOUNTER — Encounter: Payer: Self-pay | Admitting: Obstetrics

## 2018-09-05 ENCOUNTER — Ambulatory Visit (INDEPENDENT_AMBULATORY_CARE_PROVIDER_SITE_OTHER): Payer: 59 | Admitting: Obstetrics

## 2018-09-05 VITALS — Ht 66.0 in | Wt 191.0 lb

## 2018-09-05 DIAGNOSIS — Z113 Encounter for screening for infections with a predominantly sexual mode of transmission: Secondary | ICD-10-CM

## 2018-09-05 DIAGNOSIS — Z1151 Encounter for screening for human papillomavirus (HPV): Secondary | ICD-10-CM | POA: Diagnosis not present

## 2018-09-05 DIAGNOSIS — M546 Pain in thoracic spine: Secondary | ICD-10-CM

## 2018-09-05 DIAGNOSIS — Z3041 Encounter for surveillance of contraceptive pills: Secondary | ICD-10-CM

## 2018-09-05 DIAGNOSIS — Z0000 Encounter for general adult medical examination without abnormal findings: Secondary | ICD-10-CM

## 2018-09-05 DIAGNOSIS — N898 Other specified noninflammatory disorders of vagina: Secondary | ICD-10-CM

## 2018-09-05 DIAGNOSIS — L814 Other melanin hyperpigmentation: Secondary | ICD-10-CM

## 2018-09-05 DIAGNOSIS — G8929 Other chronic pain: Secondary | ICD-10-CM

## 2018-09-05 DIAGNOSIS — M25511 Pain in right shoulder: Secondary | ICD-10-CM

## 2018-09-05 DIAGNOSIS — Z01419 Encounter for gynecological examination (general) (routine) without abnormal findings: Secondary | ICD-10-CM

## 2018-09-05 DIAGNOSIS — M25512 Pain in left shoulder: Secondary | ICD-10-CM

## 2018-09-05 DIAGNOSIS — Z124 Encounter for screening for malignant neoplasm of cervix: Secondary | ICD-10-CM | POA: Diagnosis not present

## 2018-09-05 DIAGNOSIS — N62 Hypertrophy of breast: Secondary | ICD-10-CM

## 2018-09-05 NOTE — Progress Notes (Signed)
Patient presents for Annual Exam today.  LMP: 08/30/18 Last pap:05/20/2016 WNL  Contraception:Pills  STD Screening: Declines   CC: NONE

## 2018-09-05 NOTE — Progress Notes (Signed)
Subjective:        Marcia Bryant is a 32 y.o. female here for a routine exam.  Current complaints: Backache and shoulder pain from large and heavy breasts.  Has tried various sports bras for relief without success.  The pain has worsened over the past year.  Also has noticed a discoloration of the skin on the inner right thigh.    Personal health questionnaire:  Is patient Ashkenazi Jewish, have a family history of breast and/or ovarian cancer: no Is there a family history of uterine cancer diagnosed at age < 74, gastrointestinal cancer, urinary tract cancer, family member who is a Personnel officer syndrome-associated carrier: no Is the patient overweight and hypertensive, family history of diabetes, personal history of gestational diabetes, preeclampsia or PCOS: no Is patient over 59, have PCOS,  family history of premature CHD under age 33, diabetes, smoke, have hypertension or peripheral artery disease:  no At any time, has a partner hit, kicked or otherwise hurt or frightened you?: no Over the past 2 weeks, have you felt down, depressed or hopeless?: no Over the past 2 weeks, have you felt little interest or pleasure in doing things?:no   Gynecologic History Patient's last menstrual period was 08/30/2018 (exact date). Contraception: OCP (estrogen/progesterone) Last Pap: 2017. Results were: normal Last mammogram: n/a. Results were: n/a  Obstetric History OB History  Gravida Para Term Preterm AB Living  4 3 3   1 3   SAB TAB Ectopic Multiple Live Births  1     0 3    # Outcome Date GA Lbr Len/2nd Weight Sex Delivery Anes PTL Lv  4 Term 12/13/16 [redacted]w[redacted]d  8 lb 12.7 oz (3.99 kg) M CS-LVertical EPI  LIV  3 Term 01/26/13 [redacted]w[redacted]d 10:04 / 00:34 8 lb 9.8 oz (3.907 kg) M Vag-Spont EPI  LIV  2 SAB 01/2012 [redacted]w[redacted]d            Birth Comments: Passed naturally;no complications  1 Term 03/21/11 [redacted]w[redacted]d  6 lb (2.722 kg) M Vag-Spont EPI  LIV     Birth Comments: No complications    Past Medical History:   Diagnosis Date  . Bacterial vaginosis   . Diabetes mellitus without complication (HCC)   . Gestational diabetes   . Preeclampsia 01/25/2013  . Umbilical hernia 05/20/2016  . Yeast infection     Past Surgical History:  Procedure Laterality Date  . CESAREAN SECTION N/A 12/13/2016   Procedure: CESAREAN SECTION;  Surgeon: Adam Phenix, MD;  Location: [facility];  Service: Obstetrics;  Laterality: N/A;     Current Outpatient Medications:  .  levonorgestrel-ethinyl estradiol (NORDETTE) 0.15-30 MG-MCG tablet, Take 1 tablet by mouth daily., Disp: 1 Package, Rfl: 11 .  ferrous gluconate (IRON 27) 240 (27 FE) MG tablet, Take 1 tablet (240 mg total) by mouth 3 (three) times daily with meals. (Patient not taking: Reported on 03/19/2017), Disp: 200 tablet, Rfl: 4 .  ibuprofen (ADVIL,MOTRIN) 600 MG tablet, Take 1 tablet (600 mg total) by mouth every 6 (six) hours as needed for mild pain. (Patient not taking: Reported on 03/19/2017), Disp: 120 tablet, Rfl: 2 .  Levonorgest-Eth Estrad-Fe Bisg (BALCOLTRA) 0.1-20 MG-MCG(21) TABS, Take 1 tablet by mouth daily. (Patient not taking: Reported on 09/05/2018), Disp: 28 tablet, Rfl: 11 .  medroxyPROGESTERone (DEPO-PROVERA) 150 MG/ML injection, Inject 1 mL (150 mg total) into the muscle every 3 (three) months. Bring to the office with you for administration. (Patient not taking: Reported on 09/05/2018), Disp: 1 mL,  Rfl: 4 .  oxyCODONE 10 MG TABS, Take 1 tablet (10 mg total) by mouth every 4 (four) hours as needed for severe pain (pain scale > 7). (Patient not taking: Reported on 01/21/2017), Disp: 45 tablet, Rfl: 0 .  Prenatal Vit-Fe Fumarate-FA (PRENATAL MULTIVITAMIN) TABS tablet, Take 1 tablet by mouth daily. , Disp: , Rfl:  .  senna-docusate (SENOKOT-S) 8.6-50 MG tablet, Take 2 tablets by mouth 2 (two) times daily. (Patient not taking: Reported on 01/21/2017), Disp: 200 tablet, Rfl: 2  Current Facility-Administered Medications:  .  medroxyPROGESTERone  (DEPO-PROVERA) injection 150 mg, 150 mg, Intramuscular, Q90 days, Brock Bad, MD, 150 mg at 04/02/17 1035 No Known Allergies  Social History   Tobacco Use  . Smoking status: Never Smoker  . Smokeless tobacco: Never Used  Substance Use Topics  . Alcohol use: No    Family History  Problem Relation Age of Onset  . Hypertension Father   . Hypertension Paternal Grandmother       Review of Systems  Constitutional: negative for fatigue and weight loss Respiratory: negative for cough and wheezing Cardiovascular: negative for chest pain, fatigue and palpitations Gastrointestinal: negative for abdominal pain and change in bowel habits Musculoskeletal:negative for myalgias Neurological: negative for gait problems and tremors Behavioral/Psych: negative for abusive relationship, depression Endocrine: negative for temperature intolerance    Genitourinary:negative for abnormal menstrual periods, genital lesions, hot flashes, sexual problems and vaginal discharge Integument/breast: negative for breast lump, breast tenderness, nipple discharge and skin lesion(s)    Objective:       Ht 5\' 6"  (1.676 m)   Wt 191 lb (86.6 kg)   LMP 08/30/2018 (Exact Date)   Breastfeeding? No   BMI 30.83 kg/m  General:   alert  Skin:   no rash or abnormalities  Lungs:   clear to auscultation bilaterally  Heart:   regular rate and rhythm, S1, S2 normal, no murmur, click, rub or gallop  Breasts:   normal without suspicious masses, skin or nipple changes or axillary nodes  Abdomen:  normal findings: no organomegaly, soft, non-tender and no hernia  Pelvis:  External genitalia: normal general appearance Urinary system: urethral meatus normal and bladder without fullness, nontender Vaginal: normal without tenderness, induration or masses Cervix: normal appearance Adnexa: normal bimanual exam Uterus: anteverted and non-tender, normal size   Lab Review Urine pregnancy test Labs reviewed  yes Radiologic studies reviewed no  50% of 20 min visit spent on counseling and coordination of care.   Assessment:      1. Encounter for annual routine gynecological examination  2. Vaginal discharge Rx: - Cervicovaginal ancillary only  3. Screening for cervical cancer Rx: - Cytology - PAP  4. Encounter for surveillance of contraceptive pills - doing well  5. Melanosis Rx: - Ambulatory referral to Dermatology  6. Large breasts Rx: - Ambulatory referral to Plastic Surgery  7. Chronic pain of both shoulders Rx: - Ambulatory referral to Plastic Surgery  8. Chronic midline thoracic back pain Rx: - Ambulatory referral to Plastic Surgery  9. Routine adult health maintenance Rx: - Ambulatory referral to Internal Medicine   Plan:    Education reviewed: calcium supplements, depression evaluation, low fat, low cholesterol diet, safe sex/STD prevention, self breast exams and weight bearing exercise. Contraception: OCP (estrogen/progesterone). Follow up in: 1 year.   No orders of the defined types were placed in this encounter.  Orders Placed This Encounter  Procedures  . Ambulatory referral to Dermatology    Referral Priority:  Routine    Referral Type:   Consultation    Referral Reason:   Specialty Services Required    Requested Specialty:   Dermatology    Number of Visits Requested:   1  . Ambulatory referral to Internal Medicine    Referral Priority:   Routine    Referral Type:   Consultation    Referral Reason:   Specialty Services Required    Requested Specialty:   Internal Medicine    Number of Visits Requested:   1  . Ambulatory referral to Plastic Surgery    Referral Priority:   Routine    Referral Type:   Surgical    Referral Reason:   Specialty Services Required    Requested Specialty:   Plastic Surgery    Number of Visits Requested:   1    Brock Bad MD 09-05-2018

## 2018-09-06 LAB — CERVICOVAGINAL ANCILLARY ONLY
BACTERIAL VAGINITIS: NEGATIVE
CHLAMYDIA, DNA PROBE: NEGATIVE
NEISSERIA GONORRHEA: NEGATIVE
Trichomonas: NEGATIVE

## 2018-09-08 LAB — CYTOLOGY - PAP
DIAGNOSIS: NEGATIVE
HPV (WINDOPATH): NOT DETECTED

## 2018-09-14 ENCOUNTER — Ambulatory Visit: Payer: 59 | Admitting: Family Medicine

## 2018-09-20 ENCOUNTER — Other Ambulatory Visit: Payer: Self-pay | Admitting: Plastic Surgery

## 2018-09-20 ENCOUNTER — Ambulatory Visit (INDEPENDENT_AMBULATORY_CARE_PROVIDER_SITE_OTHER): Payer: 59 | Admitting: Plastic Surgery

## 2018-09-20 ENCOUNTER — Encounter: Payer: Self-pay | Admitting: Physician Assistant

## 2018-09-20 ENCOUNTER — Encounter: Payer: Self-pay | Admitting: Plastic Surgery

## 2018-09-20 VITALS — BP 120/68 | HR 77 | Resp 16 | Ht 66.0 in | Wt 194.0 lb

## 2018-09-20 DIAGNOSIS — M542 Cervicalgia: Secondary | ICD-10-CM

## 2018-09-20 DIAGNOSIS — N62 Hypertrophy of breast: Secondary | ICD-10-CM | POA: Diagnosis not present

## 2018-09-20 DIAGNOSIS — G8929 Other chronic pain: Secondary | ICD-10-CM | POA: Insufficient documentation

## 2018-09-20 DIAGNOSIS — M546 Pain in thoracic spine: Secondary | ICD-10-CM | POA: Diagnosis not present

## 2018-09-20 NOTE — Progress Notes (Signed)
Patient ID: Marcia Bryant, female    DOB: 06-Oct-1986, 32 y.o.   MRN: [id number]   Chief Complaint  Patient presents with  . Breast Problem    Mammary Hyperplasia: The patient is a 32 y.o. female with a history of mammary hyperplasia for several years.  She has extremely large breasts causing symptoms that include the following: Back pain (upper and lower) and neck pain. She frequently pins bra cups higher on straps for better lift and relief. Notices relief when holding breast up in her hands. Shoulder straps causing grooves, pain occasionally requiring padding. Pain medication is sometimes required with motrin and tylenol.  Activities that are hindered by enlarged breasts include: running and exercise.  Her breasts are extremely large and fairly symmetric.  She has hyperpigmentation of the inframammary area on both sides.  The sternal to nipple distance on the right is 39 cm and the left is 36 cm.  The IMF distance is 20 cm.  She is 5 feet 6 inches tall and weighs 194 pounds.  Preoperative bra size = 38 DD cup.  The estimated excess breast tissue to be removed at the time of surgery = 650 grams on the left and 650 grams on the right.  Mammogram history: last week and was negative. We need to have the report sent to [place]  She has three sons and was not able to breast feed.  She may want to breast feed in the future.   Review of Systems  Constitutional: Positive for activity change. Negative for appetite change.  HENT: Negative.  Negative for facial swelling.   Eyes: Negative.  Negative for discharge.  Respiratory: Negative.  Negative for shortness of breath.   Cardiovascular: Negative.  Negative for leg swelling.  Gastrointestinal: Negative.   Endocrine: Negative.  Negative for cold intolerance.  Genitourinary: Negative.   Musculoskeletal: Positive for back pain and neck pain.  Skin: Negative.  Negative for color change and wound.  Neurological: Negative.   Hematological:  Negative.   Psychiatric/Behavioral: Negative.     Past Medical History:  Diagnosis Date  . Bacterial vaginosis   . Diabetes mellitus without complication (HCC)   . Gestational diabetes   . Preeclampsia 01/25/2013  . Umbilical hernia 05/20/2016  . Yeast infection     Past Surgical History:  Procedure Laterality Date  . CESAREAN SECTION N/A 12/13/2016   Procedure: CESAREAN SECTION;  Surgeon: Adam Phenix, MD;  Location: [facility];  Service: Obstetrics;  Laterality: N/A;      Current Outpatient Medications:  .  ferrous gluconate (IRON 27) 240 (27 FE) MG tablet, Take 1 tablet (240 mg total) by mouth 3 (three) times daily with meals. (Patient not taking: Reported on 03/19/2017), Disp: 200 tablet, Rfl: 4 .  ibuprofen (ADVIL,MOTRIN) 600 MG tablet, Take 1 tablet (600 mg total) by mouth every 6 (six) hours as needed for mild pain. (Patient not taking: Reported on 03/19/2017), Disp: 120 tablet, Rfl: 2 .  Levonorgest-Eth Estrad-Fe Bisg (BALCOLTRA) 0.1-20 MG-MCG(21) TABS, Take 1 tablet by mouth daily. (Patient not taking: Reported on 09/05/2018), Disp: 28 tablet, Rfl: 11 .  levonorgestrel-ethinyl estradiol (NORDETTE) 0.15-30 MG-MCG tablet, Take 1 tablet by mouth daily., Disp: 1 Package, Rfl: 11 .  medroxyPROGESTERone (DEPO-PROVERA) 150 MG/ML injection, Inject 1 mL (150 mg total) into the muscle every 3 (three) months. Bring to the office with you for administration. (Patient not taking: Reported on 09/05/2018), Disp: 1 mL, Rfl: 4 .  oxyCODONE 10 MG TABS,  Take 1 tablet (10 mg total) by mouth every 4 (four) hours as needed for severe pain (pain scale > 7). (Patient not taking: Reported on 01/21/2017), Disp: 45 tablet, Rfl: 0 .  Prenatal Vit-Fe Fumarate-FA (PRENATAL MULTIVITAMIN) TABS tablet, Take 1 tablet by mouth daily. , Disp: , Rfl:  .  senna-docusate (SENOKOT-S) 8.6-50 MG tablet, Take 2 tablets by mouth 2 (two) times daily. (Patient not taking: Reported on 01/21/2017), Disp: 200 tablet, Rfl:  2  Current Facility-Administered Medications:  .  medroxyPROGESTERone (DEPO-PROVERA) injection 150 mg, 150 mg, Intramuscular, Q90 days, Brock BadHarper, Charles A, MD, 150 mg at 04/02/17 1035   Objective:   Vitals:   09/20/18 1010  BP: 120/68  Pulse: 77  Resp: 16  SpO2: 98%    Physical Exam  Constitutional: She appears well-developed and well-nourished.  HENT:  Head: Normocephalic and atraumatic.  Eyes: Pupils are equal, round, and reactive to light. EOM are normal.  Cardiovascular: Normal rate.  Pulmonary/Chest: Effort normal.  Abdominal: Soft. She exhibits no distension. There is no tenderness.  Neurological: She is alert. No cranial nerve deficit.  Skin: Skin is warm. No rash noted.  Psychiatric: She has a normal mood and affect. Her behavior is normal. Judgment and thought content normal.    Assessment & Plan:  Neck pain  Chronic bilateral thoracic back pain  Symptomatic mammary hypertrophy  I think she would do well with bilateral breast reduction.  We need to have the mammogram report sent over to us as well as the notes from her family doctor.  She is going to think about having the surgery and let us know.  We will not submit until we hear back from her.  Wanting another child and may decide try and breast-feed again.  Alena Billslaire S Caylin Nass, DO

## 2018-10-05 NOTE — Telephone Encounter (Signed)
This encounter was created in error - please disregard.

## 2019-02-02 DIAGNOSIS — M549 Dorsalgia, unspecified: Secondary | ICD-10-CM | POA: Diagnosis not present

## 2019-02-02 DIAGNOSIS — Z0000 Encounter for general adult medical examination without abnormal findings: Secondary | ICD-10-CM | POA: Diagnosis not present

## 2019-02-02 DIAGNOSIS — Z1389 Encounter for screening for other disorder: Secondary | ICD-10-CM | POA: Diagnosis not present

## 2019-02-02 DIAGNOSIS — K429 Umbilical hernia without obstruction or gangrene: Secondary | ICD-10-CM | POA: Diagnosis not present

## 2019-02-02 DIAGNOSIS — Z13220 Encounter for screening for lipoid disorders: Secondary | ICD-10-CM | POA: Diagnosis not present

## 2020-03-05 DIAGNOSIS — N926 Irregular menstruation, unspecified: Secondary | ICD-10-CM | POA: Diagnosis not present

## 2020-03-05 DIAGNOSIS — K429 Umbilical hernia without obstruction or gangrene: Secondary | ICD-10-CM | POA: Diagnosis not present

## 2020-03-05 DIAGNOSIS — Z1389 Encounter for screening for other disorder: Secondary | ICD-10-CM | POA: Diagnosis not present

## 2020-03-05 DIAGNOSIS — Z0000 Encounter for general adult medical examination without abnormal findings: Secondary | ICD-10-CM | POA: Diagnosis not present

## 2020-03-05 DIAGNOSIS — Z13220 Encounter for screening for lipoid disorders: Secondary | ICD-10-CM | POA: Diagnosis not present

## 2020-03-13 DIAGNOSIS — N910 Primary amenorrhea: Secondary | ICD-10-CM | POA: Diagnosis not present

## 2020-03-13 DIAGNOSIS — Z01419 Encounter for gynecological examination (general) (routine) without abnormal findings: Secondary | ICD-10-CM | POA: Diagnosis not present

## 2020-06-24 ENCOUNTER — Ambulatory Visit (HOSPITAL_COMMUNITY)
Admission: EM | Admit: 2020-06-24 | Discharge: 2020-06-24 | Disposition: A | Discharge status: Home / Self Care | Payer: 59 | Attending: Family Medicine | Admitting: Family Medicine

## 2020-06-24 ENCOUNTER — Other Ambulatory Visit: Payer: Self-pay

## 2020-06-24 ENCOUNTER — Encounter (HOSPITAL_COMMUNITY): Payer: Self-pay

## 2020-06-24 DIAGNOSIS — Z3201 Encounter for pregnancy test, result positive: Secondary | ICD-10-CM | POA: Diagnosis not present

## 2020-06-24 LAB — POC URINE PREG, ED: Preg Test, Ur: POSITIVE — AB

## 2020-06-24 NOTE — ED Triage Notes (Signed)
LMP 05/14/20, + home preg test, requesting confirmation

## 2020-06-24 NOTE — ED Provider Notes (Signed)
[facility]    CSN: [id number] Arrival date & time: 06/24/20  1001      History   Chief Complaint Chief Complaint  Patient presents with  . Possible Pregnancy    HPI Marcia Bryant is a 34 y.o. female.   Patient is a 34 year old female presents today for pregnancy test confirmation.  Reporting last menstrual period was 05/14/2020.  She had a positive pregnancy test at home.     Past Medical History:  Diagnosis Date  . Bacterial vaginosis   . Diabetes mellitus without complication (HCC)   . Gestational diabetes   . Preeclampsia 01/25/2013  . Umbilical hernia 05/20/2016  . Yeast infection     Patient Active Problem List   Diagnosis Date Noted  . Symptomatic mammary hypertrophy 09/20/2018  . Chronic bilateral thoracic back pain 09/20/2018  . Neck pain 09/20/2018  . Gestational diabetes, diet controlled 12/12/2016  . Gestational diabetes mellitus (GDM), antepartum 06/15/2016  . failed early 1h (154) 05/25/2016  . Supervision of high-risk pregnancy 05/20/2016  . BMI 33.0-33.9,adult 05/20/2016  . Obesity in pregnancy 05/20/2016  . History of pre-eclampsia 05/20/2016  . History of shoulder dystocia in prior pregnancy 05/20/2016  . Umbilical hernia 05/20/2016    Past Surgical History:  Procedure Laterality Date  . CESAREAN SECTION N/A 12/13/2016   Procedure: CESAREAN SECTION;  Surgeon: Adam Phenix, MD;  Location: [facility];  Service: Obstetrics;  Laterality: N/A;    OB History    Gravida  4   Para  3   Term  3   Preterm      AB  1   Living  3     SAB  1   TAB      Ectopic      Multiple  0   Live Births  3            Home Medications    Prior to Admission medications   Not on File    Family History Family History  Problem Relation Age of Onset  . Hypertension Father   . Hypertension Paternal Grandmother     Social History Social History   Tobacco Use  . Smoking status: Never Smoker  . Smokeless  tobacco: Never Used  Vaping Use  . Vaping Use: Never used  Substance Use Topics  . Alcohol use: No  . Drug use: No     Allergies   Patient has no known allergies.   Review of Systems Review of Systems   Physical Exam Triage Vital Signs ED Triage Vitals  Enc Vitals Group     BP 06/24/20 1054 123/69     Pulse Rate 06/24/20 1053 66     Resp 06/24/20 1053 16     Temp 06/24/20 1053 98.1 F (36.7 C)     Temp src --      SpO2 06/24/20 1053 100 %     Weight --      Height --      Head Circumference --      Peak Flow --      Pain Score 06/24/20 1053 0     Pain Loc --      Pain Edu? --      Excl. in GC? --    No data found.  Updated Vital Signs BP 123/69   Pulse 66   Temp 98.1 F (36.7 C)   Resp 16   LMP 05/14/2020   SpO2 100%   Visual Acuity  Right Eye Distance:   Left Eye Distance:   Bilateral Distance:    Right Eye Near:   Left Eye Near:    Bilateral Near:     Physical Exam Vitals and nursing note reviewed.  Constitutional:      General: She is not in acute distress.    Appearance: Normal appearance. She is not ill-appearing, toxic-appearing or diaphoretic.  HENT:     Head: Normocephalic.     Nose: Nose normal.  Eyes:     Conjunctiva/sclera: Conjunctivae normal.  Pulmonary:     Effort: Pulmonary effort is normal.  Musculoskeletal:        General: Normal range of motion.     Cervical back: Normal range of motion.  Skin:    General: Skin is warm and dry.     Findings: No rash.  Neurological:     Mental Status: She is alert.  Psychiatric:        Mood and Affect: Mood normal.      UC Treatments / Results  Labs (all labs ordered are listed, but only abnormal results are displayed) Labs Reviewed  POC URINE PREG, ED - Abnormal; Notable for the following components:      Result Value   Preg Test, Ur POSITIVE (*)    All other components within normal limits    EKG   Radiology No results found.  Procedures Procedures (including  critical care time)  Medications Ordered in UC Medications - No data to display  Initial Impression / Assessment and Plan / UC Course  I have reviewed the triage vital signs and the nursing notes.  Pertinent labs & imaging results that were available during my care of the patient were reviewed by me and considered in my medical decision making (see chart for details).     Positive pregnancy test Follow-up with OB/GYN for further management of this pregnancy Final Clinical Impressions(s) / UC Diagnoses   Final diagnoses:  Positive pregnancy test     Discharge Instructions     Your pregnancy test was positive  Please follow-up with your OB/GYN for further management of this pregnancy.     ED Prescriptions    None     PDMP not reviewed this encounter.   Janace Aris, NP 06/24/20 1124

## 2020-06-24 NOTE — Discharge Instructions (Addendum)
Your pregnancy test was positive  Please follow-up with your OB/GYN for further management of this pregnancy.

## 2020-07-01 ENCOUNTER — Encounter (HOSPITAL_COMMUNITY): Payer: Self-pay | Admitting: Obstetrics and Gynecology

## 2020-07-01 ENCOUNTER — Inpatient Hospital Stay (HOSPITAL_COMMUNITY)
Admission: AD | Admit: 2020-07-01 | Discharge: 2020-07-01 | Disposition: A | Discharge status: Home / Self Care | Payer: 59 | Attending: Obstetrics and Gynecology | Admitting: Obstetrics and Gynecology

## 2020-07-01 ENCOUNTER — Other Ambulatory Visit: Payer: Self-pay

## 2020-07-01 ENCOUNTER — Inpatient Hospital Stay (HOSPITAL_COMMUNITY): Payer: 59

## 2020-07-01 DIAGNOSIS — O09291 Supervision of pregnancy with other poor reproductive or obstetric history, first trimester: Secondary | ICD-10-CM | POA: Insufficient documentation

## 2020-07-01 DIAGNOSIS — Z3201 Encounter for pregnancy test, result positive: Secondary | ICD-10-CM | POA: Diagnosis not present

## 2020-07-01 DIAGNOSIS — O26891 Other specified pregnancy related conditions, first trimester: Secondary | ICD-10-CM | POA: Diagnosis not present

## 2020-07-01 DIAGNOSIS — R1031 Right lower quadrant pain: Secondary | ICD-10-CM | POA: Insufficient documentation

## 2020-07-01 DIAGNOSIS — O24111 Pre-existing diabetes mellitus, type 2, in pregnancy, first trimester: Secondary | ICD-10-CM | POA: Insufficient documentation

## 2020-07-01 DIAGNOSIS — Z3A01 Less than 8 weeks gestation of pregnancy: Secondary | ICD-10-CM | POA: Diagnosis not present

## 2020-07-01 DIAGNOSIS — Z8759 Personal history of other complications of pregnancy, childbirth and the puerperium: Secondary | ICD-10-CM | POA: Insufficient documentation

## 2020-07-01 DIAGNOSIS — O200 Threatened abortion: Secondary | ICD-10-CM | POA: Diagnosis not present

## 2020-07-01 DIAGNOSIS — O468X1 Other antepartum hemorrhage, first trimester: Secondary | ICD-10-CM

## 2020-07-01 DIAGNOSIS — Z833 Family history of diabetes mellitus: Secondary | ICD-10-CM | POA: Diagnosis not present

## 2020-07-01 DIAGNOSIS — O208 Other hemorrhage in early pregnancy: Secondary | ICD-10-CM | POA: Diagnosis not present

## 2020-07-01 DIAGNOSIS — O418X10 Other specified disorders of amniotic fluid and membranes, first trimester, not applicable or unspecified: Secondary | ICD-10-CM

## 2020-07-01 DIAGNOSIS — R109 Unspecified abdominal pain: Secondary | ICD-10-CM

## 2020-07-01 LAB — HCG, QUANTITATIVE, PREGNANCY: hCG, Beta Chain, Quant, S: 16332 m[IU]/mL — ABNORMAL HIGH (ref <5)

## 2020-07-01 LAB — WET PREP, GENITAL
Clue Cells Wet Prep HPF POC: NONE SEEN
Sperm: NONE SEEN
Trich, Wet Prep: NONE SEEN
Yeast Wet Prep HPF POC: NONE SEEN

## 2020-07-01 LAB — URINALYSIS, ROUTINE W REFLEX MICROSCOPIC
Bilirubin Urine: NEGATIVE
Glucose, UA: NEGATIVE mg/dL
Hgb urine dipstick: NEGATIVE
Ketones, ur: NEGATIVE mg/dL
Nitrite: NEGATIVE
Protein, ur: NEGATIVE mg/dL
Specific Gravity, Urine: 1.021 (ref 1.005–1.030)
pH: 6 (ref 5.0–8.0)

## 2020-07-01 LAB — CBC
HCT: 38.6 % (ref 36.0–46.0)
Hemoglobin: 12.6 g/dL (ref 12.0–15.0)
MCH: 26.8 pg (ref 26.0–34.0)
MCHC: 32.6 g/dL (ref 30.0–36.0)
MCV: 82 fL (ref 80.0–100.0)
Platelets: 277 10*3/uL (ref 150–400)
RBC: 4.71 MIL/uL (ref 3.87–5.11)
RDW: 14.8 % (ref 11.5–15.5)
WBC: 15.9 10*3/uL — ABNORMAL HIGH (ref 4.0–10.5)
nRBC: 0 % (ref 0.0–0.2)

## 2020-07-01 LAB — HIV ANTIBODY (ROUTINE TESTING W REFLEX): HIV Screen 4th Generation wRfx: NONREACTIVE

## 2020-07-01 NOTE — MAU Provider Note (Signed)
History     CSN: [id number]  Arrival date and time: 07/01/20 5397   First Provider Initiated Contact with Patient 07/01/20 2151      Chief Complaint  Patient presents with  . Abdominal Pain   [name] is a 34 y.o. year old G58P3013 female at [redacted]w[redacted]d weeks gestation who was sent to MAU from [facility] with reports of sharp, RLQ pain x 2 days; rated 6/10.  She reports her last menstrual period was May 14, 2020.  She had a positive pregnancy test in the [facility] emergency department.  She also reports thick white foul odor vaginal discharge.  Her last sexual intercourse was last week; before the pain started. She has a h/o 2 vaginal deliveries, 1 C/S for FTP and 1 SAB.    OB History    Gravida  5   Para  3   Term  3   Preterm      AB  1   Living  3     SAB  1   TAB      Ectopic      Multiple  0   Live Births  3           Past Medical History:  Diagnosis Date  . Bacterial vaginosis   . Diabetes mellitus without complication (HCC)   . Gestational diabetes   . Preeclampsia 01/25/2013  . Umbilical hernia 05/20/2016  . Yeast infection     Past Surgical History:  Procedure Laterality Date  . CESAREAN SECTION N/A 12/13/2016   Procedure: CESAREAN SECTION;  Surgeon: Adam Phenix, MD;  Location: [facility] BIRTHING SUITES;  Service: Obstetrics;  Laterality: N/A;    Family History  Problem Relation Age of Onset  . Hypertension Father   . Hypertension Paternal Grandmother   . Diabetes Mother     Social History   Tobacco Use  . Smoking status: Never Smoker  . Smokeless tobacco: Never Used  Vaping Use  . Vaping Use: Never used  Substance Use Topics  . Alcohol use: No  . Drug use: No    Allergies: No Active Allergies  Facility-Administered Medications Prior to Admission  Medication Dose Route Frequency Provider Last Rate Last Admin  . medroxyPROGESTERone (DEPO-PROVERA) injection 150 mg  150 mg Intramuscular Q90 days Brock Bad, MD   150 mg at  04/02/17 1035   Medications Prior to Admission  Medication Sig Dispense Refill Last Dose  . Prenatal Vit-Fe Fumarate-FA (PRENATAL MULTIVITAMIN) TABS tablet Take 1 tablet by mouth daily at 12 noon.   07/01/2020 at Unknown time    Review of Systems  Constitutional: Negative.   HENT: Negative.   Eyes: Negative.   Respiratory: Negative.   Cardiovascular: Negative.   Gastrointestinal: Negative.   Endocrine: Negative.   Genitourinary: Positive for pelvic pain (RT x 2 days) and vaginal discharge (thick, foul odor).  Musculoskeletal: Negative.   Skin: Negative.   Allergic/Immunologic: Negative.   Neurological: Negative.   Hematological: Negative.   Psychiatric/Behavioral: Negative.    Physical Exam   Blood pressure 120/71, pulse 84, temperature 98.8 F (37.1 C), temperature source Oral, resp. rate 20, last menstrual period 05/14/2020, SpO2 100 %.  Physical Exam Vitals and nursing note reviewed. Exam conducted with a chaperone present.  Constitutional:      Appearance: She is well-developed and normal weight.  HENT:     Head: Normocephalic and atraumatic.  Cardiovascular:     Rate and Rhythm: Normal rate.  Pulmonary:  Effort: Pulmonary effort is normal.  Abdominal:     Palpations: Abdomen is soft.  Genitourinary:    Vagina: Normal.     Cervix: Normal.     Uterus: Enlarged.      Adnexa: Left adnexa normal.       Right: Tenderness present.      Comments: Uterus: non-tender, SE: cervix is smooth, pink, no lesions, scant amt of thick, white vaginal d/c -- WP, GC/CT done, closed/long/firm, no CMT or friability, moderate RT adnexal tenderness  Skin:    General: Skin is warm and dry.  Neurological:     Mental Status: She is alert and oriented to person, place, and time.  Psychiatric:        Mood and Affect: Mood normal.        Behavior: Behavior normal.     MAU Course  Procedures  MDM CCUA UPT CBC ABO/Rh -- known, not drawn HCG Wet Prep GC/CT -- pending HIV --  pending OB < 14 wks [place] with TV  Results for orders placed or performed during the hospital encounter of 07/01/20 (from the past 24 hour(s))  CBC     Status: Abnormal   Collection Time: 07/01/20  9:31 PM  Result Value Ref Range   WBC 15.9 (H) 4.0 - 10.5 K/uL   RBC 4.71 3.87 - 5.11 MIL/uL   Hemoglobin 12.6 12.0 - 15.0 g/dL   HCT 97.3 36 - 46 %   MCV 82.0 80.0 - 100.0 fL   MCH 26.8 26.0 - 34.0 pg   MCHC 32.6 30.0 - 36.0 g/dL   RDW 53.2 99.2 - 42.6 %   Platelets 277 150 - 400 K/uL   nRBC 0.0 0.0 - 0.2 %  hCG, quantitative, pregnancy     Status: Abnormal   Collection Time: 07/01/20  9:31 PM  Result Value Ref Range   hCG, Beta Chain, Quant, S 16,332 (H) <5 mIU/mL  Urinalysis, Routine w reflex microscopic PATH Cytology Cervicovaginal Ancillary Only     Status: Abnormal   Collection Time: 07/01/20  9:44 PM  Result Value Ref Range   Color, Urine YELLOW YELLOW   APPearance HAZY (A) CLEAR   Specific Gravity, Urine 1.021 1.005 - 1.030   pH 6.0 5.0 - 8.0   Glucose, UA NEGATIVE NEGATIVE mg/dL   Hgb urine dipstick NEGATIVE NEGATIVE   Bilirubin Urine NEGATIVE NEGATIVE   Ketones, ur NEGATIVE NEGATIVE mg/dL   Protein, ur NEGATIVE NEGATIVE mg/dL   Nitrite NEGATIVE NEGATIVE   Leukocytes,Ua TRACE (A) NEGATIVE   RBC / HPF 0-5 0 - 5 RBC/hpf   WBC, UA 6-10 0 - 5 WBC/hpf   Bacteria, UA RARE (A) NONE SEEN   Squamous Epithelial / LPF 6-10 0 - 5   Mucus PRESENT   Wet prep, genital     Status: Abnormal   Collection Time: 07/01/20 10:02 PM   Specimen: PATH Cytology Cervicovaginal Ancillary Only  Result Value Ref Range   Yeast Wet Prep HPF POC NONE SEEN NONE SEEN   Trich, Wet Prep NONE SEEN NONE SEEN   Clue Cells Wet Prep HPF POC NONE SEEN NONE SEEN   WBC, Wet Prep HPF POC MANY (A) NONE SEEN   Sperm NONE SEEN     US OB LESS THAN 14 WEEKS WITH OB TRANSVAGINAL  Result Date: 07/01/2020 CLINICAL DATA:  Right-sided pelvic pain for 2 days, unknown positive pregnancy test EXAM: OBSTETRIC <14 WK [place] AND  TRANSVAGINAL OB US TECHNIQUE: Both transabdominal and transvaginal ultrasound examinations were  performed for complete evaluation of the gestation as well as the maternal uterus, adnexal regions, and pelvic cul-de-sac. Transvaginal technique was performed to assess early pregnancy. COMPARISON:  None. FINDINGS: Intrauterine gestational sac: Present Yolk sac:  Present Embryo:  Present Cardiac Activity: Present Heart Rate: Unable to adequately gauge CRL:  3.8 mm   6 w   0 d                  [place] EDC: 02/14/2021 Subchorionic hemorrhage:  Large subchorionic hemorrhage is noted. Maternal uterus/adnexae: Ovaries are within normal limits. IMPRESSION: Single live intrauterine gestation at 6 weeks 0 days. Large subchorionic hemorrhage is noted. Electronically Signed   By: Alcide Clever M.D.   On: 07/01/2020 22:31     Assessment and Plan  Abdominal pain during pregnancy in first trimester  - Information provided on abdominal pain in pregnancy - Advised to take Tylenol 1000 mg po prn pain   Subchorionic hematoma in first trimester, single or unspecified fetus  - Information provided on [facility] and threatened miscarriage - Advised she may have some brown or dark red VB - Return to MAU:  If you have heavy bleeding that soaks through more that 2 pads per hour for an hour or more  If you bleed so much that you feel like you might pass out or you do pass out  If you have significant abdominal pain that is not improved with Tylenol 1000 mg every 6 hours as needed for pain  If you develop a fever > 100.5   - Discharge patient - Advised to call [facility] to scheduled NOB appt and establish [facility] - Patient verbalized an understanding of the plan of care and agrees.     Raelyn Mora, MSN, CNM 07/01/2020, 9:51 PM

## 2020-07-01 NOTE — ED Triage Notes (Addendum)
Pt presents to ED POV. Pt c/o LLQ abd pain, pain is sharp and cramping. LMP 05/14/20. Denies bleeding. G5P3.

## 2020-07-01 NOTE — ED Notes (Signed)
Transport called for pt, report called to charge

## 2020-07-01 NOTE — Discharge Instructions (Signed)
Return to MAU:  If you have heavy bleeding that soaks through more that 2 pads per hour for an hour or more  If you bleed so much that you feel like you might pass out or you do pass out  If you have significant abdominal pain that is not improved with Tylenol 1000 mg every 6 hours as needed for pain  If you develop a fever > 100.5   

## 2020-07-01 NOTE — MAU Note (Addendum)
.  Marcia Bryant is a 34 y.o. at [redacted]w[redacted]d here in MAU via ED transfer reporting: sharp right mid and right lower abdominal pain that began Saturday, 06/29/20. Pt reports last intercourse was last week. No VB.   LMP: 05/14/20 Pain score: 6/10  Update: White, thick, foul discharge reported.

## 2020-07-01 NOTE — ED Provider Notes (Signed)
MSE was initiated and I personally evaluated the patient and placed orders (if any) at  8:21 PM on July 01, 2020.  34 year old female G5, P3 last menstrual period 7/6, positive pregnancy test, presenting with complaints of abdominal pain.  She is here with right lower quadrant abdominal pain for the past 2 days.  She has not had a formal ultrasound.  She has an intact appendix.  No vaginal bleeding or vaginal discharge.  On exam patient is resting comfortably, heart and lung sounds normal, tenderness to right lower abdomen on palpation without guarding or rebound tenderness.  The patient appears stable so that the remainder of the MSE may be completed by another provider.  I have reached out to our MAU provider who agrees to accept the pt.    BP 120/71 (BP Location: Left Arm)   Pulse 84   Temp 98.8 F (37.1 C) (Oral)   Resp 20   SpO2 100%     Fayrene Helper, PA-C 07/01/20 2039    Gwyneth Sprout, MD 07/01/20 [phone]

## 2020-07-02 LAB — GC/CHLAMYDIA PROBE AMP (~~LOC~~) NOT AT ARMC
Chlamydia: NEGATIVE
Comment: NEGATIVE
Comment: NORMAL
Neisseria Gonorrhea: NEGATIVE

## 2020-07-31 ENCOUNTER — Ambulatory Visit: Payer: 59 | Admitting: *Deleted

## 2020-07-31 DIAGNOSIS — Z3480 Encounter for supervision of other normal pregnancy, unspecified trimester: Secondary | ICD-10-CM

## 2020-07-31 NOTE — Progress Notes (Signed)
Patient was assessed and managed by nursing staff during this encounter. I have reviewed the chart and agree with the documentation and plan. I have also made any necessary editorial changes.  Catalina Antigua, MD 07/31/2020 11:03 AM

## 2020-07-31 NOTE — Progress Notes (Signed)
I connected with  Marcia Bryant on 07/31/20 by a video enabled telemedicine application and verified that I am speaking with the correct person using two identifiers.   I discussed the limitations of evaluation and management by telemedicine. The patient expressed understanding and agreed to proceed.  PRENATAL INTAKE SUMMARY  Marcia Bryant presents today New OB Nurse Interview.  OB History    Gravida  5   Para  3   Term  3   Preterm      AB  1   Living  3     SAB  1   TAB      Ectopic      Multiple  0   Live Births  3          I have reviewed the patient's medical, obstetrical, social, and family histories, medications, and available lab results.  SUBJECTIVE She has no unusual complaints  OBJECTIVE Initial Intake Exam (New OB)  GENERAL APPEARANCE: sounds alert via televisit   ASSESSMENT Normal pregnancy  PLAN Prenatal care at [facility]

## 2020-08-07 ENCOUNTER — Encounter: Payer: Self-pay | Admitting: Obstetrics and Gynecology

## 2020-08-07 ENCOUNTER — Other Ambulatory Visit: Payer: Self-pay

## 2020-08-07 ENCOUNTER — Ambulatory Visit (INDEPENDENT_AMBULATORY_CARE_PROVIDER_SITE_OTHER): Payer: 59 | Admitting: Obstetrics and Gynecology

## 2020-08-07 VITALS — BP 120/72 | HR 92 | Temp 98.4°F | Wt 206.5 lb

## 2020-08-07 DIAGNOSIS — O99211 Obesity complicating pregnancy, first trimester: Secondary | ICD-10-CM

## 2020-08-07 DIAGNOSIS — Z3A12 12 weeks gestation of pregnancy: Secondary | ICD-10-CM | POA: Diagnosis not present

## 2020-08-07 DIAGNOSIS — Z3480 Encounter for supervision of other normal pregnancy, unspecified trimester: Secondary | ICD-10-CM

## 2020-08-07 DIAGNOSIS — Z98891 History of uterine scar from previous surgery: Secondary | ICD-10-CM | POA: Insufficient documentation

## 2020-08-07 DIAGNOSIS — O99891 Other specified diseases and conditions complicating pregnancy: Secondary | ICD-10-CM | POA: Diagnosis not present

## 2020-08-07 DIAGNOSIS — Z315 Encounter for genetic counseling: Secondary | ICD-10-CM | POA: Diagnosis not present

## 2020-08-07 DIAGNOSIS — K429 Umbilical hernia without obstruction or gangrene: Secondary | ICD-10-CM | POA: Diagnosis not present

## 2020-08-07 DIAGNOSIS — Z8759 Personal history of other complications of pregnancy, childbirth and the puerperium: Secondary | ICD-10-CM

## 2020-08-07 DIAGNOSIS — O99210 Obesity complicating pregnancy, unspecified trimester: Secondary | ICD-10-CM

## 2020-08-07 DIAGNOSIS — Z3481 Encounter for supervision of other normal pregnancy, first trimester: Secondary | ICD-10-CM | POA: Diagnosis not present

## 2020-08-07 DIAGNOSIS — M549 Dorsalgia, unspecified: Secondary | ICD-10-CM | POA: Diagnosis not present

## 2020-08-07 DIAGNOSIS — Z3482 Encounter for supervision of other normal pregnancy, second trimester: Secondary | ICD-10-CM | POA: Diagnosis not present

## 2020-08-07 MED ORDER — CYCLOBENZAPRINE HCL 10 MG PO TABS: 10.0000 mg | ORAL_TABLET | Freq: Three times a day (TID) | ORAL | 1 refills | Status: DC | PRN

## 2020-08-07 MED ORDER — ASPIRIN 81 MG PO CHEW: 81.0000 mg | CHEWABLE_TABLET | Freq: Every day | ORAL | 7 refills | Status: DC

## 2020-08-07 NOTE — Progress Notes (Signed)
Pt is here for initial OB visit. LMP 05/14/20, EDD 02/18/21.

## 2020-08-07 NOTE — Progress Notes (Signed)
INITIAL PRENATAL VISIT NOTE  Subjective:  Marcia Bryant is a 34 y.o. [id number] at [redacted]w[redacted]d by  Sure LMP c/w 6 week u/s being seen today for her initial prenatal visit. This is a planned pregnancy.  She was using nothing for birth control previously. She has an obstetric history significant for cesarean section and preeclampsia x 1. She has a medical history significant for significant umbilical hernia.  Patient reports no complaints.  Contractions: Not present. Vag. Bleeding: None.   . Denies leaking of fluid.    Past Medical History:  Diagnosis Date  . Bacterial vaginosis   . Diabetes mellitus without complication (HCC)   . Gestational diabetes   . Preeclampsia 01/25/2013  . Umbilical hernia 05/20/2016  . Yeast infection     Past Surgical History:  Procedure Laterality Date  . CESAREAN SECTION N/A 12/13/2016   Procedure: CESAREAN SECTION;  Surgeon: Adam Phenix, MD;  Location: [facility] BIRTHING SUITES;  Service: Obstetrics;  Laterality: N/A;    OB History  Gravida Para Term Preterm AB Living  5 3 3   1 3   SAB TAB Ectopic Multiple Live Births  1     0 3    # Outcome Date GA Lbr Len/2nd Weight Sex Delivery Anes PTL Lv  5 Current           4 Term 12/13/16 [redacted]w[redacted]d  8 lb 12.7 oz (3.99 kg) M CS-LVertical EPI  LIV  3 Term 01/26/13 [redacted]w[redacted]d 10:04 / 00:34 8 lb 9.8 oz (3.907 kg) M Vag-Spont EPI  LIV  2 SAB 01/2012 [redacted]w[redacted]d            Birth Comments: Passed naturally;no complications  1 Term 03/21/11 [redacted]w[redacted]d  6 lb (2.722 kg) M Vag-Spont EPI  LIV     Birth Comments: No complications    Social History   Socioeconomic History  . Marital status: Single    Spouse name: [redacted]w[redacted]d  . Number of children: 1  . Years of education: 57  . Highest education level: Not on file  Occupational History  . Occupation: Dietary Aid    Employer: [employer]    Comment: [employer]  Tobacco Use  . Smoking status: Never Smoker  . Smokeless tobacco: Never Used  Vaping Use  . Vaping Use: Never used    Substance and Sexual Activity  . Alcohol use: No  . Drug use: No  . Sexual activity: Yes    Partners: Male    Birth control/protection: Pill  Other Topics Concern  . Not on file  Social History Narrative  . Not on file   Social Determinants of Health   Financial Resource Strain:   . Difficulty of Paying Living Expenses: Not on file  Food Insecurity:   . Worried About 14 in the Last Year: Not on file  . Ran Out of Food in the Last Year: Not on file  Transportation Needs:   . Lack of Transportation (Medical): Not on file  . Lack of Transportation (Non-Medical): Not on file  Physical Activity:   . Days of Exercise per Week: Not on file  . Minutes of Exercise per Session: Not on file  Stress:   . Feeling of Stress : Not on file  Social Connections:   . Frequency of Communication with Friends and Family: Not on file  . Frequency of Social Gatherings with Friends and Family: Not on file  . Attends Religious Services: Not on file  . Active Member of  Clubs or Organizations: Not on file  . Attends Banker Meetings: Not on file  . Marital Status: Not on file    Family History  Problem Relation Age of Onset  . Hypertension Father   . Hypertension Paternal Grandmother   . Diabetes Mother      Current Outpatient Medications:  .  Prenatal Vit-Fe Fumarate-FA (PRENATAL MULTIVITAMIN) TABS tablet, Take 1 tablet by mouth daily at 12 noon., Disp: , Rfl:  .  aspirin 81 MG chewable tablet, Chew 1 tablet (81 mg total) by mouth daily., Disp: 30 tablet, Rfl: 7  No Active Allergies  Review of Systems: Negative except for what is mentioned in HPI.  Objective:   Vitals:   08/07/20 1011  BP: 120/72  Pulse: 92  Temp: 98.4 F (36.9 C)  Weight: 206 lb 8 oz (93.7 kg)    Fetal Status: Fetal Heart Rate (bpm): seen on [place]         Physical Exam: BP 120/72   Pulse 92   Temp 98.4 F (36.9 C)   Wt 206 lb 8 oz (93.7 kg)   LMP 05/14/2020   BMI 33.33 kg/m   CONSTITUTIONAL: Well-developed, well-nourished female in no acute distress.  NEUROLOGIC: Alert and oriented to person, place, and time. Normal reflexes, muscle tone coordination. No cranial nerve deficit noted. PSYCHIATRIC: Normal mood and affect. Normal behavior. Normal judgment and thought content. SKIN: Skin is warm and dry. No rash noted. Not diaphoretic. No erythema. No pallor. HENT:  Normocephalic, atraumatic, External right and left ear normal. Oropharynx is clear and moist EYES: Conjunctivae and EOM are normal. Pupils are equal, round, and reactive to light. No scleral icterus.  NECK: Normal range of motion, supple, no masses CARDIOVASCULAR: Normal heart rate noted, regular rhythm RESPIRATORY: Effort and breath sounds normal, no problems with respiration noted ABDOMEN: Soft, nontender, nondistended, gravid.  Large umbilical hernia noted, nontender GU: normal appearing external female genitalia, multiparous, normal appearing cervix, scant white discharge in vagina, no lesions noted Bimanual: 12 weeks sized uterus, no adnexal tenderness or palpable lesions noted MUSCULOSKELETAL: Normal range of motion. EXT:  No edema and no tenderness. 2+ distal pulses.  Bedside u/s showed positive fetal movement, positive fetal heart motion Assessment and Plan:  Pregnancy: G5P3013 at [redacted]w[redacted]d by  sureLMP  1. Supervision of other normal pregnancy, antepartum  - Culture, OB Urine - Genetic Screening - CBC/D/Plt+RPR+Rh+ABO+Rub Ab... - Enroll Patient in Babyscripts - [place] MFM OB DETAIL +14 WK; Future - aspirin 81 MG chewable tablet; Chew 1 tablet (81 mg total) by mouth daily.  Dispense: 30 tablet; Refill: 7  2. [redacted] weeks gestation of pregnancy   3. Obesity in pregnancy   4. History of pre-eclampsia Baseline labs acquired - aspirin 81 MG chewable tablet; Chew 1 tablet (81 mg total) by mouth daily.  Dispense: 30 tablet; Refill: 7 - Comprehensive metabolic panel - Protein / creatinine ratio,  urine  5. History of shoulder dystocia in prior pregnancy   6. History of cesarean delivery Pt desires repeat c section with bilateral tubal ligation  7. Back pain in pregnancy:  rx for flexeril given   Preterm labor symptoms and general obstetric precautions including but not limited to vaginal bleeding, contractions, leaking of fluid and fetal movement were reviewed in detail with the patient.  Please refer to After Visit Summary for other counseling recommendations.   Return in about 4 weeks (around 09/04/2020) for ROB, in person.  Warden Fillers 08/07/2020 10:49 AM

## 2020-08-07 NOTE — Patient Instructions (Signed)
First Trimester of Pregnancy  The first trimester of pregnancy is from week 1 until the end of week 13 (months 1 through 3). During this time, your baby will begin to develop inside you. At 6-8 weeks, the eyes and face are formed, and the heartbeat can be seen on ultrasound. At the end of 12 weeks, all the baby's organs are formed. Prenatal care is all the medical care you receive before the birth of your baby. Make sure you get good prenatal care and follow all of your doctor's instructions. Follow these instructions at home: Medicines  Take over-the-counter and prescription medicines only as told by your doctor. Some medicines are safe and some medicines are not safe during pregnancy.  Take a prenatal vitamin that contains at least 600 micrograms (mcg) of folic acid.  If you have trouble pooping (constipation), take medicine that will make your stool soft (stool softener) if your doctor approves. Eating and drinking   Eat regular, healthy meals.  Your doctor will tell you the amount of weight gain that is right for you.  Avoid raw meat and uncooked cheese.  If you feel sick to your stomach (nauseous) or throw up (vomit): ? Eat 4 or 5 small meals a day instead of 3 large meals. ? Try eating a few soda crackers. ? Drink liquids between meals instead of during meals.  To prevent constipation: ? Eat foods that are high in fiber, like fresh fruits and vegetables, whole grains, and beans. ? Drink enough fluids to keep your pee (urine) clear or pale yellow. Activity  Exercise only as told by your doctor. Stop exercising if you have cramps or pain in your lower belly (abdomen) or low back.  Do not exercise if it is too hot, too humid, or if you are in a place of great height (high altitude).  Try to avoid standing for long periods of time. Move your legs often if you must stand in one place for a long time.  Avoid heavy lifting.  Wear low-heeled shoes. Sit and stand up  straight.  You can have sex unless your doctor tells you not to. Relieving pain and discomfort  Wear a good support bra if your breasts are sore.  Take warm water baths (sitz baths) to soothe pain or discomfort caused by hemorrhoids. Use hemorrhoid cream if your doctor says it is okay.  Rest with your legs raised if you have leg cramps or low back pain.  If you have puffy, bulging veins (varicose veins) in your legs: ? Wear support hose or compression stockings as told by your doctor. ? Raise (elevate) your feet for 15 minutes, 3-4 times a day. ? Limit salt in your food. Prenatal care  Schedule your prenatal visits by the twelfth week of pregnancy.  Write down your questions. Take them to your prenatal visits.  Keep all your prenatal visits as told by your doctor. This is important. Safety  Wear your seat belt at all times when driving.  Make a list of emergency phone numbers. The list should include numbers for family, friends, the hospital, and police and fire departments. General instructions  Ask your doctor for a referral to a local prenatal class. Begin classes no later than at the start of month 6 of your pregnancy.  Ask for help if you need counseling or if you need help with nutrition. Your doctor can give you advice or tell you where to go for help.  Do not use hot tubs, steam   rooms, or saunas.  Do not douche or use tampons or scented sanitary pads.  Do not cross your legs for long periods of time.  Avoid all herbs and alcohol. Avoid drugs that are not approved by your doctor.  Do not use any tobacco products, including cigarettes, chewing tobacco, and electronic cigarettes. If you need help quitting, ask your doctor. You may get counseling or other support to help you quit.  Avoid cat litter boxes and soil used by cats. These carry germs that can cause birth defects in the baby and can cause a loss of your baby (miscarriage) or stillbirth.  Visit your dentist.  At home, brush your teeth with a soft toothbrush. Be gentle when you floss. Contact a doctor if:  You are dizzy.  You have mild cramps or pressure in your lower belly.  You have a nagging pain in your belly area.  You continue to feel sick to your stomach, you throw up, or you have watery poop (diarrhea).  You have a bad smelling fluid coming from your vagina.  You have pain when you pee (urinate).  You have increased puffiness (swelling) in your face, hands, legs, or ankles. Get help right away if:  You have a fever.  You are leaking fluid from your vagina.  You have spotting or bleeding from your vagina.  You have very bad belly cramping or pain.  You gain or lose weight rapidly.  You throw up blood. It may look like coffee grounds.  You are around people who have German measles, fifth disease, or chickenpox.  You have a very bad headache.  You have shortness of breath.  You have any kind of trauma, such as from a fall or a car accident. Summary  The first trimester of pregnancy is from week 1 until the end of week 13 (months 1 through 3).  To take care of yourself and your unborn baby, you will need to eat healthy meals, take medicines only if your doctor tells you to do so, and do activities that are safe for you and your baby.  Keep all follow-up visits as told by your doctor. This is important as your doctor will have to ensure that your baby is healthy and growing well. This information is not intended to replace advice given to you by your health care provider. Make sure you discuss any questions you have with your health care provider. Document Revised: 02/16/2019 Document Reviewed: 11/03/2016 Elsevier Patient Education  2020 [employer] Inc[employer]  

## 2020-08-09 LAB — COMPREHENSIVE METABOLIC PANEL
ALT: 17 IU/L (ref 0–32)
AST: 15 IU/L (ref 0–40)
Albumin/Globulin Ratio: 1.7 (ref 1.2–2.2)
Albumin: 4.7 g/dL (ref 3.8–4.8)
Alkaline Phosphatase: 89 IU/L (ref 44–121)
BUN/Creatinine Ratio: 11 (ref 9–23)
BUN: 8 mg/dL (ref 6–20)
Bilirubin Total: 0.5 mg/dL (ref 0.0–1.2)
CO2: 20 mmol/L (ref 20–29)
Calcium: 9.8 mg/dL (ref 8.7–10.2)
Chloride: 101 mmol/L (ref 96–106)
Creatinine, Ser: 0.71 mg/dL (ref 0.57–1.00)
GFR calc Af Amer: 129 mL/min/{1.73_m2} (ref >59)
GFR calc non Af Amer: 112 mL/min/{1.73_m2} (ref >59)
Globulin, Total: 2.8 g/dL (ref 1.5–4.5)
Glucose: 118 mg/dL — ABNORMAL HIGH (ref 65–99)
Potassium: 4.1 mmol/L (ref 3.5–5.2)
Sodium: 135 mmol/L (ref 134–144)
Total Protein: 7.5 g/dL (ref 6.0–8.5)

## 2020-08-09 LAB — PROTEIN / CREATININE RATIO, URINE
Creatinine, Urine: 176 mg/dL
Protein, Ur: 18.5 mg/dL
Protein/Creat Ratio: 105 mg/g creat (ref 0–200)

## 2020-08-09 LAB — AB SCR+ANTIBODY ID: Antibody Screen: POSITIVE — AB

## 2020-08-09 LAB — CBC/D/PLT+RPR+RH+ABO+RUB AB...
Basophils Absolute: 0.1 10*3/uL (ref 0.0–0.2)
Basos: 0 %
EOS (ABSOLUTE): 0.3 10*3/uL (ref 0.0–0.4)
Eos: 2 %
HCV Ab: <0.1 s/co ratio (ref 0.0–0.9)
HIV Screen 4th Generation wRfx: NONREACTIVE
Hematocrit: 43 % (ref 34.0–46.6)
Hemoglobin: 14.2 g/dL (ref 11.1–15.9)
Hepatitis B Surface Ag: NEGATIVE
Immature Grans (Abs): 0.1 10*3/uL (ref 0.0–0.1)
Immature Granulocytes: 1 %
Lymphocytes Absolute: 3.3 10*3/uL — ABNORMAL HIGH (ref 0.7–3.1)
Lymphs: 17 %
MCH: 27.5 pg (ref 26.6–33.0)
MCHC: 33 g/dL (ref 31.5–35.7)
MCV: 83 fL (ref 79–97)
Monocytes Absolute: 1.3 10*3/uL — ABNORMAL HIGH (ref 0.1–0.9)
Monocytes: 7 %
Neutrophils Absolute: 14.1 10*3/uL — ABNORMAL HIGH (ref 1.4–7.0)
Neutrophils: 73 %
Platelets: 279 10*3/uL (ref 150–450)
RBC: 5.17 x10E6/uL (ref 3.77–5.28)
RDW: 15.3 % (ref 11.7–15.4)
RPR Ser Ql: NONREACTIVE
Rh Factor: POSITIVE
Rubella Antibodies, IGG: 4.6 index (ref >0.99)
WBC: 19.1 10*3/uL — ABNORMAL HIGH (ref 3.4–10.8)

## 2020-08-09 LAB — HCV INTERPRETATION

## 2020-08-09 LAB — CULTURE, OB URINE

## 2020-08-09 LAB — URINE CULTURE, OB REFLEX

## 2020-08-12 ENCOUNTER — Encounter: Payer: Self-pay | Admitting: Obstetrics and Gynecology

## 2020-08-20 ENCOUNTER — Encounter: Payer: Self-pay | Admitting: Obstetrics and Gynecology

## 2020-09-06 ENCOUNTER — Ambulatory Visit (INDEPENDENT_AMBULATORY_CARE_PROVIDER_SITE_OTHER): Payer: 59 | Admitting: Obstetrics and Gynecology

## 2020-09-06 ENCOUNTER — Other Ambulatory Visit: Payer: Self-pay

## 2020-09-06 ENCOUNTER — Encounter: Payer: Self-pay | Admitting: Obstetrics and Gynecology

## 2020-09-06 VITALS — BP 112/73 | HR 82 | Wt 209.0 lb

## 2020-09-06 DIAGNOSIS — Z98891 History of uterine scar from previous surgery: Secondary | ICD-10-CM

## 2020-09-06 DIAGNOSIS — Z3480 Encounter for supervision of other normal pregnancy, unspecified trimester: Secondary | ICD-10-CM | POA: Diagnosis not present

## 2020-09-06 DIAGNOSIS — Z3A16 16 weeks gestation of pregnancy: Secondary | ICD-10-CM

## 2020-09-06 DIAGNOSIS — Z8759 Personal history of other complications of pregnancy, childbirth and the puerperium: Secondary | ICD-10-CM

## 2020-09-06 DIAGNOSIS — O99210 Obesity complicating pregnancy, unspecified trimester: Secondary | ICD-10-CM

## 2020-09-06 MED ORDER — BLOOD PRESSURE MONITOR KIT: 1.0000 | PACK | 0 refills | Status: DC

## 2020-09-06 NOTE — Addendum Note (Signed)
Addended by: Warden Fillers on: 09/06/2020 09:26 AM   Modules accepted: Orders

## 2020-09-06 NOTE — Progress Notes (Signed)
   PRENATAL VISIT NOTE  Subjective:  Marcia Bryant is a 34 y.o. [phone] at [redacted]w[redacted]d being seen today for ongoing prenatal care.  She is currently monitored for the following issues for this low-risk pregnancy and has BMI 33.0-33.9,adult; Obesity in pregnancy; History of pre-eclampsia; History of shoulder dystocia in prior pregnancy; Umbilical hernia; failed early 1h (154); Symptomatic mammary hypertrophy; Chronic bilateral thoracic back pain; Neck pain; Supervision of other normal pregnancy, antepartum; [redacted] weeks gestation of pregnancy; History of cesarean delivery; Back pain affecting pregnancy in first trimester; and [redacted] weeks gestation of pregnancy on their problem list.  Patient doing well with no acute concerns today. She reports no complaints.  Contractions: Not present.  .  Movement: Absent. Denies leaking of fluid.   The following portions of the patient's history were reviewed and updated as appropriate: allergies, current medications, past family history, past medical history, past social history, past surgical history and problem list. Problem list updated.  Objective:   Vitals:   09/06/20 0846  BP: 112/73  Pulse: 82  Weight: 209 lb (94.8 kg)    Fetal Status: Fetal Heart Rate (bpm): 150   Movement: Absent     General:  Alert, oriented and cooperative. Patient is in no acute distress.  Skin: Skin is warm and dry. No rash noted.   Cardiovascular: Normal heart rate noted  Respiratory: Normal respiratory effort, no problems with respiration noted  Abdomen: Soft, gravid, appropriate for gestational age.  Pain/Pressure: Absent     Pelvic: Cervical exam deferred        Extremities: Normal range of motion.  Edema: None  Mental Status:  Normal mood and affect. Normal behavior. Normal judgment and thought content.   Assessment and Plan:  Pregnancy: [id number] at [redacted]w[redacted]d  1. Supervision of other normal pregnancy, antepartum Anatomy scan previously scheduled Recheck type and screen due  to irregular antibody - AFP, Serum, Open Spina Bifida - Blood Pressure Monitor KIT; 1 Device by Does not apply route once a week. To be monitored Regularly at home.  Dispense: 1 kit; Refill: 0 - Type and screen; Future  2. [redacted] weeks gestation of pregnancy   3. History of pre-eclampsia BP WNL  4. History of shoulder dystocia in prior pregnancy   5. History of cesarean delivery Pt desires repeat and BTL  6. Obesity in pregnancy   Preterm labor symptoms and general obstetric precautions including but not limited to vaginal bleeding, contractions, leaking of fluid and fetal movement were reviewed in detail with the patient.  Please refer to After Visit Summary for other counseling recommendations.   Return in about 4 weeks (around 10/04/2020) for ROB, in person.   Lynnda Shields, MD

## 2020-09-06 NOTE — Progress Notes (Signed)
ROB [redacted]w[redacted]d  AFP Today.  CC: None   B/P cuff sent [facility].

## 2020-09-06 NOTE — Patient Instructions (Signed)

## 2020-09-08 LAB — AFP, SERUM, OPEN SPINA BIFIDA
AFP MoM: 0.96
AFP Value: 28.9 ng/mL
Gest. Age on Collection Date: 16 weeks
Maternal Age At EDD: 33.5 yr
OSBR Risk 1 IN: 10000
Test Results:: NEGATIVE
Weight: 209 [lb_av]

## 2020-09-09 LAB — AB SCR+ANTIBODY ID: Antibody Screen: POSITIVE — AB

## 2020-09-09 LAB — ANTIBODY SCREEN

## 2020-09-24 ENCOUNTER — Ambulatory Visit: Payer: 59 | Admitting: *Deleted

## 2020-09-24 ENCOUNTER — Encounter: Payer: Self-pay | Admitting: *Deleted

## 2020-09-24 ENCOUNTER — Other Ambulatory Visit: Payer: Self-pay

## 2020-09-24 ENCOUNTER — Ambulatory Visit: Payer: 59 | Attending: Obstetrics and Gynecology

## 2020-09-24 ENCOUNTER — Other Ambulatory Visit: Payer: Self-pay | Admitting: *Deleted

## 2020-09-24 DIAGNOSIS — Z3480 Encounter for supervision of other normal pregnancy, unspecified trimester: Secondary | ICD-10-CM

## 2020-09-24 DIAGNOSIS — Z362 Encounter for other antenatal screening follow-up: Secondary | ICD-10-CM

## 2020-09-26 ENCOUNTER — Telehealth: Payer: Self-pay

## 2020-09-26 NOTE — Telephone Encounter (Signed)
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 No answer or voice mail to leave a message.

## 2020-09-26 NOTE — Telephone Encounter (Signed)
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

## 2020-10-07 ENCOUNTER — Encounter: Payer: 59 | Admitting: Obstetrics & Gynecology

## 2020-10-11 ENCOUNTER — Ambulatory Visit (INDEPENDENT_AMBULATORY_CARE_PROVIDER_SITE_OTHER): Payer: Medicaid Other | Admitting: Obstetrics and Gynecology

## 2020-10-11 ENCOUNTER — Other Ambulatory Visit: Payer: Self-pay

## 2020-10-11 VITALS — BP 109/72 | HR 83 | Wt 212.1 lb

## 2020-10-11 DIAGNOSIS — Z3A20 20 weeks gestation of pregnancy: Secondary | ICD-10-CM

## 2020-10-11 DIAGNOSIS — O4402 Placenta previa specified as without hemorrhage, second trimester: Secondary | ICD-10-CM | POA: Insufficient documentation

## 2020-10-11 DIAGNOSIS — Z3480 Encounter for supervision of other normal pregnancy, unspecified trimester: Secondary | ICD-10-CM

## 2020-10-11 DIAGNOSIS — K429 Umbilical hernia without obstruction or gangrene: Secondary | ICD-10-CM

## 2020-10-11 DIAGNOSIS — Z98891 History of uterine scar from previous surgery: Secondary | ICD-10-CM

## 2020-10-11 DIAGNOSIS — Z8759 Personal history of other complications of pregnancy, childbirth and the puerperium: Secondary | ICD-10-CM

## 2020-10-11 NOTE — Patient Instructions (Signed)

## 2020-10-11 NOTE — Progress Notes (Signed)
   PRENATAL VISIT NOTE  Subjective:  Marcia Bryant is a 34 y.o. [phone] at [redacted]w[redacted]d being seen today for ongoing prenatal care.  She is currently monitored for the following issues for this high-risk pregnancy and has BMI 33.0-33.9,adult; Obesity in pregnancy; History of pre-eclampsia; History of shoulder dystocia in prior pregnancy; Umbilical hernia; failed early 1h (154); Symptomatic mammary hypertrophy; Chronic bilateral thoracic back pain; Neck pain; Supervision of other normal pregnancy, antepartum; [redacted] weeks gestation of pregnancy; History of cesarean delivery; Back pain affecting pregnancy in first trimester; [redacted] weeks gestation of pregnancy; [redacted] weeks gestation of pregnancy; and Placenta previa antepartum in second trimester on their problem list.  Patient doing well with no acute concerns today. She reports no complaints.  Contractions: Not present. Vag. Bleeding: None.  Movement: Present. Denies leaking of fluid.   Pt doing well.  No complaints.  She denies any vaginal bleeding.  The following portions of the patient's history were reviewed and updated as appropriate: allergies, current medications, past family history, past medical history, past social history, past surgical history and problem list. Problem list updated.  Objective:   Vitals:   10/11/20 0850  BP: 109/72  Pulse: 83  Weight: 212 lb 1.6 oz (96.2 kg)    Fetal Status: Fetal Heart Rate (bpm): 148 Fundal Height: 20 cm Movement: Present     General:  Alert, oriented and cooperative. Patient is in no acute distress.  Skin: Skin is warm and dry. No rash noted.   Cardiovascular: Normal heart rate noted  Respiratory: Normal respiratory effort, no problems with respiration noted  Abdomen: Soft, gravid, appropriate for gestational age.  Pain/Pressure: Absent     Pelvic: Cervical exam deferred        Extremities: Normal range of motion.  Edema: None  Mental Status:  Normal mood and affect. Normal behavior. Normal judgment  and thought content.   Assessment and Plan:  Pregnancy: [id number] at [redacted]w[redacted]d  1. History of pre-eclampsia Blood pressure normal  2. History of shoulder dystocia in prior pregnancy   3. Supervision of other normal pregnancy, antepartum   4. History of cesarean delivery Desires repeat c/s with BTL  5. [redacted] weeks gestation of pregnancy   6. Placenta previa antepartum in second trimester Placental edge at cervical os, pt has repeat scan on 10/25/20  7. Umbilical hernia without obstruction and without gangrene stable  Preterm labor symptoms and general obstetric precautions including but not limited to vaginal bleeding, contractions, leaking of fluid and fetal movement were reviewed in detail with the patient.  Please refer to After Visit Summary for other counseling recommendations.   Return in about 4 weeks (around 11/08/2020) for ROB, in person.   Mariel Aloe, MD

## 2020-10-25 ENCOUNTER — Other Ambulatory Visit: Payer: Self-pay | Admitting: *Deleted

## 2020-10-25 ENCOUNTER — Ambulatory Visit: Payer: 59 | Admitting: *Deleted

## 2020-10-25 ENCOUNTER — Ambulatory Visit: Payer: 59 | Attending: Obstetrics and Gynecology

## 2020-10-25 ENCOUNTER — Encounter: Payer: Self-pay | Admitting: *Deleted

## 2020-10-25 ENCOUNTER — Other Ambulatory Visit: Payer: Self-pay

## 2020-10-25 DIAGNOSIS — Z148 Genetic carrier of other disease: Secondary | ICD-10-CM

## 2020-10-25 DIAGNOSIS — R638 Other symptoms and signs concerning food and fluid intake: Secondary | ICD-10-CM

## 2020-10-25 DIAGNOSIS — Z3480 Encounter for supervision of other normal pregnancy, unspecified trimester: Secondary | ICD-10-CM | POA: Diagnosis not present

## 2020-10-25 DIAGNOSIS — O4442 Low lying placenta NOS or without hemorrhage, second trimester: Secondary | ICD-10-CM

## 2020-10-25 DIAGNOSIS — Z3A22 22 weeks gestation of pregnancy: Secondary | ICD-10-CM | POA: Diagnosis not present

## 2020-10-25 DIAGNOSIS — O09292 Supervision of pregnancy with other poor reproductive or obstetric history, second trimester: Secondary | ICD-10-CM

## 2020-10-25 DIAGNOSIS — O99612 Diseases of the digestive system complicating pregnancy, second trimester: Secondary | ICD-10-CM | POA: Diagnosis not present

## 2020-10-25 DIAGNOSIS — O99212 Obesity complicating pregnancy, second trimester: Secondary | ICD-10-CM | POA: Diagnosis not present

## 2020-10-25 DIAGNOSIS — E669 Obesity, unspecified: Secondary | ICD-10-CM | POA: Diagnosis not present

## 2020-10-25 DIAGNOSIS — Z362 Encounter for other antenatal screening follow-up: Secondary | ICD-10-CM | POA: Diagnosis not present

## 2020-10-25 DIAGNOSIS — O34219 Maternal care for unspecified type scar from previous cesarean delivery: Secondary | ICD-10-CM | POA: Diagnosis not present

## 2020-10-25 DIAGNOSIS — K429 Umbilical hernia without obstruction or gangrene: Secondary | ICD-10-CM | POA: Diagnosis not present

## 2020-11-07 ENCOUNTER — Other Ambulatory Visit: Payer: Self-pay

## 2020-11-07 ENCOUNTER — Ambulatory Visit (INDEPENDENT_AMBULATORY_CARE_PROVIDER_SITE_OTHER): Payer: Medicaid Other | Admitting: Obstetrics & Gynecology

## 2020-11-07 ENCOUNTER — Encounter: Payer: Self-pay | Admitting: Obstetrics & Gynecology

## 2020-11-07 DIAGNOSIS — Z3480 Encounter for supervision of other normal pregnancy, unspecified trimester: Secondary | ICD-10-CM

## 2020-11-07 NOTE — Patient Instructions (Signed)

## 2020-11-07 NOTE — Progress Notes (Signed)
   PRENATAL VISIT NOTE  Subjective:  Marcia Bryant is a 34 y.o. [phone] at [redacted]w[redacted]d being seen today for ongoing prenatal care.  She is currently monitored for the following issues for this high-risk pregnancy and has BMI 33.0-33.9,adult; Obesity in pregnancy; History of pre-eclampsia; History of shoulder dystocia in prior pregnancy; Umbilical hernia; failed early 1h (154); Symptomatic mammary hypertrophy; Chronic bilateral thoracic back pain; Neck pain; Supervision of other normal pregnancy, antepartum; [redacted] weeks gestation of pregnancy; History of cesarean delivery; Back pain affecting pregnancy in first trimester; [redacted] weeks gestation of pregnancy; [redacted] weeks gestation of pregnancy; and Placenta previa antepartum in second trimester on their problem list.  Patient reports no complaints.  Contractions: Not present. Vag. Bleeding: None.  Movement: Present. Denies leaking of fluid.   The following portions of the patient's history were reviewed and updated as appropriate: allergies, current medications, past family history, past medical history, past social history, past surgical history and problem list.   Objective:   Vitals:   11/07/20 0838  BP: 117/72  Pulse: 79  Weight: 212 lb (96.2 kg)    Fetal Status: Fetal Heart Rate (bpm): 154   Movement: Present     General:  Alert, oriented and cooperative. Patient is in no acute distress.  Skin: Skin is warm and dry. No rash noted.   Cardiovascular: Normal heart rate noted  Respiratory: Normal respiratory effort, no problems with respiration noted  Abdomen: Soft, gravid, appropriate for gestational age.  Pain/Pressure: Absent     Pelvic: Cervical exam deferred        Extremities: Normal range of motion.     Mental Status: Normal mood and affect. Normal behavior. Normal judgment and thought content.   Assessment and Plan:  Pregnancy: [id number] at [redacted]w[redacted]d 1. Supervision of other normal pregnancy, antepartum Has f/u US in 6 weeks  Preterm labor  symptoms and general obstetric precautions including but not limited to vaginal bleeding, contractions, leaking of fluid and fetal movement were reviewed in detail with the patient. Please refer to After Visit Summary for other counseling recommendations.   Return in about 4 weeks (around 12/05/2020).  Future Appointments  Date Time Provider Department Center  12/23/2020  9:00 AM [facility] NURSE [facility]  12/23/2020  9:15 AM [facility]2 [facility]    Scheryl Darter, MD

## 2020-11-07 NOTE — Progress Notes (Signed)
Pt presents for ROB reports no c/o today.

## 2020-12-02 ENCOUNTER — Other Ambulatory Visit: Payer: Medicaid Other

## 2020-12-02 ENCOUNTER — Encounter: Payer: Self-pay | Admitting: Obstetrics and Gynecology

## 2020-12-02 ENCOUNTER — Other Ambulatory Visit: Payer: Self-pay

## 2020-12-02 ENCOUNTER — Ambulatory Visit (INDEPENDENT_AMBULATORY_CARE_PROVIDER_SITE_OTHER): Payer: Medicaid Other | Admitting: Obstetrics and Gynecology

## 2020-12-02 VITALS — BP 125/77 | HR 90 | Wt 211.0 lb

## 2020-12-02 DIAGNOSIS — O99210 Obesity complicating pregnancy, unspecified trimester: Secondary | ICD-10-CM

## 2020-12-02 DIAGNOSIS — Z3480 Encounter for supervision of other normal pregnancy, unspecified trimester: Secondary | ICD-10-CM

## 2020-12-02 DIAGNOSIS — Z23 Encounter for immunization: Secondary | ICD-10-CM

## 2020-12-02 DIAGNOSIS — Z98891 History of uterine scar from previous surgery: Secondary | ICD-10-CM

## 2020-12-02 DIAGNOSIS — O4402 Placenta previa specified as without hemorrhage, second trimester: Secondary | ICD-10-CM

## 2020-12-02 DIAGNOSIS — O24419 Gestational diabetes mellitus in pregnancy, unspecified control: Secondary | ICD-10-CM

## 2020-12-02 DIAGNOSIS — Z8759 Personal history of other complications of pregnancy, childbirth and the puerperium: Secondary | ICD-10-CM

## 2020-12-02 NOTE — Progress Notes (Signed)
ROB/GTT c/o pain and pressure around navel.

## 2020-12-02 NOTE — Addendum Note (Signed)
Addended by: Maretta Bees on: 12/02/2020 09:56 AM   Modules accepted: Orders

## 2020-12-02 NOTE — Progress Notes (Signed)
   PRENATAL VISIT NOTE  Subjective:  Marcia Bryant is a 35 y.o. [phone] at [redacted]w[redacted]d being seen today for ongoing prenatal care.  She is currently monitored for the following issues for this high-risk pregnancy and has BMI 33.0-33.9,adult; Obesity in pregnancy; History of pre-eclampsia; History of shoulder dystocia in prior pregnancy; Umbilical hernia; failed early 1h (154); Symptomatic mammary hypertrophy; Chronic bilateral thoracic back pain; Neck pain; Supervision of other normal pregnancy, antepartum; History of cesarean delivery; Back pain affecting pregnancy in first trimester; and Placenta previa antepartum in second trimester on their problem list.  Patient reports occasional pelvic pressure and occasional pain at umbilical hernia site.  Contractions: Not present. Vag. Bleeding: None.  Movement: Present. Denies leaking of fluid.   The following portions of the patient's history were reviewed and updated as appropriate: allergies, current medications, past family history, past medical history, past social history, past surgical history and problem list.   Objective:   Vitals:   12/02/20 0924  BP: 125/77  Pulse: 90  Weight: 211 lb (95.7 kg)    Fetal Status: Fetal Heart Rate (bpm): 154 Fundal Height: 29 cm Movement: Present     General:  Alert, oriented and cooperative. Patient is in no acute distress.  Skin: Skin is warm and dry. No rash noted.   Cardiovascular: Normal heart rate noted  Respiratory: Normal respiratory effort, no problems with respiration noted  Abdomen: Soft, gravid, appropriate for gestational age.  Pain/Pressure: Present     Pelvic: Cervical exam deferred        Extremities: Normal range of motion.  Edema: None  Mental Status: Normal mood and affect. Normal behavior. Normal judgment and thought content.   Assessment and Plan:  Pregnancy: G5P3013 at [redacted]w[redacted]d 1. Supervision of other normal pregnancy, antepartum Patient is doing well Third trimester labs and  glucola today Patient desires BTL- form signed today Umbilical hernia site soft and non tender today- precautions reviewed with the patient  2. Placenta previa antepartum in second trimester Resolved on last scan Follow up growth on 2/14  3. History of cesarean delivery Patient desires repeat c-section. Will be scheduled at 39 weeks with BTL  4. History of pre-eclampsia Normotensive No si/sx Continue ASA  5. Obesity in pregnancy   Preterm labor symptoms and general obstetric precautions including but not limited to vaginal bleeding, contractions, leaking of fluid and fetal movement were reviewed in detail with the patient. Please refer to After Visit Summary for other counseling recommendations.   Return in about 2 weeks (around 12/16/2020) for in person, ROB, High risk.  Future Appointments  Date Time Provider Department Center  12/23/2020  9:00 AM [facility] NURSE [facility]  12/23/2020  9:15 AM [facility] US2 [facility]    Catalina Antigua, MD

## 2020-12-03 LAB — CBC
Hematocrit: 35.9 % (ref 34.0–46.6)
Hemoglobin: 12.2 g/dL (ref 11.1–15.9)
MCH: 27.6 pg (ref 26.6–33.0)
MCHC: 34 g/dL (ref 31.5–35.7)
MCV: 81 fL (ref 79–97)
Platelets: 269 10*3/uL (ref 150–450)
RBC: 4.42 x10E6/uL (ref 3.77–5.28)
RDW: 13.8 % (ref 11.7–15.4)
WBC: 14.4 10*3/uL — ABNORMAL HIGH (ref 3.4–10.8)

## 2020-12-03 LAB — HIV ANTIBODY (ROUTINE TESTING W REFLEX): HIV Screen 4th Generation wRfx: NONREACTIVE

## 2020-12-03 LAB — GLUCOSE TOLERANCE, 2 HOURS W/ 1HR
Glucose, 1 hour: 225 mg/dL — ABNORMAL HIGH (ref 65–179)
Glucose, 2 hour: 240 mg/dL — ABNORMAL HIGH (ref 65–152)
Glucose, Fasting: 115 mg/dL — ABNORMAL HIGH (ref 65–91)

## 2020-12-03 LAB — RPR: RPR Ser Ql: NONREACTIVE

## 2020-12-04 ENCOUNTER — Other Ambulatory Visit: Payer: Self-pay

## 2020-12-04 DIAGNOSIS — O24419 Gestational diabetes mellitus in pregnancy, unspecified control: Secondary | ICD-10-CM | POA: Insufficient documentation

## 2020-12-04 MED ORDER — GLUCOSE BLOOD VI STRP: ORAL_STRIP | 12 refills | Status: DC

## 2020-12-04 MED ORDER — ACCU-CHEK SOFTCLIX LANCETS MISC: 0 refills | Status: DC

## 2020-12-04 MED ORDER — ACCU-CHEK GUIDE W/DEVICE KIT: 1.0000 | PACK | Freq: Four times a day (QID) | 0 refills | Status: DC

## 2020-12-04 NOTE — Telephone Encounter (Signed)
Referral sent to [facility]

## 2020-12-04 NOTE — Addendum Note (Signed)
Addended by: Catalina Antigua on: 12/04/2020 07:35 AM   Modules accepted: Orders

## 2020-12-06 ENCOUNTER — Ambulatory Visit: Payer: Medicaid Other | Admitting: Registered"

## 2020-12-09 ENCOUNTER — Encounter: Payer: Medicaid Other | Attending: Obstetrics and Gynecology | Admitting: Registered"

## 2020-12-09 ENCOUNTER — Other Ambulatory Visit: Payer: Self-pay

## 2020-12-09 ENCOUNTER — Encounter: Payer: Self-pay | Admitting: Registered"

## 2020-12-09 DIAGNOSIS — O24419 Gestational diabetes mellitus in pregnancy, unspecified control: Secondary | ICD-10-CM | POA: Diagnosis present

## 2020-12-09 DIAGNOSIS — Z3A00 Weeks of gestation of pregnancy not specified: Secondary | ICD-10-CM | POA: Diagnosis not present

## 2020-12-09 NOTE — Progress Notes (Signed)
Patient was seen on 12/09/20 for Gestational Diabetes self-management education at [facility]. Pt states prior GDM/The following learning objectives were met by the patient during this course:   States the definition of Gestational Diabetes  States why dietary management is important in controlling blood glucose  Describes the effects each nutrient has on blood glucose levels  Demonstrates ability to create a balanced meal plan  Demonstrates carbohydrate counting   States when to check blood glucose levels  Demonstrates proper blood glucose monitoring techniques  States the effect of stress and exercise on blood glucose levels  States the importance of limiting caffeine and abstaining from alcohol and smoking  Blood glucose monitor given: [employer] #[id number] Exp: 12/29/2021 CBG: 185 mg/dL  Patient instructed to monitor glucose levels: FBS: 60 - <95; 1 hour: <140; 2 hour: <120  Patient received handouts:  Nutrition Diabetes and Pregnancy, including carb counting list  Patient will be seen for follow-up as needed.

## 2020-12-16 ENCOUNTER — Telehealth (INDEPENDENT_AMBULATORY_CARE_PROVIDER_SITE_OTHER): Payer: Medicaid Other | Admitting: Obstetrics and Gynecology

## 2020-12-16 ENCOUNTER — Encounter: Payer: Self-pay | Admitting: Obstetrics and Gynecology

## 2020-12-16 VITALS — BP 134/87 | HR 98

## 2020-12-16 DIAGNOSIS — O24415 Gestational diabetes mellitus in pregnancy, controlled by oral hypoglycemic drugs: Secondary | ICD-10-CM

## 2020-12-16 DIAGNOSIS — Z8759 Personal history of other complications of pregnancy, childbirth and the puerperium: Secondary | ICD-10-CM

## 2020-12-16 DIAGNOSIS — Z3480 Encounter for supervision of other normal pregnancy, unspecified trimester: Secondary | ICD-10-CM

## 2020-12-16 DIAGNOSIS — Z98891 History of uterine scar from previous surgery: Secondary | ICD-10-CM

## 2020-12-16 DIAGNOSIS — O24419 Gestational diabetes mellitus in pregnancy, unspecified control: Secondary | ICD-10-CM

## 2020-12-16 DIAGNOSIS — E669 Obesity, unspecified: Secondary | ICD-10-CM

## 2020-12-16 DIAGNOSIS — O34219 Maternal care for unspecified type scar from previous cesarean delivery: Secondary | ICD-10-CM

## 2020-12-16 DIAGNOSIS — Z3A30 30 weeks gestation of pregnancy: Secondary | ICD-10-CM

## 2020-12-16 DIAGNOSIS — O99210 Obesity complicating pregnancy, unspecified trimester: Secondary | ICD-10-CM

## 2020-12-16 DIAGNOSIS — O99213 Obesity complicating pregnancy, third trimester: Secondary | ICD-10-CM

## 2020-12-16 MED ORDER — METFORMIN HCL 500 MG PO TABS: 500.0000 mg | ORAL_TABLET | Freq: Two times a day (BID) | ORAL | 5 refills | Status: DC

## 2020-12-16 NOTE — Progress Notes (Signed)
OBSTETRICS PRENATAL VIRTUAL VISIT ENCOUNTER NOTE  Provider location: [facility] [place]   I connected with Marcia Bryant on 12/16/20 at  9:00 AM EST by MyChart Video Encounter at home and verified that I am speaking with the correct person using two identifiers.   I discussed the limitations, risks, security and privacy concerns of performing an evaluation and management service virtually and the availability of in person appointments. I also discussed with the patient that there may be a patient responsible charge related to this service. The patient expressed understanding and agreed to proceed. Subjective:  Marcia Bryant is a 35 y.o. [phone] at [redacted]w[redacted]d being seen today for ongoing prenatal care.  She is currently monitored for the following issues for this high-risk pregnancy and has BMI 33.0-33.9,adult; Obesity in pregnancy; History of pre-eclampsia; History of shoulder dystocia in prior pregnancy; Umbilical hernia; failed early 1h (154); Symptomatic mammary hypertrophy; Chronic bilateral thoracic back pain; Neck pain; Supervision of other normal pregnancy, antepartum; History of cesarean delivery; Back pain affecting pregnancy in first trimester; Placenta previa antepartum in second trimester; and Gestational diabetes mellitus (GDM) affecting pregnancy, antepartum on their problem list.  Patient reports no complaints.  Contractions: Not present. Vag. Bleeding: None.  Movement: Present. Denies any leaking of fluid.   The following portions of the patient's history were reviewed and updated as appropriate: allergies, current medications, past family history, past medical history, past social history, past surgical history and problem list.   Objective:   Vitals:   12/16/20 0856 12/16/20 0912  BP: (!) 138/94 134/87  Pulse:  98    Fetal Status:     Movement: Present     General:  Alert, oriented and cooperative. Patient is in no acute distress.   Respiratory: Normal respiratory effort, no problems with respiration noted  Mental Status: Normal mood and affect. Normal behavior. Normal judgment and thought content.  Rest of physical exam deferred due to type of encounter  Imaging: No results found.  Assessment and Plan:  Pregnancy: [id number] at [redacted]w[redacted]d 1. Supervision of other normal pregnancy, antepartum Patient is doing well without complaints  2. Gestational diabetes mellitus (GDM) affecting pregnancy, antepartum Patient with new diabetes of diabetes  CBGs reviewed and all fasting values elevated and 50% of postprandial in range with values as high as 131 Will start metformin 500 mg BID  3. History of pre-eclampsia No Si/sx of preeclampsia  4. Obesity in pregnancy Continue ASA  5. History of shoulder dystocia in prior pregnancy   6. History of cesarean delivery Patient scheduled for repeat c-section with BTL at 39 weeks  Preterm labor symptoms and general obstetric precautions including but not limited to vaginal bleeding, contractions, leaking of fluid and fetal movement were reviewed in detail with the patient. I discussed the assessment and treatment plan with the patient. The patient was provided an opportunity to ask questions and all were answered. The patient agreed with the plan and demonstrated an understanding of the instructions. The patient was advised to call back or seek an in-person office evaluation/go to [facility] at [facility] for any urgent or concerning symptoms. Please refer to After Visit Summary for other counseling recommendations.   I provided 15 minutes of face-to-face time during this encounter.  No follow-ups on file.  Future Appointments  Date Time Provider Department Center  12/23/2020  9:00 AM [facility] NURSE [facility]  12/23/2020  9:15 AM [facility]2 [facility]    Catalina Antigua, MD  [facility] [place]

## 2020-12-18 ENCOUNTER — Other Ambulatory Visit: Payer: Self-pay | Admitting: *Deleted

## 2020-12-18 MED ORDER — ACCU-CHEK SOFTCLIX LANCETS MISC: 0 refills | Status: DC

## 2020-12-18 MED ORDER — ACCU-CHEK GUIDE W/DEVICE KIT: 1.0000 | PACK | Freq: Four times a day (QID) | 0 refills | Status: DC

## 2020-12-18 NOTE — Progress Notes (Signed)
Diabetic supplies resent per pharmacy for coverage needs.

## 2020-12-23 ENCOUNTER — Other Ambulatory Visit: Payer: Self-pay | Admitting: *Deleted

## 2020-12-23 ENCOUNTER — Encounter: Payer: Self-pay | Admitting: *Deleted

## 2020-12-23 ENCOUNTER — Ambulatory Visit: Payer: Medicaid Other | Attending: Obstetrics and Gynecology

## 2020-12-23 ENCOUNTER — Other Ambulatory Visit: Payer: Self-pay

## 2020-12-23 ENCOUNTER — Ambulatory Visit: Payer: Medicaid Other | Admitting: *Deleted

## 2020-12-23 DIAGNOSIS — O09293 Supervision of pregnancy with other poor reproductive or obstetric history, third trimester: Secondary | ICD-10-CM

## 2020-12-23 DIAGNOSIS — O24419 Gestational diabetes mellitus in pregnancy, unspecified control: Secondary | ICD-10-CM

## 2020-12-23 DIAGNOSIS — Z148 Genetic carrier of other disease: Secondary | ICD-10-CM

## 2020-12-23 DIAGNOSIS — E669 Obesity, unspecified: Secondary | ICD-10-CM | POA: Diagnosis not present

## 2020-12-23 DIAGNOSIS — O352XX0 Maternal care for (suspected) hereditary disease in fetus, not applicable or unspecified: Secondary | ICD-10-CM

## 2020-12-23 DIAGNOSIS — O4443 Low lying placenta NOS or without hemorrhage, third trimester: Secondary | ICD-10-CM

## 2020-12-23 DIAGNOSIS — O99213 Obesity complicating pregnancy, third trimester: Secondary | ICD-10-CM | POA: Diagnosis not present

## 2020-12-23 DIAGNOSIS — Z3A31 31 weeks gestation of pregnancy: Secondary | ICD-10-CM | POA: Diagnosis not present

## 2020-12-23 DIAGNOSIS — O99613 Diseases of the digestive system complicating pregnancy, third trimester: Secondary | ICD-10-CM

## 2020-12-23 DIAGNOSIS — K429 Umbilical hernia without obstruction or gangrene: Secondary | ICD-10-CM

## 2020-12-23 DIAGNOSIS — O24415 Gestational diabetes mellitus in pregnancy, controlled by oral hypoglycemic drugs: Secondary | ICD-10-CM

## 2020-12-23 DIAGNOSIS — R638 Other symptoms and signs concerning food and fluid intake: Secondary | ICD-10-CM | POA: Insufficient documentation

## 2020-12-23 DIAGNOSIS — Z3480 Encounter for supervision of other normal pregnancy, unspecified trimester: Secondary | ICD-10-CM

## 2021-01-03 ENCOUNTER — Ambulatory Visit: Payer: Medicaid Other | Admitting: *Deleted

## 2021-01-03 ENCOUNTER — Encounter: Payer: Self-pay | Admitting: *Deleted

## 2021-01-03 ENCOUNTER — Other Ambulatory Visit: Payer: Self-pay

## 2021-01-03 ENCOUNTER — Other Ambulatory Visit: Payer: Self-pay | Admitting: *Deleted

## 2021-01-03 ENCOUNTER — Ambulatory Visit: Payer: Medicaid Other | Attending: Obstetrics

## 2021-01-03 ENCOUNTER — Ambulatory Visit (INDEPENDENT_AMBULATORY_CARE_PROVIDER_SITE_OTHER): Payer: Medicaid Other | Admitting: Obstetrics and Gynecology

## 2021-01-03 VITALS — BP 115/71 | HR 85 | Wt 210.6 lb

## 2021-01-03 DIAGNOSIS — O24419 Gestational diabetes mellitus in pregnancy, unspecified control: Secondary | ICD-10-CM

## 2021-01-03 DIAGNOSIS — O24415 Gestational diabetes mellitus in pregnancy, controlled by oral hypoglycemic drugs: Secondary | ICD-10-CM | POA: Diagnosis not present

## 2021-01-03 DIAGNOSIS — Z3A32 32 weeks gestation of pregnancy: Secondary | ICD-10-CM

## 2021-01-03 DIAGNOSIS — Z3480 Encounter for supervision of other normal pregnancy, unspecified trimester: Secondary | ICD-10-CM

## 2021-01-03 DIAGNOSIS — Z98891 History of uterine scar from previous surgery: Secondary | ICD-10-CM

## 2021-01-03 DIAGNOSIS — Z8759 Personal history of other complications of pregnancy, childbirth and the puerperium: Secondary | ICD-10-CM

## 2021-01-03 DIAGNOSIS — O99210 Obesity complicating pregnancy, unspecified trimester: Secondary | ICD-10-CM

## 2021-01-03 MED ORDER — BLOOD PRESSURE MONITOR KIT: 1.0000 | PACK | Freq: Every day | 0 refills | Status: DC

## 2021-01-03 NOTE — Progress Notes (Signed)
Patient presents for ROB. Patient has her glucose log. Her fasting blood glucose this am was 88. Patient has no concerns today.

## 2021-01-03 NOTE — Progress Notes (Signed)
 PRENATAL VISIT NOTE  Subjective:  Marcia Bryant is a 34 y.o. G5P3013 at [redacted]w[redacted]d being seen today for ongoing prenatal care.  She is currently monitored for the following issues for this high-risk pregnancy and has BMI 33.0-33.9,adult; Obesity in pregnancy; History of pre-eclampsia; History of shoulder dystocia in prior pregnancy; Umbilical hernia; failed early 1h (154); Symptomatic mammary hypertrophy; Chronic bilateral thoracic back pain; Neck pain; Supervision of other normal pregnancy, antepartum; History of cesarean delivery; Back pain affecting pregnancy in first trimester; Placenta previa antepartum in second trimester; and Gestational diabetes mellitus (GDM) affecting pregnancy, antepartum on their problem list.  Patient reports fatigue, pain at hernia site. Contractions: Not present. Vag. Bleeding: None.  Movement: Present. Denies leaking of fluid.   Blood sugar log reviewed. She is taking metformin 500mg BID. Has met with diabetic educator and has adjusted diet. About 60% controlled, notable improvement in sugars in past 5-7 days.   Hernia causing her pain. Wondering if this can be fixed at time of c section.   The following portions of the patient's history were reviewed and updated as appropriate: allergies, current medications, past family history, past medical history, past social history, past surgical history and problem list.   Objective:   Vitals:   01/03/21 0858  BP: 115/71  Pulse: 85  Weight: 210 lb 9.6 oz (95.5 kg)    Fetal Status: Fetal Heart Rate (bpm): 150   Movement: Present     General:  Alert, oriented and cooperative. Patient is in no acute distress.  Skin: Skin is warm and dry. No rash noted.   Cardiovascular: Normal heart rate noted  Respiratory: Normal respiratory effort, no problems with respiration noted  Abdomen: Soft, gravid, appropriate for gestational age. Large, reduceable umbilical hernia  Pain/Pressure: Absent     Pelvic: Cervical exam  deferred        Extremities: Normal range of motion.  Edema: None  Mental Status: Normal mood and affect. Normal behavior. Normal judgment and thought content.   Assessment and Plan:  Pregnancy: G5P3013 at [redacted]w[redacted]d 1. Supervision of other normal pregnancy, antepartum -doing well overall. Will follow up in 2 weeks. Desires FMLA letter at that visit.   2. Gestational diabetes mellitus (GDM) affecting pregnancy, antepartum -on metformin 500 mg BID. Not fully controlled. Will increase to 1000mg BID. Patient tolerating medication well and agrees with plan.   3. History of pre-eclampsia ?Elevated BP at virtual visit last week. Normotensive today. Reviewed past vitals and has been normotensive. Will monitor closely. No reported signs/symptoms of PEC.   -BP kit re sent to pharmacy   4. Obesity in pregnancy   5. History of cesarean delivery -desires repair of umbilical hernia at time of c section, will send message to surgeon to see their input on this.   6. [redacted] weeks gestation of pregnancy   Preterm labor symptoms and general obstetric precautions including but not limited to vaginal bleeding, contractions, leaking of fluid and fetal movement were reviewed in detail with the patient. Please refer to After Visit Summary for other counseling recommendations.   Return in about 2 weeks (around 01/17/2021) for any provider.  Future Appointments  Date Time Provider Department Center  01/03/2021  3:15 PM [facility] NURSE [facility]  01/03/2021  3:30 PM [facility]3 [facility]  01/10/2021  8:30 AM [facility]  01/10/2021  8:45 AM [facility] US5 [facility]  01/17/2021  8:30 AM [facility]  01/17/2021  8:45 AM [facility]5 [facility]    01/31/2021  8:30 AM [facility]  01/31/2021  8:45 AM [facility] US5 [facility]    Julia M Marsala, MD 

## 2021-01-10 ENCOUNTER — Other Ambulatory Visit: Payer: Self-pay

## 2021-01-10 ENCOUNTER — Ambulatory Visit: Payer: Medicaid Other | Attending: Obstetrics

## 2021-01-10 ENCOUNTER — Ambulatory Visit: Payer: Medicaid Other | Admitting: *Deleted

## 2021-01-10 ENCOUNTER — Encounter: Payer: Self-pay | Admitting: *Deleted

## 2021-01-10 DIAGNOSIS — O24419 Gestational diabetes mellitus in pregnancy, unspecified control: Secondary | ICD-10-CM

## 2021-01-10 DIAGNOSIS — Z3480 Encounter for supervision of other normal pregnancy, unspecified trimester: Secondary | ICD-10-CM | POA: Insufficient documentation

## 2021-01-10 DIAGNOSIS — O24415 Gestational diabetes mellitus in pregnancy, controlled by oral hypoglycemic drugs: Secondary | ICD-10-CM | POA: Diagnosis not present

## 2021-01-14 ENCOUNTER — Encounter: Payer: Self-pay | Admitting: Obstetrics and Gynecology

## 2021-01-14 ENCOUNTER — Other Ambulatory Visit: Payer: Self-pay

## 2021-01-14 ENCOUNTER — Ambulatory Visit (INDEPENDENT_AMBULATORY_CARE_PROVIDER_SITE_OTHER): Payer: Medicaid Other | Admitting: Obstetrics and Gynecology

## 2021-01-14 VITALS — BP 114/73 | HR 92 | Wt 215.0 lb

## 2021-01-14 DIAGNOSIS — O24419 Gestational diabetes mellitus in pregnancy, unspecified control: Secondary | ICD-10-CM

## 2021-01-14 DIAGNOSIS — Z98891 History of uterine scar from previous surgery: Secondary | ICD-10-CM

## 2021-01-14 DIAGNOSIS — Z3480 Encounter for supervision of other normal pregnancy, unspecified trimester: Secondary | ICD-10-CM

## 2021-01-14 DIAGNOSIS — Z8759 Personal history of other complications of pregnancy, childbirth and the puerperium: Secondary | ICD-10-CM

## 2021-01-14 NOTE — Progress Notes (Signed)
   PRENATAL VISIT NOTE  Subjective:  Marcia Bryant is a 35 y.o. [phone] at [redacted]w[redacted]d being seen today for ongoing prenatal care.  She is currently monitored for the following issues for this high-risk pregnancy and has BMI 33.0-33.9,adult; Obesity in pregnancy; History of pre-eclampsia; History of shoulder dystocia in prior pregnancy; Umbilical hernia; failed early 1h (154); Symptomatic mammary hypertrophy; Chronic bilateral thoracic back pain; Neck pain; Supervision of other normal pregnancy, antepartum; History of cesarean delivery; Back pain affecting pregnancy in first trimester; Placenta previa antepartum in second trimester; and Gestational diabetes mellitus (GDM) affecting pregnancy, antepartum on their problem list.  Patient reports no complaints.  Contractions: Not present. Vag. Bleeding: None.  Movement: Present. Denies leaking of fluid.   The following portions of the patient's history were reviewed and updated as appropriate: allergies, current medications, past family history, past medical history, past social history, past surgical history and problem list.   Objective:   Vitals:   01/14/21 1501  BP: 114/73  Pulse: 92  Weight: 215 lb (97.5 kg)    Fetal Status: Fetal Heart Rate (bpm): 155 Fundal Height: 35 cm Movement: Present     General:  Alert, oriented and cooperative. Patient is in no acute distress.  Skin: Skin is warm and dry. No rash noted.   Cardiovascular: Normal heart rate noted  Respiratory: Normal respiratory effort, no problems with respiration noted  Abdomen: Soft, gravid, appropriate for gestational age.  Pain/Pressure: Present     Pelvic: Cervical exam deferred        Extremities: Normal range of motion.     Mental Status: Normal mood and affect. Normal behavior. Normal judgment and thought content.   Assessment and Plan:  Pregnancy: [id number] at [redacted]w[redacted]d 1. Supervision of other normal pregnancy, antepartum Patient is doing well without  complaints Cultures next visit  2. Gestational diabetes mellitus (GDM) affecting pregnancy, antepartum Patient did not bring CBGs and has increased metformin to 1000 BID Patient reports fasting values no higher than 90 and pp no higher than 100 Continue antenatal testing  3. History of pre-eclampsia Normotensive Reviewed si/sx of preeclampsia  4. History of cesarean delivery Scheduled for repeat with BTL  Preterm labor symptoms and general obstetric precautions including but not limited to vaginal bleeding, contractions, leaking of fluid and fetal movement were reviewed in detail with the patient. Please refer to After Visit Summary for other counseling recommendations.   Return in about 2 weeks (around 01/28/2021) for in person, ROB, High risk.  Future Appointments  Date Time Provider Department Center  01/17/2021  8:30 AM [facility] NURSE [facility]  01/17/2021  8:45 AM [facility] US5 [facility]  01/24/2021  8:30 AM [facility] NURSE [facility]  01/24/2021  8:45 AM [facility] US5 [facility]  01/28/2021  8:30 AM Adam Phenix, MD [facility] None  01/31/2021  8:30 AM [facility] NURSE [facility]  01/31/2021  8:45 AM [facility] US5 [facility]    Catalina Antigua, MD

## 2021-01-17 ENCOUNTER — Ambulatory Visit: Payer: Medicaid Other | Admitting: *Deleted

## 2021-01-17 ENCOUNTER — Encounter: Payer: Medicaid Other | Admitting: Obstetrics & Gynecology

## 2021-01-17 ENCOUNTER — Ambulatory Visit: Payer: Medicaid Other | Attending: Obstetrics and Gynecology

## 2021-01-17 ENCOUNTER — Other Ambulatory Visit: Payer: Self-pay | Admitting: *Deleted

## 2021-01-17 ENCOUNTER — Encounter: Payer: Self-pay | Admitting: *Deleted

## 2021-01-17 ENCOUNTER — Other Ambulatory Visit: Payer: Self-pay

## 2021-01-17 DIAGNOSIS — O24415 Gestational diabetes mellitus in pregnancy, controlled by oral hypoglycemic drugs: Secondary | ICD-10-CM

## 2021-01-17 DIAGNOSIS — Z3480 Encounter for supervision of other normal pregnancy, unspecified trimester: Secondary | ICD-10-CM | POA: Insufficient documentation

## 2021-01-17 DIAGNOSIS — O24419 Gestational diabetes mellitus in pregnancy, unspecified control: Secondary | ICD-10-CM

## 2021-01-20 ENCOUNTER — Encounter: Payer: Medicaid Other | Admitting: Obstetrics and Gynecology

## 2021-01-24 ENCOUNTER — Other Ambulatory Visit: Payer: Self-pay

## 2021-01-24 ENCOUNTER — Ambulatory Visit: Payer: Medicaid Other | Attending: Obstetrics and Gynecology

## 2021-01-24 ENCOUNTER — Encounter: Payer: Self-pay | Admitting: *Deleted

## 2021-01-24 ENCOUNTER — Ambulatory Visit: Payer: Medicaid Other | Admitting: *Deleted

## 2021-01-24 DIAGNOSIS — O2693 Pregnancy related conditions, unspecified, third trimester: Secondary | ICD-10-CM

## 2021-01-24 DIAGNOSIS — O09293 Supervision of pregnancy with other poor reproductive or obstetric history, third trimester: Secondary | ICD-10-CM

## 2021-01-24 DIAGNOSIS — Z3480 Encounter for supervision of other normal pregnancy, unspecified trimester: Secondary | ICD-10-CM

## 2021-01-24 DIAGNOSIS — Z3A35 35 weeks gestation of pregnancy: Secondary | ICD-10-CM | POA: Diagnosis not present

## 2021-01-24 DIAGNOSIS — Z148 Genetic carrier of other disease: Secondary | ICD-10-CM | POA: Diagnosis not present

## 2021-01-24 DIAGNOSIS — K429 Umbilical hernia without obstruction or gangrene: Secondary | ICD-10-CM | POA: Diagnosis not present

## 2021-01-24 DIAGNOSIS — O24419 Gestational diabetes mellitus in pregnancy, unspecified control: Secondary | ICD-10-CM | POA: Diagnosis present

## 2021-01-24 DIAGNOSIS — O34219 Maternal care for unspecified type scar from previous cesarean delivery: Secondary | ICD-10-CM

## 2021-01-24 DIAGNOSIS — E669 Obesity, unspecified: Secondary | ICD-10-CM | POA: Diagnosis not present

## 2021-01-24 DIAGNOSIS — O24415 Gestational diabetes mellitus in pregnancy, controlled by oral hypoglycemic drugs: Secondary | ICD-10-CM

## 2021-01-24 DIAGNOSIS — O99213 Obesity complicating pregnancy, third trimester: Secondary | ICD-10-CM | POA: Diagnosis not present

## 2021-01-28 ENCOUNTER — Encounter: Payer: Medicaid Other | Admitting: Obstetrics & Gynecology

## 2021-01-30 ENCOUNTER — Other Ambulatory Visit: Payer: Self-pay

## 2021-01-30 ENCOUNTER — Ambulatory Visit (INDEPENDENT_AMBULATORY_CARE_PROVIDER_SITE_OTHER): Payer: Medicaid Other | Admitting: Obstetrics and Gynecology

## 2021-01-30 ENCOUNTER — Other Ambulatory Visit (HOSPITAL_COMMUNITY)
Admission: RE | Admit: 2021-01-30 | Discharge: 2021-01-30 | Disposition: A | Discharge status: Home / Self Care | Payer: Medicaid Other | Source: Ambulatory Visit | Attending: Obstetrics and Gynecology | Admitting: Obstetrics and Gynecology

## 2021-01-30 VITALS — BP 116/75 | HR 91 | Wt 213.2 lb

## 2021-01-30 DIAGNOSIS — O24419 Gestational diabetes mellitus in pregnancy, unspecified control: Secondary | ICD-10-CM

## 2021-01-30 DIAGNOSIS — Z3480 Encounter for supervision of other normal pregnancy, unspecified trimester: Secondary | ICD-10-CM

## 2021-01-30 DIAGNOSIS — K429 Umbilical hernia without obstruction or gangrene: Secondary | ICD-10-CM

## 2021-01-30 DIAGNOSIS — Z3A36 36 weeks gestation of pregnancy: Secondary | ICD-10-CM

## 2021-01-30 DIAGNOSIS — Z8759 Personal history of other complications of pregnancy, childbirth and the puerperium: Secondary | ICD-10-CM

## 2021-01-30 DIAGNOSIS — Z98891 History of uterine scar from previous surgery: Secondary | ICD-10-CM

## 2021-01-30 NOTE — Progress Notes (Signed)
   PRENATAL VISIT NOTE  Subjective:  Marcia Bryant is a 35 y.o. [phone] at [redacted]w[redacted]d being seen today for ongoing prenatal care.  She is currently monitored for the following issues for this high-risk pregnancy and has BMI 33.0-33.9,adult; Obesity in pregnancy; History of pre-eclampsia; History of shoulder dystocia in prior pregnancy; Umbilical hernia; failed early 1h (154); Symptomatic mammary hypertrophy; Chronic bilateral thoracic back pain; Neck pain; Supervision of other normal pregnancy, antepartum; History of cesarean delivery; Back pain affecting pregnancy in first trimester; Placenta previa antepartum in second trimester; Gestational diabetes mellitus (GDM) affecting pregnancy, antepartum; and [redacted] weeks gestation of pregnancy on their problem list.  Patient doing well with no acute concerns today. She reports general fatigue and discomfort.  Contractions: Not present. Vag. Bleeding: None.  Movement: Present. Denies leaking of fluid.   The following portions of the patient's history were reviewed and updated as appropriate: allergies, current medications, past family history, past medical history, past social history, past surgical history and problem list. Problem list updated.  Objective:   Vitals:   01/30/21 0935  BP: 116/75  Pulse: 91  Weight: 213 lb 3.2 oz (96.7 kg)    Fetal Status: Fetal Heart Rate (bpm): 157 Fundal Height: 36 cm Movement: Present     General:  Alert, oriented and cooperative. Patient is in no acute distress.  Skin: Skin is warm and dry. No rash noted.   Cardiovascular: Normal heart rate noted  Respiratory: Normal respiratory effort, no problems with respiration noted  Abdomen: Soft, gravid, appropriate for gestational age.  Pain/Pressure: Present     Pelvic: Cervical exam deferred Dilation: Closed Effacement (%): Thick Station: Ballotable  Extremities: Normal range of motion.     Mental Status:  Normal mood and affect. Normal behavior. Normal judgment and  thought content.   Assessment and Plan:  Pregnancy: [id number] at [redacted]w[redacted]d  1. Supervision of other normal pregnancy, antepartum  - Strep Gp B NAA - Cervicovaginal ancillary only( [place])  2. Gestational diabetes mellitus (GDM) affecting pregnancy, antepartum Pt did not bring blood sugars, but from recollection her FBS was 72 and usually runs from 80s-98. Postprandial blood sugars generally are less than 120 other than dinner values which can be sometime slightly over 120  3. History of cesarean delivery Repeat c/s scheduled with BTL 4/12  4. History of pre-eclampsia No s/sx of preeclmapsia  5. History of shoulder dystocia in prior pregnancy   6. [redacted] weeks gestation of pregnancy   7. Umbilical hernia without obstruction and without gangrene Consider surgery referral after postpartum period  Preterm labor symptoms and general obstetric precautions including but not limited to vaginal bleeding, contractions, leaking of fluid and fetal movement were reviewed in detail with the patient.  Please refer to After Visit Summary for other counseling recommendations.   Return in about 1 week (around 02/06/2021) for [facility], in person.   Mariel Aloe, MD Faculty Attending Center for [facility]

## 2021-01-30 NOTE — Progress Notes (Signed)
Pt reports fetal movement with occasional pressure. Pt reports fasting BG today was 72

## 2021-01-31 ENCOUNTER — Ambulatory Visit: Payer: Medicaid Other | Admitting: *Deleted

## 2021-01-31 ENCOUNTER — Ambulatory Visit: Payer: Medicaid Other | Attending: Obstetrics

## 2021-01-31 ENCOUNTER — Encounter: Payer: Self-pay | Admitting: *Deleted

## 2021-01-31 DIAGNOSIS — O24415 Gestational diabetes mellitus in pregnancy, controlled by oral hypoglycemic drugs: Secondary | ICD-10-CM

## 2021-01-31 DIAGNOSIS — O24419 Gestational diabetes mellitus in pregnancy, unspecified control: Secondary | ICD-10-CM

## 2021-01-31 DIAGNOSIS — Z3480 Encounter for supervision of other normal pregnancy, unspecified trimester: Secondary | ICD-10-CM

## 2021-01-31 LAB — CERVICOVAGINAL ANCILLARY ONLY
Chlamydia: NEGATIVE
Comment: NEGATIVE
Comment: NORMAL
Neisseria Gonorrhea: NEGATIVE

## 2021-02-01 LAB — STREP GP B NAA: Strep Gp B NAA: NEGATIVE

## 2021-02-04 NOTE — Patient Instructions (Signed)
Marcia Bryant  02/04/2021   Your procedure is scheduled on:  02/18/2021  Arrive at 0730 at [employer] [facility] on [address] at [facility]  and [employer]. You are invited to use the FREE valet parking or use the Visitor's parking deck.  Pick up the phone at the desk and dial [phone].  Call this number if you have problems the morning of surgery: [phone]  Remember:   Do not eat food:(After Midnight) Desps de medianoche.  Do not drink clear liquids: (After Midnight) Desps de medianoche.  Take these medicines the morning of surgery with A SIP OF WATER:  none   Do not wear jewelry, make-up or nail polish.  Do not wear lotions, powders, or perfumes. Do not wear deodorant.  Do not shave 48 hours prior to surgery.  Do not bring valuables to the hospital.  [facility] is not   responsible for any belongings or valuables brought to the hospital.  Contacts, dentures or bridgework may not be worn into surgery.  Leave suitcase in the car. After surgery it may be brought to your room.  For patients admitted to the hospital, checkout time is 11:00 AM the day of              discharge.      Please read over the following fact sheets that you were given:     Preparing for Surgery

## 2021-02-05 ENCOUNTER — Telehealth (HOSPITAL_COMMUNITY): Payer: Self-pay | Admitting: *Deleted

## 2021-02-05 NOTE — Telephone Encounter (Signed)
Preadmission screen  

## 2021-02-06 ENCOUNTER — Telehealth (HOSPITAL_COMMUNITY): Payer: Self-pay | Admitting: *Deleted

## 2021-02-06 NOTE — Telephone Encounter (Signed)
Preadmission screen  

## 2021-02-07 ENCOUNTER — Ambulatory Visit: Payer: Medicaid Other | Attending: Obstetrics and Gynecology

## 2021-02-07 ENCOUNTER — Encounter (HOSPITAL_COMMUNITY): Payer: Self-pay

## 2021-02-07 ENCOUNTER — Ambulatory Visit: Payer: Medicaid Other | Admitting: *Deleted

## 2021-02-07 ENCOUNTER — Encounter: Payer: Self-pay | Admitting: *Deleted

## 2021-02-07 ENCOUNTER — Other Ambulatory Visit: Payer: Self-pay

## 2021-02-07 ENCOUNTER — Encounter: Payer: Medicaid Other | Admitting: Obstetrics & Gynecology

## 2021-02-07 ENCOUNTER — Other Ambulatory Visit: Payer: Self-pay | Admitting: Family Medicine

## 2021-02-07 DIAGNOSIS — Z3480 Encounter for supervision of other normal pregnancy, unspecified trimester: Secondary | ICD-10-CM | POA: Diagnosis present

## 2021-02-07 DIAGNOSIS — O24419 Gestational diabetes mellitus in pregnancy, unspecified control: Secondary | ICD-10-CM

## 2021-02-10 ENCOUNTER — Other Ambulatory Visit: Payer: Self-pay

## 2021-02-10 ENCOUNTER — Encounter: Payer: Self-pay | Admitting: Obstetrics and Gynecology

## 2021-02-10 ENCOUNTER — Ambulatory Visit (INDEPENDENT_AMBULATORY_CARE_PROVIDER_SITE_OTHER): Payer: Medicaid Other | Admitting: Obstetrics and Gynecology

## 2021-02-10 VITALS — BP 130/82 | HR 86 | Wt 213.0 lb

## 2021-02-10 DIAGNOSIS — Z3480 Encounter for supervision of other normal pregnancy, unspecified trimester: Secondary | ICD-10-CM

## 2021-02-10 DIAGNOSIS — Z98891 History of uterine scar from previous surgery: Secondary | ICD-10-CM

## 2021-02-10 DIAGNOSIS — O24419 Gestational diabetes mellitus in pregnancy, unspecified control: Secondary | ICD-10-CM

## 2021-02-10 DIAGNOSIS — Z8759 Personal history of other complications of pregnancy, childbirth and the puerperium: Secondary | ICD-10-CM

## 2021-02-10 DIAGNOSIS — O99210 Obesity complicating pregnancy, unspecified trimester: Secondary | ICD-10-CM

## 2021-02-10 NOTE — Progress Notes (Signed)
   PRENATAL VISIT NOTE  Subjective:  Marcia Bryant is a 35 y.o. [phone] at [redacted]w[redacted]d being seen today for ongoing prenatal care.  She is currently monitored for the following issues for this high-risk pregnancy and has BMI 33.0-33.9,adult; Obesity in pregnancy; History of pre-eclampsia; History of shoulder dystocia in prior pregnancy; Umbilical hernia; failed early 1h (154); Symptomatic mammary hypertrophy; Chronic bilateral thoracic back pain; Neck pain; Supervision of other normal pregnancy, antepartum; History of cesarean delivery; Back pain affecting pregnancy in first trimester; Placenta previa antepartum in second trimester; and Gestational diabetes mellitus (GDM) affecting pregnancy, antepartum on their problem list.  Patient reports no complaints.  Contractions: Not present. Vag. Bleeding: None.  Movement: Present. Denies leaking of fluid.   The following portions of the patient's history were reviewed and updated as appropriate: allergies, current medications, past family history, past medical history, past social history, past surgical history and problem list.   Objective:   Vitals:   02/10/21 1417  BP: 130/82  Pulse: 86  Weight: 213 lb (96.6 kg)    Fetal Status: Fetal Heart Rate (bpm): 148 Fundal Height: 39 cm Movement: Present     General:  Alert, oriented and cooperative. Patient is in no acute distress.  Skin: Skin is warm and dry. No rash noted.   Cardiovascular: Normal heart rate noted  Respiratory: Normal respiratory effort, no problems with respiration noted  Abdomen: Soft, gravid, appropriate for gestational age.  Pain/Pressure: Present     Pelvic: Cervical exam deferred        Extremities: Normal range of motion.  Edema: Trace  Mental Status: Normal mood and affect. Normal behavior. Normal judgment and thought content.   Assessment and Plan:  Pregnancy: G5P3013 at [redacted]w[redacted]d 1. Supervision of other normal pregnancy, antepartum Patient is doing well without  complaints Eagerly awaiting delivery date on 4/12  2. Gestational diabetes mellitus (GDM) affecting pregnancy, antepartum CBGs reviewed and fasting as high as 82 and majority of pp within range with a few values as high as 133 Follow up BPP on 4/8  3. History of pre-eclampsia No si/sx of preeclampsia   4. History of cesarean delivery Scheduled for repeat c-section on 4/12  5. Obesity in pregnancy   Term labor symptoms and general obstetric precautions including but not limited to vaginal bleeding, contractions, leaking of fluid and fetal movement were reviewed in detail with the patient. Please refer to After Visit Summary for other counseling recommendations.   Return in about 5 weeks (around 03/17/2021) for postpartum visit.  Future Appointments  Date Time Provider Department Center  02/10/2021  2:45 PM Westin Knotts, MD [facility] None  02/14/2021  8:30 AM [facility] NURSE [facility]  02/14/2021  8:45 AM [facility] US4 [facility]  02/17/2021  9:30 AM [facility] PAT 1 [facility] None  02/17/2021 10:25 AM [facility]-SCREENING [facility] None    Catalina Antigua, MD

## 2021-02-10 NOTE — Progress Notes (Signed)
HOB has CS scheduled 02/18/21.  Reports no problems today.

## 2021-02-12 ENCOUNTER — Other Ambulatory Visit: Payer: Self-pay

## 2021-02-12 MED ORDER — BREAST PUMP MISC: 0 refills | Status: DC

## 2021-02-12 NOTE — Progress Notes (Signed)
Received mychart message pt requesting breast pump Order faxed to [employer] [phone]

## 2021-02-14 ENCOUNTER — Other Ambulatory Visit: Payer: Self-pay

## 2021-02-14 ENCOUNTER — Encounter: Payer: Self-pay | Admitting: *Deleted

## 2021-02-14 ENCOUNTER — Ambulatory Visit: Payer: Medicaid Other | Admitting: *Deleted

## 2021-02-14 ENCOUNTER — Ambulatory Visit: Payer: Medicaid Other | Attending: Obstetrics and Gynecology

## 2021-02-14 DIAGNOSIS — Z3480 Encounter for supervision of other normal pregnancy, unspecified trimester: Secondary | ICD-10-CM

## 2021-02-14 DIAGNOSIS — O24419 Gestational diabetes mellitus in pregnancy, unspecified control: Secondary | ICD-10-CM | POA: Insufficient documentation

## 2021-02-14 DIAGNOSIS — O24415 Gestational diabetes mellitus in pregnancy, controlled by oral hypoglycemic drugs: Secondary | ICD-10-CM | POA: Diagnosis not present

## 2021-02-17 ENCOUNTER — Other Ambulatory Visit: Payer: Self-pay

## 2021-02-17 ENCOUNTER — Other Ambulatory Visit (HOSPITAL_COMMUNITY)
Admission: RE | Admit: 2021-02-17 | Discharge: 2021-02-17 | Disposition: A | Discharge status: Home / Self Care | Payer: Medicaid Other | Source: Ambulatory Visit | Attending: Family Medicine | Admitting: Family Medicine

## 2021-02-17 ENCOUNTER — Encounter (HOSPITAL_COMMUNITY): Payer: Self-pay

## 2021-02-17 ENCOUNTER — Encounter (HOSPITAL_COMMUNITY)
Admission: RE | Admit: 2021-02-17 | Discharge: 2021-02-17 | Disposition: A | Discharge status: Home / Self Care | Payer: Medicaid Other | Source: Ambulatory Visit | Attending: Family Medicine | Admitting: Family Medicine

## 2021-02-17 DIAGNOSIS — Z01812 Encounter for preprocedural laboratory examination: Secondary | ICD-10-CM | POA: Insufficient documentation

## 2021-02-17 DIAGNOSIS — Z20822 Contact with and (suspected) exposure to covid-19: Secondary | ICD-10-CM | POA: Insufficient documentation

## 2021-02-17 LAB — SARS CORONAVIRUS 2 (TAT 6-24 HRS): SARS Coronavirus 2: NEGATIVE

## 2021-02-17 LAB — CBC
HCT: 37.7 % (ref 36.0–46.0)
Hemoglobin: 12.9 g/dL (ref 12.0–15.0)
MCH: 28 pg (ref 26.0–34.0)
MCHC: 34.2 g/dL (ref 30.0–36.0)
MCV: 81.8 fL (ref 80.0–100.0)
Platelets: 276 10*3/uL (ref 150–400)
RBC: 4.61 MIL/uL (ref 3.87–5.11)
RDW: 13.7 % (ref 11.5–15.5)
WBC: 10.6 10*3/uL — ABNORMAL HIGH (ref 4.0–10.5)
nRBC: 0 % (ref 0.0–0.2)

## 2021-02-17 LAB — RAPID HIV SCREEN (HIV 1/2 AB+AG)
HIV 1/2 Antibodies: NONREACTIVE
HIV-1 P24 Antigen - HIV24: NONREACTIVE

## 2021-02-17 LAB — TYPE AND SCREEN
ABO/RH(D): O POS
Antibody Screen: NEGATIVE

## 2021-02-18 ENCOUNTER — Inpatient Hospital Stay (HOSPITAL_COMMUNITY)
Admission: RE | Admit: 2021-02-18 | Discharge: 2021-02-20 | DRG: 785 | Disposition: A | Discharge status: Home / Self Care | Payer: Medicaid Other | Attending: Family Medicine | Admitting: Family Medicine

## 2021-02-18 ENCOUNTER — Inpatient Hospital Stay (HOSPITAL_COMMUNITY): Payer: Medicaid Other | Admitting: Anesthesiology

## 2021-02-18 ENCOUNTER — Other Ambulatory Visit: Payer: Self-pay

## 2021-02-18 ENCOUNTER — Encounter (HOSPITAL_COMMUNITY): Payer: Self-pay | Admitting: Family Medicine

## 2021-02-18 ENCOUNTER — Encounter (HOSPITAL_COMMUNITY)
Admission: RE | Disposition: A | Discharge status: Home / Self Care | Payer: Self-pay | Source: Home / Self Care | Attending: Family Medicine

## 2021-02-18 DIAGNOSIS — O99214 Obesity complicating childbirth: Secondary | ICD-10-CM | POA: Diagnosis not present

## 2021-02-18 DIAGNOSIS — Z302 Encounter for sterilization: Secondary | ICD-10-CM

## 2021-02-18 DIAGNOSIS — O9902 Anemia complicating childbirth: Secondary | ICD-10-CM | POA: Diagnosis not present

## 2021-02-18 DIAGNOSIS — O34211 Maternal care for low transverse scar from previous cesarean delivery: Principal | ICD-10-CM | POA: Diagnosis present

## 2021-02-18 DIAGNOSIS — Z8759 Personal history of other complications of pregnancy, childbirth and the puerperium: Secondary | ICD-10-CM

## 2021-02-18 DIAGNOSIS — Z3A39 39 weeks gestation of pregnancy: Secondary | ICD-10-CM

## 2021-02-18 DIAGNOSIS — Z98891 History of uterine scar from previous surgery: Secondary | ICD-10-CM

## 2021-02-18 DIAGNOSIS — Z20822 Contact with and (suspected) exposure to covid-19: Secondary | ICD-10-CM | POA: Diagnosis present

## 2021-02-18 DIAGNOSIS — O99210 Obesity complicating pregnancy, unspecified trimester: Secondary | ICD-10-CM | POA: Diagnosis present

## 2021-02-18 DIAGNOSIS — Z3480 Encounter for supervision of other normal pregnancy, unspecified trimester: Secondary | ICD-10-CM

## 2021-02-18 DIAGNOSIS — O24425 Gestational diabetes mellitus in childbirth, controlled by oral hypoglycemic drugs: Secondary | ICD-10-CM | POA: Diagnosis present

## 2021-02-18 DIAGNOSIS — O24424 Gestational diabetes mellitus in childbirth, insulin controlled: Secondary | ICD-10-CM | POA: Diagnosis not present

## 2021-02-18 DIAGNOSIS — O4402 Placenta previa specified as without hemorrhage, second trimester: Secondary | ICD-10-CM | POA: Diagnosis present

## 2021-02-18 DIAGNOSIS — O24419 Gestational diabetes mellitus in pregnancy, unspecified control: Secondary | ICD-10-CM | POA: Diagnosis present

## 2021-02-18 LAB — GLUCOSE, CAPILLARY
Glucose-Capillary: 72 mg/dL (ref 70–99)
Glucose-Capillary: 64 mg/dL — ABNORMAL LOW (ref 70–99)
Glucose-Capillary: 83 mg/dL (ref 70–99)

## 2021-02-18 LAB — RPR: RPR Ser Ql: NONREACTIVE

## 2021-02-18 LAB — CREATININE, SERUM
Creatinine, Ser: 0.61 mg/dL (ref 0.44–1.00)
GFR, Estimated: >60 mL/min (ref >60)

## 2021-02-18 SURGERY — Surgical Case
Anesthesia: Spinal

## 2021-02-18 MED ORDER — MORPHINE SULFATE (PF) 0.5 MG/ML IJ SOLN
INTRAMUSCULAR | Status: AC
  Filled 2021-02-18: qty 10

## 2021-02-18 MED ORDER — MEASLES, MUMPS & RUBELLA VAC IJ SOLR: 0.5000 mL | Freq: Once | INTRAMUSCULAR | Status: DC

## 2021-02-18 MED ORDER — FENTANYL CITRATE (PF) 100 MCG/2ML IJ SOLN
INTRAMUSCULAR | Status: DC | PRN
  Administered 2021-02-18: 15 ug via INTRATHECAL

## 2021-02-18 MED ORDER — PHENYLEPHRINE HCL-NACL 20-0.9 MG/250ML-% IV SOLN
INTRAVENOUS | Status: DC | PRN
  Administered 2021-02-18: 80 ug/min via INTRAVENOUS

## 2021-02-18 MED ORDER — LACTATED RINGERS IV SOLN
125.0000 mL/h | INTRAVENOUS | Status: DC
  Administered 2021-02-18 (×2): 125 mL/h via INTRAVENOUS

## 2021-02-18 MED ORDER — SENNOSIDES-DOCUSATE SODIUM 8.6-50 MG PO TABS
2.0000 | ORAL_TABLET | Freq: Every day | ORAL | Status: DC
  Administered 2021-02-19 – 2021-02-20 (×2): 2 via ORAL
  Filled 2021-02-18 (×2): qty 2

## 2021-02-18 MED ORDER — STERILE WATER FOR IRRIGATION IR SOLN
Status: DC | PRN
  Administered 2021-02-18: 1000 mL

## 2021-02-18 MED ORDER — SIMETHICONE 80 MG PO CHEW: 80.0000 mg | CHEWABLE_TABLET | ORAL | Status: DC | PRN

## 2021-02-18 MED ORDER — SCOPOLAMINE 1 MG/3DAYS TD PT72
1.0000 | MEDICATED_PATCH | TRANSDERMAL | Status: DC
  Administered 2021-02-18: 1.5 mg via TRANSDERMAL

## 2021-02-18 MED ORDER — NALOXONE HCL 4 MG/10ML IJ SOLN
1.0000 ug/kg/h | INTRAVENOUS | Status: DC | PRN
  Filled 2021-02-18: qty 5

## 2021-02-18 MED ORDER — MENTHOL 3 MG MT LOZG: 1.0000 | LOZENGE | OROMUCOSAL | Status: DC | PRN

## 2021-02-18 MED ORDER — SIMETHICONE 80 MG PO CHEW
80.0000 mg | CHEWABLE_TABLET | Freq: Three times a day (TID) | ORAL | Status: DC
  Administered 2021-02-19 – 2021-02-20 (×4): 80 mg via ORAL
  Filled 2021-02-18 (×5): qty 1

## 2021-02-18 MED ORDER — PRENATAL MULTIVITAMIN CH
1.0000 | ORAL_TABLET | Freq: Every day | ORAL | Status: DC
  Administered 2021-02-19 – 2021-02-20 (×2): 1 via ORAL
  Filled 2021-02-18 (×2): qty 1

## 2021-02-18 MED ORDER — SCOPOLAMINE 1 MG/3DAYS TD PT72: 1.0000 | MEDICATED_PATCH | Freq: Once | TRANSDERMAL | Status: DC

## 2021-02-18 MED ORDER — BUPIVACAINE IN DEXTROSE 0.75-8.25 % IT SOLN
INTRATHECAL | Status: DC | PRN
  Administered 2021-02-18: 1.6 mL via INTRATHECAL

## 2021-02-18 MED ORDER — OXYTOCIN-SODIUM CHLORIDE 30-0.9 UT/500ML-% IV SOLN
2.5000 [IU]/h | INTRAVENOUS | Status: AC
  Administered 2021-02-18: 2.5 [IU]/h via INTRAVENOUS
  Filled 2021-02-18: qty 500

## 2021-02-18 MED ORDER — SODIUM CHLORIDE 0.9% FLUSH: 3.0000 mL | INTRAVENOUS | Status: DC | PRN

## 2021-02-18 MED ORDER — CEFAZOLIN SODIUM-DEXTROSE 2-4 GM/100ML-% IV SOLN
2.0000 g | INTRAVENOUS | Status: AC
  Administered 2021-02-18: 2 g via INTRAVENOUS

## 2021-02-18 MED ORDER — FENTANYL CITRATE (PF) 100 MCG/2ML IJ SOLN
INTRAMUSCULAR | Status: AC
  Filled 2021-02-18: qty 2

## 2021-02-18 MED ORDER — PHENYLEPHRINE HCL-NACL 20-0.9 MG/250ML-% IV SOLN
INTRAVENOUS | Status: AC
  Filled 2021-02-18: qty 250

## 2021-02-18 MED ORDER — COCONUT OIL OIL: 1.0000 "application " | TOPICAL_OIL | Status: DC | PRN

## 2021-02-18 MED ORDER — KETOROLAC TROMETHAMINE 30 MG/ML IJ SOLN
INTRAMUSCULAR | Status: AC
  Filled 2021-02-18: qty 1

## 2021-02-18 MED ORDER — DEXTROSE 50 % IV SOLN
12.5000 g | INTRAVENOUS | Status: AC
  Administered 2021-02-18: 12.5 g via INTRAVENOUS

## 2021-02-18 MED ORDER — CEFAZOLIN SODIUM-DEXTROSE 2-4 GM/100ML-% IV SOLN
INTRAVENOUS | Status: AC
  Filled 2021-02-18: qty 100

## 2021-02-18 MED ORDER — TETANUS-DIPHTH-ACELL PERTUSSIS 5-2.5-18.5 LF-MCG/0.5 IM SUSY: 0.5000 mL | PREFILLED_SYRINGE | Freq: Once | INTRAMUSCULAR | Status: DC

## 2021-02-18 MED ORDER — OXYCODONE HCL 5 MG PO TABS: 5.0000 mg | ORAL_TABLET | ORAL | Status: DC | PRN

## 2021-02-18 MED ORDER — DIBUCAINE (PERIANAL) 1 % EX OINT: 1.0000 "application " | TOPICAL_OINTMENT | CUTANEOUS | Status: DC | PRN

## 2021-02-18 MED ORDER — DIPHENHYDRAMINE HCL 50 MG/ML IJ SOLN: 12.5000 mg | INTRAMUSCULAR | Status: DC | PRN

## 2021-02-18 MED ORDER — NALBUPHINE HCL 10 MG/ML IJ SOLN: 5.0000 mg | Freq: Once | INTRAMUSCULAR | Status: DC | PRN

## 2021-02-18 MED ORDER — OXYTOCIN-SODIUM CHLORIDE 30-0.9 UT/500ML-% IV SOLN
INTRAVENOUS | Status: DC | PRN
  Administered 2021-02-18: 30 [IU] via INTRAVENOUS

## 2021-02-18 MED ORDER — NALBUPHINE HCL 10 MG/ML IJ SOLN
5.0000 mg | INTRAMUSCULAR | Status: DC | PRN
Start: 2021-02-18 — End: 2021-02-20

## 2021-02-18 MED ORDER — IBUPROFEN 800 MG PO TABS
800.0000 mg | ORAL_TABLET | Freq: Three times a day (TID) | ORAL | Status: DC
  Administered 2021-02-18 – 2021-02-20 (×6): 800 mg via ORAL
  Filled 2021-02-18 (×7): qty 1

## 2021-02-18 MED ORDER — OXYCODONE HCL 5 MG/5ML PO SOLN
5.0000 mg | Freq: Once | ORAL | Status: DC | PRN
Start: 2021-02-18 — End: 2021-02-18

## 2021-02-18 MED ORDER — HYDROMORPHONE HCL 1 MG/ML IJ SOLN: 0.2500 mg | INTRAMUSCULAR | Status: DC | PRN

## 2021-02-18 MED ORDER — MEPERIDINE HCL 25 MG/ML IJ SOLN: 6.2500 mg | INTRAMUSCULAR | Status: DC | PRN

## 2021-02-18 MED ORDER — MORPHINE SULFATE (PF) 0.5 MG/ML IJ SOLN
INTRAMUSCULAR | Status: DC | PRN
  Administered 2021-02-18: 150 ug via INTRATHECAL

## 2021-02-18 MED ORDER — DEXTROSE 50 % IV SOLN
INTRAVENOUS | Status: AC
  Filled 2021-02-18: qty 50

## 2021-02-18 MED ORDER — KETOROLAC TROMETHAMINE 30 MG/ML IJ SOLN
30.0000 mg | Freq: Once | INTRAMUSCULAR | Status: AC | PRN
  Administered 2021-02-18: 30 mg via INTRAVENOUS

## 2021-02-18 MED ORDER — PROMETHAZINE HCL 25 MG/ML IJ SOLN: 6.2500 mg | INTRAMUSCULAR | Status: DC | PRN

## 2021-02-18 MED ORDER — LACTATED RINGERS IV SOLN: INTRAVENOUS | Status: DC | PRN

## 2021-02-18 MED ORDER — ZOLPIDEM TARTRATE 5 MG PO TABS: 5.0000 mg | ORAL_TABLET | Freq: Every evening | ORAL | Status: DC | PRN

## 2021-02-18 MED ORDER — OXYCODONE HCL 5 MG PO TABS: 5.0000 mg | ORAL_TABLET | Freq: Once | ORAL | Status: DC | PRN

## 2021-02-18 MED ORDER — NALOXONE HCL 0.4 MG/ML IJ SOLN: 0.4000 mg | INTRAMUSCULAR | Status: DC | PRN

## 2021-02-18 MED ORDER — ENOXAPARIN SODIUM 40 MG/0.4ML ~~LOC~~ SOLN
40.0000 mg | SUBCUTANEOUS | Status: DC
  Administered 2021-02-19 – 2021-02-20 (×2): 40 mg via SUBCUTANEOUS
  Filled 2021-02-18 (×2): qty 0.4

## 2021-02-18 MED ORDER — SCOPOLAMINE 1 MG/3DAYS TD PT72
MEDICATED_PATCH | TRANSDERMAL | Status: AC
  Filled 2021-02-18: qty 1

## 2021-02-18 MED ORDER — ONDANSETRON HCL 4 MG/2ML IJ SOLN
4.0000 mg | Freq: Three times a day (TID) | INTRAMUSCULAR | Status: DC | PRN
  Administered 2021-02-18: 4 mg via INTRAVENOUS

## 2021-02-18 MED ORDER — WITCH HAZEL-GLYCERIN EX PADS
1.0000 | MEDICATED_PAD | CUTANEOUS | Status: DC | PRN
Start: 2021-02-18 — End: 2021-02-20

## 2021-02-18 MED ORDER — POVIDONE-IODINE 10 % EX SWAB
2.0000 "application " | Freq: Once | CUTANEOUS | Status: AC
  Administered 2021-02-18: 2 via TOPICAL

## 2021-02-18 MED ORDER — DIPHENHYDRAMINE HCL 25 MG PO CAPS: 25.0000 mg | ORAL_CAPSULE | ORAL | Status: DC | PRN

## 2021-02-18 MED ORDER — NALBUPHINE HCL 10 MG/ML IJ SOLN: 5.0000 mg | INTRAMUSCULAR | Status: DC | PRN

## 2021-02-18 MED ORDER — DIPHENHYDRAMINE HCL 25 MG PO CAPS: 25.0000 mg | ORAL_CAPSULE | Freq: Four times a day (QID) | ORAL | Status: DC | PRN

## 2021-02-18 MED ORDER — SODIUM CHLORIDE 0.9 % IR SOLN
Status: DC | PRN
  Administered 2021-02-18: 1

## 2021-02-18 MED ORDER — OXYTOCIN-SODIUM CHLORIDE 30-0.9 UT/500ML-% IV SOLN
INTRAVENOUS | Status: AC
  Filled 2021-02-18: qty 500

## 2021-02-18 MED ORDER — LACTATED RINGERS IV SOLN: INTRAVENOUS | Status: DC

## 2021-02-18 SURGICAL SUPPLY — 35 items
BENZOIN TINCTURE PRP APPL 2/3 (GAUZE/BANDAGES/DRESSINGS) ×2 IMPLANT
CATH ROBINSON RED A/P 16FR (CATHETERS) IMPLANT
CHLORAPREP W/TINT 26ML (MISCELLANEOUS) ×2 IMPLANT
CLAMP CORD UMBIL (MISCELLANEOUS) IMPLANT
CLOSURE STERI STRIP 1/2 X4 (GAUZE/BANDAGES/DRESSINGS) ×2 IMPLANT
CLOTH BEACON ORANGE TIMEOUT ST (SAFETY) ×2 IMPLANT
DRSG OPSITE POSTOP 4X10 (GAUZE/BANDAGES/DRESSINGS) ×2 IMPLANT
ELECT REM PT RETURN 9FT ADLT (ELECTROSURGICAL) ×2
ELECTRODE REM PT RTRN 9FT ADLT (ELECTROSURGICAL) ×1 IMPLANT
EXTRACTOR VACUUM M CUP 4 TUBE (SUCTIONS) IMPLANT
GLOVE BIOGEL PI IND STRL 7.0 (GLOVE) ×1 IMPLANT
GLOVE BIOGEL PI IND STRL 7.5 (GLOVE) ×1 IMPLANT
GLOVE BIOGEL PI INDICATOR 7.0 (GLOVE) ×1
GLOVE BIOGEL PI INDICATOR 7.5 (GLOVE) ×1
GLOVE ECLIPSE 7.5 STRL STRAW (GLOVE) ×2 IMPLANT
GOWN STRL REUS W/TWL LRG LVL3 (GOWN DISPOSABLE) ×6 IMPLANT
KIT ABG SYR 3ML LUER SLIP (SYRINGE) IMPLANT
LIGASURE IMPACT 36 18CM CVD LR (INSTRUMENTS) ×2 IMPLANT
NEEDLE HYPO 25X5/8 SAFETYGLIDE (NEEDLE) IMPLANT
NS IRRIG 1000ML POUR BTL (IV SOLUTION) ×2 IMPLANT
PACK C SECTION WH (CUSTOM PROCEDURE TRAY) ×2 IMPLANT
PAD OB MATERNITY 4.3X12.25 (PERSONAL CARE ITEMS) ×2 IMPLANT
PENCIL SMOKE EVAC W/HOLSTER (ELECTROSURGICAL) ×2 IMPLANT
RTRCTR C-SECT PINK 25CM LRG (MISCELLANEOUS) ×2 IMPLANT
STRIP CLOSURE SKIN 1/2X4 (GAUZE/BANDAGES/DRESSINGS) ×2 IMPLANT
SUT PLAIN 2 0 (SUTURE) ×1
SUT PLAIN ABS 2-0 54XMFL TIE (SUTURE) ×1 IMPLANT
SUT VIC AB 0 CTX 36 (SUTURE) ×3
SUT VIC AB 0 CTX36XBRD ANBCTRL (SUTURE) ×3 IMPLANT
SUT VIC AB 2-0 CT1 27 (SUTURE) ×1
SUT VIC AB 2-0 CT1 TAPERPNT 27 (SUTURE) ×1 IMPLANT
SUT VIC AB 4-0 KS 27 (SUTURE) ×2 IMPLANT
TOWEL OR 17X24 6PK STRL BLUE (TOWEL DISPOSABLE) ×2 IMPLANT
TRAY FOLEY W/BAG SLVR 14FR LF (SET/KITS/TRAYS/PACK) ×2 IMPLANT
WATER STERILE IRR 1000ML POUR (IV SOLUTION) ×2 IMPLANT

## 2021-02-18 NOTE — Anesthesia Procedure Notes (Signed)
Spinal  Patient location during procedure: OB Start time: 02/18/2021 9:22 AM End time: 02/18/2021 9:27 AM Reason for block: surgical anesthesia Staffing Performed: other anesthesia staff  Anesthesiologist: Lowella Curb, MD Preanesthetic Checklist Completed: patient identified, IV checked, risks and benefits discussed, surgical consent, monitors and equipment checked, pre-op evaluation and timeout performed Spinal Block Patient position: sitting Prep: DuraPrep and site prepped and draped Patient monitoring: heart rate, cardiac monitor, continuous pulse ox and blood pressure Approach: midline Location: L3-4 Injection technique: single-shot Needle Needle type: Pencan  Needle gauge: 24 G Needle length: 10 cm Assessment Sensory level: T4 Events: CSF return Additional Notes SAB placed by SRNA under direct supervision

## 2021-02-18 NOTE — Anesthesia Postprocedure Evaluation (Signed)
Anesthesia Post Note  Patient: Marcia Bryant  Procedure(s) Performed: CESAREAN SECTION WITH BILATERAL TUBAL LIGATION (N/A )     Patient location during evaluation: PACU Anesthesia Type: Spinal Level of consciousness: awake and alert Pain management: pain level controlled Vital Signs Assessment: post-procedure vital signs reviewed and stable Respiratory status: spontaneous breathing, nonlabored ventilation and respiratory function stable Cardiovascular status: blood pressure returned to baseline and stable Postop Assessment: no apparent nausea or vomiting Anesthetic complications: no   No complications documented.  Last Vitals:  Vitals:   02/18/21 1130 02/18/21 1140  BP: 114/78 122/87  Pulse: 64 (!) 57  Resp: 15 16  Temp: 36.6 C   SpO2: 100% 100%    Last Pain:  Vitals:   02/18/21 1130  TempSrc:   PainSc: 0-No pain   Pain Goal:                Epidural/Spinal Function Cutaneous sensation: Able to Discern Pressure (02/18/21 1130), Patient able to flex knees: Yes (02/18/21 1130), Patient able to lift hips off bed: No (02/18/21 1130), Back pain beyond tenderness at insertion site: No (02/18/21 1130), Progressively worsening motor and/or sensory loss: No (02/18/21 1130), Bowel and/or bladder incontinence post epidural: No (02/18/21 1130)  Lowella Curb

## 2021-02-18 NOTE — Discharge Summary (Signed)
Postpartum Discharge Summary     Patient Name: Marcia Bryant DOB: 1986-10-03 MRN: [id number]  Date of admission: 02/18/2021 Delivery date:02/18/2021  Delivering provider: Truett Mainland  Date of discharge: 02/20/2021  Admitting diagnosis: Status post repeat low transverse cesarean section [Z98.891] Intrauterine pregnancy: [redacted]w[redacted]d     Secondary diagnosis:  Active Problems:   Obesity in pregnancy   History of pre-eclampsia   History of shoulder dystocia in prior pregnancy   Supervision of other normal pregnancy, antepartum   History of cesarean delivery   Placenta previa antepartum in second trimester   Gestational diabetes mellitus (GDM) affecting pregnancy, antepartum   Status post repeat low transverse cesarean section  Additional problems: None    Discharge diagnosis: Term Pregnancy Delivered and GDM A2                                              Post partum procedures:None Augmentation: N/A Complications: None  Hospital course: Scheduled C/S   35 y.o. yo [id number] at [redacted]w[redacted]d was admitted to the hospital 02/18/2021 for scheduled cesarean section with the following indication:Elective Repeat with bilateral salpingectomy.Delivery details are as follows:  Membrane Rupture Time/Date: 9:47 AM ,02/18/2021   Delivery Method:C-Section, Low Transverse  Details of operation can be found in separate operative note.  Patient had an uncomplicated postpartum course. Her Hgb on POD#1 was 10.6 and had been 11.0 on admission. She is ambulating, tolerating a regular diet, passing flatus, and urinating well. Patient is discharged home in stable condition on  02/20/21        Newborn Data: Birth date:02/18/2021  Birth time:9:47 AM  Gender:Female  Living status:Living  Apgars:8 ,9  Weight:3485 g     Magnesium Sulfate received: No BMZ received: No Rhophylac:N/A MMR:N/A T-DaP:Given prenatally Flu: N/A  Given prenatally Transfusion:No  Physical exam  Vitals:   02/19/21 1501 02/19/21  2144 02/20/21 0510 02/20/21 0820  BP: (!) 137/92 120/76 130/78 130/86  Pulse: 65 65 75 71  Resp: $Remo'18 16 18 20  'KhVDd$ Temp: 98.2 F (36.8 C) 98.3 F (36.8 C) 98.3 F (36.8 C) 97.8 F (36.6 C)  TempSrc: Oral Oral Oral Oral  SpO2:  99% 100% 100%  Weight:      Height:       General: alert and cooperative Lochia: appropriate Uterine Fundus: firm Incision: honeycomb intact, stained and unchanged DVT Evaluation: No evidence of DVT seen on physical exam. Labs: Lab Results  Component Value Date   WBC 9.2 02/20/2021   HGB 10.6 (L) 02/20/2021   HCT 32.7 (L) 02/20/2021   MCV 82.4 02/20/2021   PLT 229 02/20/2021   CMP Latest Ref Rng & Units 02/20/2021  Glucose 70 - 99 mg/dL 145(H)  BUN 6 - 20 mg/dL 5(L)  Creatinine 0.44 - 1.00 mg/dL 0.65  Sodium 135 - 145 mmol/L 139  Potassium 3.5 - 5.1 mmol/L 3.5  Chloride 98 - 111 mmol/L 104  CO2 22 - 32 mmol/L 25  Calcium 8.9 - 10.3 mg/dL 8.6(L)  Total Protein 6.5 - 8.1 g/dL 5.1(L)  Total Bilirubin 0.3 - 1.2 mg/dL 0.8  Alkaline Phos 38 - 126 U/L 84  AST 15 - 41 U/L 21  ALT 0 - 44 U/L 14   Edinburgh Score: Edinburgh Postnatal Depression Scale Screening Tool 02/19/2021  I have been able to laugh and see the funny side of things. 0  I have looked forward with enjoyment to things. 0  I have blamed myself unnecessarily when things went wrong. 2  I have been anxious or worried for no good reason. 2  I have felt scared or panicky for no good reason. 2  Things have been getting on top of me. 0  I have been so unhappy that I have had difficulty sleeping. 0  I have felt sad or miserable. 0  I have been so unhappy that I have been crying. 0  The thought of harming myself has occurred to me. 0  Edinburgh Postnatal Depression Scale Total 6     After visit meds:  Allergies as of 02/20/2021   No Known Allergies     Medication List    STOP taking these medications   Accu-Chek Guide w/Device Kit   Accu-Chek Softclix Lancets lancets   aspirin 81 MG  chewable tablet   Blood Pressure Monitor Kit   Breast Pump Misc   cyclobenzaprine 10 MG tablet Commonly known as: FLEXERIL   glucose blood test strip   metFORMIN 500 MG tablet Commonly known as: GLUCOPHAGE     TAKE these medications   ibuprofen 800 MG tablet Commonly known as: ADVIL Take 1 tablet (800 mg total) by mouth every 8 (eight) hours as needed.   oxyCODONE 5 MG immediate release tablet Commonly known as: Oxy IR/ROXICODONE Take 1-2 tablets (5-10 mg total) by mouth every 4 (four) hours as needed for moderate pain.   prenatal multivitamin Tabs tablet Take 1 tablet by mouth daily.        Discharge home in stable condition Infant Feeding: Bottle and Breast Infant Disposition:home with mother Discharge instruction: per After Visit Summary and Postpartum booklet. Activity: Advance as tolerated. Pelvic rest for 6 weeks.  Diet: routine diet Future Appointments: Future Appointments  Date Time Provider [place]  02/26/2021  1:20 PM [place] None  04/01/2021  8:45 AM [facility] None  04/01/2021  9:00 AM Constant, Peggy, MD [facility] None     Follow up Visit:  Please schedule this patient for a In person postpartum visit in 6 weeks with the following provider: Any provider. Additional Postpartum F/U:Incision check 1 week  High risk pregnancy complicated by: GDM Delivery mode:  C-Section, Low Transverse  Anticipated Birth Control:  Intraoperative bilateral salpingectomy   02/20/2021 Myrtis Ser, CNM  9:25 AM

## 2021-02-18 NOTE — Op Note (Signed)
Marcia Bryant PROCEDURE DATE: 02/18/2021  PREOPERATIVE DIAGNOSES: Intrauterine pregnancy at [redacted]w[redacted]d weeks gestation; undesired fertility  POSTOPERATIVE DIAGNOSES: The same  PROCEDURE: Repeat Low Transverse Cesarean Section with bilateral salpingectomy  SURGEON:  Dr. Candelaria Celeste  ASSISTANT:  Dr. Jen Mow  ANESTHESIOLOGY TEAM: Anesthesiologist: Lowella Curb, MD CRNA: Renford Dills, CRNA  INDICATIONS: Marcia Bryant is a 35 y.o. [phone] at [redacted]w[redacted]d here for cesarean section secondary to the indications listed under preoperative diagnoses; please see preoperative note for further details.  The risks of cesarean section were discussed with the patient including but were not limited to: bleeding which may require transfusion or reoperation; infection which may require antibiotics; injury to bowel, bladder, ureters or other surrounding organs; injury to the fetus; need for additional procedures including hysterectomy in the event of a life-threatening hemorrhage; placental abnormalities wth subsequent pregnancies, incisional problems, thromboembolic phenomenon and other postoperative/anesthesia complications.   The patient concurred with the proposed plan, giving informed written consent for the procedure.    FINDINGS:  Viable female infant in cephalic presentation.  Apgars 8 and 9.  Clear amniotic fluid.  Intact placenta, three vessel cord.  Normal uterus, fallopian tubes and ovaries bilaterally.  ANESTHESIA: Spinal  INTRAVENOUS FLUIDS: 2000 ml   ESTIMATED BLOOD LOSS: 224 ml URINE OUTPUT:  500 ml SPECIMENS: Placenta sent to L&D; Right and left fallopian tubes sent to pathology. COMPLICATIONS: None immediate  PROCEDURE IN DETAIL:  The patient preoperatively received intravenous antibiotics and had sequential compression devices applied to her lower extremities.  She was then taken to the operating room where spinal anesthesia was administered and was found to be adequate.  She was then placed in a dorsal supine position with a leftward tilt, and prepped and draped in a sterile manner.  A foley catheter was placed into her bladder and attached to constant gravity.  After an adequate timeout was performed, a Pfannenstiel skin incision was made with scalpel on her preexisting scar and carried through to the underlying layer of fascia. The fascia was incised in the midline, and this incision was extended bilaterally using the Mayo scissors.  Kocher clamps were applied to the superior aspect of the fascial incision and the underlying rectus muscles were dissected off bluntly and sharply.  A similar process was carried out on the inferior aspect of the fascial incision. The rectus muscles were separated in the midline and the peritoneum was entered bluntly. The Alexis self-retaining retractor was introduced into the abdominal cavity. A bladder flap was created with metzenbaum scissors in the usual fashion. Attention was turned to the lower uterine segment where a low transverse hysterotomy was made with a scalpel and extended bilaterally bluntly.  The infant was successfully delivered, the cord was clamped and cut after one minute, and the infant was handed over to the awaiting neonatology team. Uterine massage was then administered, and the placenta delivered intact with a three-vessel cord. The uterus was then cleared of clots and debris.  The hysterotomy was closed with 0 Vicryl in a running locked fashion with good hemostasis.   Attention was turned to the right fallopian tube, in which a salpingectomy was performed using the LigaSure method. Good hemostasis was noted. Attention now turned to the left fallopian tube, a salpingectomy was performed also using the LigaSure method, with good hemostasis noted. Both segments sent to pathology.  The pelvis was cleared of all clot and debris. Hemostasis was confirmed on all surfaces.  The retractor was removed.  The peritoneum  was closed  with a 0 Vicryl running stitch and the peritoneum were reapproximated using 0 Vicryl running stitch. The fascia was then closed using 0 Vicryl in a running fashion.  The subcutaneous layer was irrigated, and the skin was closed with a 4-0 Vicryl subcuticular stitch. The patient tolerated the procedure well. Sponge, instrument and needle counts were correct x 3.  She was taken to the recovery room in stable condition.    Jen Mow, DO OB Faculty Practice Center for [employer], [facility]

## 2021-02-18 NOTE — Lactation Note (Signed)
This note was copied from a baby's chart. Lactation Consultation Note  Patient Name: Marcia Bryant JJHER'D Date: 02/18/2021 Reason for consult: Initial assessment;Term;Maternal endocrine disorder Age:34 hours  Visited with mom of 34 hours old FT female, she's a P4 but not very experienced BF; she self reported having a low supply. Assisted mom with hand expression and noticed she has flat nipples. Baby was getting her second serum glucose at the time of [facility] consultation, the first one was at 52 mg/dL, mom had GDM.  Offered assistance with latch, and mom agreed to wake baby up to feed. Baby not showing any feeding cues and not even opening her mouth to latch, she was sleepy. LC assisted with more hand expression and rubbed colostrum in baby's mouth, she was very spitty and gaggy when tried to finger feed. An attempt was documented in flowsheets  Mom doing both, breast and formula but doesn't have a pump at home. Explained her options for hospital pumping (DEBP and hand pump) and she chose to be set up with a DEBP. Reviewed normal newborn behavior, feeding cues, benefits of STS care, newborn hypoglycemia, supplementation guidelines and pumping schedule.  Mom was falling asleep during [facility] consultation, mom may need some re-education at a later time; she was very tired. She was still double pumping when LC exited the room.  Feeding plan:  1. Encouraged mom to feed baby STS 8-12 times/24 hours or sooner if feeding cues are present 2. Hand expression and spoon feeding were also encouraged 3. She'll start pumping every 3 hours and will offer any amount of EBM she may get 4. Mom will continue supplementing baby with EBM/formula per feeding choice on admission  BF brochure, BF resources, feeding diary and supplementation handout were reviewed. No support person at the time of [facility] consultation. Mom reported all questions and concerns were answered, she's aware of LC OP services and will call  PRN.   Maternal Data Has patient been taught Hand Expression?: Yes Does the patient have breastfeeding experience prior to this delivery?: Yes How long did the patient breastfeed?: 2-3 months  Feeding Mother's Current Feeding Choice: Breast Milk and Formula  LATCH Score                    Lactation Tools Discussed/Used Tools: Pump Breast pump type: Double-Electric Breast Pump Pump Education: Setup, frequency, and cleaning;Milk Storage Reason for Pumping: Mother's request, she's GDM, flat nipples Pumping frequency: q 3 hours or as needed (she's doing both, breast and formula)  Interventions Interventions: Breast feeding basics reviewed;Assisted with latch;Skin to skin;Breast massage;Hand express;Breast compression;DEBP;Adjust position  Discharge Pump: DEBP WIC Program: Yes  Consult Status Consult Status: Follow-up Date: 02/19/21 Follow-up type: In-patient    Marcia Bryant Marcia Bryant 02/18/2021, 1:43 PM

## 2021-02-18 NOTE — Transfer of Care (Signed)
Immediate Anesthesia Transfer of Care Note  Patient: Marcia Bryant  Procedure(s) Performed: CESAREAN SECTION WITH BILATERAL TUBAL LIGATION (N/A )  Patient Location: PACU  Anesthesia Type:Spinal  Level of Consciousness: awake  Airway & Oxygen Therapy: Patient Spontanous Breathing  Post-op Assessment: Report given to RN and Post -op Vital signs reviewed and stable  Post vital signs: Reviewed and stable  Last Vitals:  Vitals Value Taken Time  BP 114/80 02/18/21 1032  Temp    Pulse 60 02/18/21 1035  Resp 22 02/18/21 1035  SpO2 100 % 02/18/21 1035  Vitals shown include unvalidated device data.  Last Pain:  Vitals:   02/18/21 0735  TempSrc: Oral         Complications: No complications documented.

## 2021-02-18 NOTE — H&P (Signed)
[place] Rodman Pickle is a 35 y.o. female presenting for repeat cesarean with bilateral tubal ligation.  OB History    Gravida  5   Para  3   Term  3   Preterm      AB  1   Living  3     SAB  1   IAB      Ectopic      Multiple  0   Live Births  3          Past Medical History:  Diagnosis Date  . Bacterial vaginosis   . Diabetes mellitus without complication (HCC)   . Gestational diabetes   . Preeclampsia 01/25/2013  . Umbilical hernia 05/20/2016  . Yeast infection    Past Surgical History:  Procedure Laterality Date  . CESAREAN SECTION N/A 12/13/2016   Procedure: CESAREAN SECTION;  Surgeon: Adam Phenix, MD;  Location: [facility] BIRTHING SUITES;  Service: Obstetrics;  Laterality: N/A;   Family History: family history includes Diabetes in her mother; Hypertension in her father and paternal grandmother. Social History:  reports that she has never smoked. She has never used smokeless tobacco. She reports that she does not drink alcohol and does not use drugs.     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 Maternal Ultrasounds/Referrals: Normal Fetal Ultrasounds or other Referrals:  Referred to Materal Fetal Medicine  Maternal Substance Abuse:  No Significant Maternal Medications:  Meds include: Other:   Metformin Significant Maternal Lab Results:  None Other Comments:  None  Review of Systems  Constitutional: Negative for chills and fever.  Respiratory: Negative for cough and shortness of breath.   Genitourinary: Negative for vaginal bleeding.   History   Blood pressure (!) 139/98, pulse 80, temperature 98.2 F (36.8 C), temperature source Oral, resp. rate 16, height 5\' 6"  (1.676 m), weight 96.6 kg, last menstrual period 05/14/2020, SpO2 100 %. Exam Physical Exam HENT:     Head: Normocephalic and atraumatic.   Cardiovascular:     Rate and Rhythm: Normal rate.  Pulmonary:     Effort: Pulmonary effort is normal.  Abdominal:     Tenderness: There is no abdominal tenderness.     Comments: gravid  Musculoskeletal:        General: No swelling.     Right lower leg: No edema.     Left lower leg: No edema.  Skin:    Findings: No rash.  Neurological:     Mental Status: She is alert.     Prenatal labs: ABO, Rh: --/--/O POS (04/11 01-05-1972) Antibody: NEG (04/11 0951) Rubella: 4.60 (09/29 1153) RPR: Non Reactive (01/24 1008)  HBsAg: Negative (09/29 1153)  HIV: NON REACTIVE (04/11 0942)  GBS: Negative/-- (03/24 1004)   Assessment/Plan: 04-20-1984 Osei Danquah is a 35 y.o. 32 at [redacted]w[redacted]d by LMP c/w 6 week [redacted]w[redacted]d here for repeat cesarean with bilateral tubal ligation.  #Labor:  RLTCS with bilateral tubal ligation. #Pain: Spinal #FWB:  Doppler normal #ID:  GBS Neg  #MOF: breast and bottle #MOC: tubal ligation     [place], DO 02/18/2021, 8:47 AM

## 2021-02-18 NOTE — Anesthesia Preprocedure Evaluation (Signed)
Anesthesia Evaluation  Patient identified by MRN, date of birth, ID band Patient awake    Reviewed: Allergy & Precautions, NPO status , Patient's Chart, lab work & pertinent test results  Airway Mallampati: II  TM Distance: >3 FB Neck ROM: Full    Dental  (+) Teeth Intact, Dental Advisory Given   Pulmonary neg pulmonary ROS,    Pulmonary exam normal breath sounds clear to auscultation       Cardiovascular hypertension, negative cardio ROS Normal cardiovascular exam Rhythm:Regular Rate:Normal     Neuro/Psych negative neurological ROS     GI/Hepatic negative GI ROS, Neg liver ROS,   Endo/Other  diabetes, Well Controlled, GestationalObesity   Renal/GU negative Renal ROS     Musculoskeletal negative musculoskeletal ROS (+)   Abdominal   Peds  Hematology negative hematology ROS (+) Plt 222k   Anesthesia Other Findings Day of surgery medications reviewed with the patient.  Reproductive/Obstetrics (+) Pregnancy Pre-eclampsia with prior pregnancy                              Anesthesia Physical  Anesthesia Plan  ASA: II  Anesthesia Plan: Spinal   Post-op Pain Management:    Induction:   PONV Risk Score and Plan:   Airway Management Planned: Natural Airway  Additional Equipment:   Intra-op Plan:   Post-operative Plan:   Informed Consent: I have reviewed the patients History and Physical, chart, labs and discussed the procedure including the risks, benefits and alternatives for the proposed anesthesia with the patient or authorized representative who has indicated his/her understanding and acceptance.     Dental advisory given  Plan Discussed with:   Anesthesia Plan Comments: (Patient identified. Risks/Benefits/Options discussed with patient including but not limited to bleeding, infection, nerve damage, paralysis, failed block, incomplete pain control, headache, blood  pressure changes, nausea, vomiting, reactions to medication both or allergic, itching and postpartum back pain. Confirmed with bedside nurse the patient's most recent platelet count. Confirmed with patient that they are not currently taking any anticoagulation, have any bleeding history or any family history of bleeding disorders. Patient expressed understanding and wished to proceed. All questions were answered.   )        Anesthesia Quick Evaluation

## 2021-02-19 LAB — CBC
HCT: 33.4 % — ABNORMAL LOW (ref 36.0–46.0)
Hemoglobin: 11 g/dL — ABNORMAL LOW (ref 12.0–15.0)
MCH: 27.4 pg (ref 26.0–34.0)
MCHC: 32.9 g/dL (ref 30.0–36.0)
MCV: 83.3 fL (ref 80.0–100.0)
Platelets: 228 10*3/uL (ref 150–400)
RBC: 4.01 MIL/uL (ref 3.87–5.11)
RDW: 13.8 % (ref 11.5–15.5)
WBC: 11.5 10*3/uL — ABNORMAL HIGH (ref 4.0–10.5)
nRBC: 0 % (ref 0.0–0.2)

## 2021-02-19 LAB — BIRTH TISSUE RECOVERY COLLECTION (PLACENTA DONATION)

## 2021-02-19 LAB — GLUCOSE, CAPILLARY: Glucose-Capillary: 78 mg/dL (ref 70–99)

## 2021-02-19 LAB — GLUCOSE, RANDOM: Glucose, Bld: 80 mg/dL (ref 70–99)

## 2021-02-19 NOTE — Progress Notes (Addendum)
Subjective: Postpartum Day 1: Cesarean Delivery  Patient is doing well. Only complaint is that she feels itchy. Rates her itchiness a 3/10 and it is mildly bothersome. Ambulating without difficulty. Voiding and passing flatus. Tolerating PO. Abdominal pain improved. Pain is rated 0/10. Vaginal bleeding described as a light period.    Objective: Vital signs in last 24 hours: Temp:  [97.6 F (36.4 C)-99.3 F (37.4 C)] 98.1 F (36.7 C) (04/13 0517) Pulse Rate:  [52-77] 52 (04/13 0517) Resp:  [15-23] 18 (04/13 0517) BP: (112-133)/(71-87) 116/74 (04/13 0517) SpO2:  [98 %-100 %] 100 % (04/13 0517)  Physical Exam:  General: alert, cooperative and no distress.  Lochia: appropriate Uterine Fundus: firm below umbilicus Incision: Clear, dry, and intact. DVT Evaluation: No evidence of DVT seen on physical exam.  Recent Labs    02/17/21 0942 02/19/21 0543  HGB 12.9 11.0*  HCT 37.7 33.4*    Assessment/Plan: Status post Cesarean section. POD#1. Doing well postoperatively.  #History of pre-eclampsia - continue to monitor BP. #Anemia (hgb 11.0) - continue pre-natal vitamins. #GDM (MTF) - AM cbg 78, will follow up as OP. #MOF: breast and bottle #MOC: s/p tubal ligation   #Continue current care. DC POD2-3.  Tenna Child Rosado 02/19/2021, 8:02 AM   I saw and evaluated the patient and repeated all pertinent components of HPI and physical. I agree with the findings and the plan of care as documented in the medical student's note. I have made any necessary edits above.   Casper Harrison, MD [facility] Family Medicine Fellow, [facility], [facility]

## 2021-02-20 LAB — CBC
HCT: 32.7 % — ABNORMAL LOW (ref 36.0–46.0)
Hemoglobin: 10.6 g/dL — ABNORMAL LOW (ref 12.0–15.0)
MCH: 26.7 pg (ref 26.0–34.0)
MCHC: 32.4 g/dL (ref 30.0–36.0)
MCV: 82.4 fL (ref 80.0–100.0)
Platelets: 229 10*3/uL (ref 150–400)
RBC: 3.97 MIL/uL (ref 3.87–5.11)
RDW: 13.5 % (ref 11.5–15.5)
WBC: 9.2 10*3/uL (ref 4.0–10.5)
nRBC: 0 % (ref 0.0–0.2)

## 2021-02-20 LAB — COMPREHENSIVE METABOLIC PANEL
ALT: 14 U/L (ref 0–44)
AST: 21 U/L (ref 15–41)
Albumin: 2.2 g/dL — ABNORMAL LOW (ref 3.5–5.0)
Alkaline Phosphatase: 84 U/L (ref 38–126)
Anion gap: 10 (ref 5–15)
BUN: 5 mg/dL — ABNORMAL LOW (ref 6–20)
CO2: 25 mmol/L (ref 22–32)
Calcium: 8.6 mg/dL — ABNORMAL LOW (ref 8.9–10.3)
Chloride: 104 mmol/L (ref 98–111)
Creatinine, Ser: 0.65 mg/dL (ref 0.44–1.00)
GFR, Estimated: >60 mL/min (ref >60)
Glucose, Bld: 145 mg/dL — ABNORMAL HIGH (ref 70–99)
Potassium: 3.5 mmol/L (ref 3.5–5.1)
Sodium: 139 mmol/L (ref 135–145)
Total Bilirubin: 0.8 mg/dL (ref 0.3–1.2)
Total Protein: 5.1 g/dL — ABNORMAL LOW (ref 6.5–8.1)

## 2021-02-20 LAB — SURGICAL PATHOLOGY

## 2021-02-20 MED ORDER — IBUPROFEN 800 MG PO TABS: 800.0000 mg | ORAL_TABLET | Freq: Three times a day (TID) | ORAL | 0 refills | Status: DC | PRN

## 2021-02-20 MED ORDER — ACETAMINOPHEN 325 MG PO TABS
650.0000 mg | ORAL_TABLET | ORAL | Status: DC | PRN
  Administered 2021-02-20: 650 mg via ORAL
  Filled 2021-02-20: qty 2

## 2021-02-20 MED ORDER — OXYCODONE HCL 5 MG PO TABS: 5.0000 mg | ORAL_TABLET | ORAL | 0 refills | Status: DC | PRN

## 2021-02-20 NOTE — Discharge Instructions (Signed)
Cesarean Delivery, Care After This sheet gives you information about how to care for yourself after your procedure. Your health care provider may also give you more specific instructions. If you have problems or questions, contact your health care provider. What can I expect after the procedure? After the procedure, it is common to have:  A small amount of blood or clear fluid coming from the incision.  Some redness, swelling, and pain in your incision area.  Some abdominal pain and soreness.  Vaginal bleeding (lochia). Even though you did not have a vaginal delivery, you will still have vaginal bleeding and discharge.  Pelvic cramps.  Fatigue. You may have pain, swelling, and discomfort in the tissue between your vagina and your anus (perineum) if:  Your C-section was unplanned, and you were allowed to labor and push.  An incision was made in the area (episiotomy) or the tissue tore during attempted vaginal delivery. Follow these instructions at home: Incision care  Follow instructions from your health care provider about how to take care of your incision. Make sure you: ? Wash your hands with soap and water before you change your bandage (dressing). If soap and water are not available, use hand sanitizer. ? If you have a dressing, change it or remove it as told by your health care provider. ? Leave stitches (sutures), skin staples, skin glue, or adhesive strips in place. These skin closures may need to stay in place for 2 weeks or longer. If adhesive strip edges start to loosen and curl up, you may trim the loose edges. Do not remove adhesive strips completely unless your health care provider tells you to do that.  Check your incision area every day for signs of infection. Check for: ? More redness, swelling, or pain. ? More fluid or blood. ? Warmth. ? Pus or a bad smell.  Do not take baths, swim, or use a hot tub until your health care provider says it's okay. Ask your health  care provider if you can take showers.  When you cough or sneeze, hug a pillow. This helps with pain and decreases the chance of your incision opening up (dehiscing). Do this until your incision heals.   Medicines  Take over-the-counter and prescription medicines only as told by your health care provider.  If you were prescribed an antibiotic medicine, take it as told by your health care provider. Do not stop taking the antibiotic even if you start to feel better.  Do not drive or use heavy machinery while taking prescription pain medicine. Lifestyle  Do not drink alcohol. This is especially important if you are breastfeeding or taking pain medicine.  Do not use any products that contain nicotine or tobacco, such as cigarettes, e-cigarettes, and chewing tobacco. If you need help quitting, ask your health care provider. Eating and drinking  Drink at least 8 eight-ounce glasses of water every day unless told not to by your health care provider. If you breastfeed, you may need to drink even more water.  Eat high-fiber foods every day. These foods may help prevent or relieve constipation. High-fiber foods include: ? Whole grain cereals and breads. ? Brown rice. ? Beans. ? Fresh fruits and vegetables. Activity  If possible, have someone help you care for your baby and help with household activities for at least a few days after you leave the hospital.  Return to your normal activities as told by your health care provider. Ask your health care provider what activities are safe for   you.  Rest as much as possible. Try to rest or take a nap while your baby is sleeping.  Do not lift anything that is heavier than 10 lbs (4.5 kg), or the limit that you were told, until your health care provider says that it is safe.  Talk with your health care provider about when you can engage in sexual activity. This may depend on your: ? Risk of infection. ? How fast you heal. ? Comfort and desire to  engage in sexual activity.   General instructions  Do not use tampons or douches until your health care provider approves.  Wear loose, comfortable clothing and a supportive and well-fitting bra.  Keep your perineum clean and dry. Wipe from front to back when you use the toilet.  If you pass a blood clot, save it and call your health care provider to discuss. Do not flush blood clots down the toilet before you get instructions from your health care provider.  Keep all follow-up visits for you and your baby as told by your health care provider. This is important. Contact a health care provider if:  You have: ? A fever. ? Bad-smelling vaginal discharge. ? Pus or a bad smell coming from your incision. ? Difficulty or pain when urinating. ? A sudden increase or decrease in the frequency of your bowel movements. ? More redness, swelling, or pain around your incision. ? More fluid or blood coming from your incision. ? A rash. ? Nausea. ? Little or no interest in activities you used to enjoy. ? Questions about caring for yourself or your baby.  Your incision feels warm to the touch.  Your breasts turn red or become painful or hard.  You feel unusually sad or worried.  You vomit.  You pass a blood clot from your vagina.  You urinate more than usual.  You are dizzy or light-headed. Get help right away if:  You have: ? Pain that does not go away or get better with medicine. ? Chest pain. ? Difficulty breathing. ? Blurred vision or spots in your vision. ? Thoughts about hurting yourself or your baby. ? New pain in your abdomen or in one of your legs. ? A severe headache.  You faint.  You bleed from your vagina so much that you fill more than one sanitary pad in one hour. Bleeding should not be heavier than your heaviest period. Summary  After the procedure, it is common to have pain at your incision site, abdominal cramping, and slight bleeding from your vagina.  Check  your incision area every day for signs of infection.  Tell your health care provider about any unusual symptoms.  Keep all follow-up visits for you and your baby as told by your health care provider. This information is not intended to replace advice given to you by your health care provider. Make sure you discuss any questions you have with your health care provider. Document Revised: 05/04/2018 Document Reviewed: 05/04/2018 Elsevier Patient Education  2021 Elsevier Inc.  

## 2021-02-20 NOTE — Lactation Note (Signed)
This note was copied from a baby's chart. Lactation Consultation Note  Patient Name: Marcia Bryant Date: 02/20/2021   Age:34 hours  Per Lavell Luster, RN, Mom has elected to formula feed until she goes home and her milk has come in.   No need for Lactation visit at this time, unless RN informs IBCLC otherwise prior to discharge.   Lurline Hare [facility] 02/20/2021, 9:28 AM

## 2021-02-20 NOTE — Lactation Note (Signed)
This note was copied from a baby's chart. Lactation Consultation Note  Patient Name: Marcia Bryant [id number] Date: 02/20/2021   Age:34 hours  Telephone call from RN Christella Hartigan, that mom needs her [employer] Breastpump. Insurance on screen shows Medicaid as primary however mom has a [employer] insurance card dated 08/29/20. Mom reports that Medicaid is only for help during pregnancy.  Issued mom a Medela pump in style Maxflow.  Gave her copy of receipt.  Urged her to pump every time baby was fed and not at the breast.  Urged her to follow up with lactation as needed.  Maternal Data    Feeding Nipple Type: Regular  LATCH Score                    Lactation Tools Discussed/Used    Interventions    Discharge    Consult Status      Marcia Bryant Michaelle Copas 02/20/2021, 1:47 PM

## 2021-02-21 ENCOUNTER — Telehealth: Payer: Self-pay | Admitting: *Deleted

## 2021-02-21 NOTE — Telephone Encounter (Signed)
Transition Care Management Unsuccessful Follow-up Telephone Call  Date of discharge and from where:  02/20/2021 - [facility]  Attempts:  1st Attempt  Reason for unsuccessful TCM follow-up call:  Left voice message

## 2021-02-24 ENCOUNTER — Ambulatory Visit (INDEPENDENT_AMBULATORY_CARE_PROVIDER_SITE_OTHER): Payer: Medicaid Other | Admitting: Family Medicine

## 2021-02-24 ENCOUNTER — Other Ambulatory Visit: Payer: Self-pay

## 2021-02-24 VITALS — BP 139/89 | HR 73 | Wt 203.0 lb

## 2021-02-24 DIAGNOSIS — Z8759 Personal history of other complications of pregnancy, childbirth and the puerperium: Secondary | ICD-10-CM | POA: Diagnosis not present

## 2021-02-24 DIAGNOSIS — Z98891 History of uterine scar from previous surgery: Secondary | ICD-10-CM | POA: Diagnosis not present

## 2021-02-24 NOTE — Progress Notes (Signed)
Incision check  S: Patient presents for bleeding from incision site/1 week incision check post op. Noticed bleeding this morning. Denies any abnormal drainage, fevers, odor. Additionally patient noted to be tearful on exam. Does not wish to discuss further. Denies SI/HI.  O: Vitals:   02/24/21 1340  BP: 139/89  Pulse: 73   Gen: well appearing, NAD Lungs: normal respiratory effort Heart: regular rate Abdomen: fundus firm Incision: honeycomb dressing saturated in dried blood, upon removal incision without erythema, odor, induration or drainage Extremities: no edema  A/P:  POD#6 c/s -VSS, afebrile -incision site well appearing after dressing removal -reassurance provided  Elevated BP -no history of HTN -BP elevated today, likely in the setting of patient being tearful/upset -would like to defer medication at this time -asymptomatic -will make appt to follow up in 1 week  Postpartum grief -tearful on exam -declines BH or medication -contracts for safety  Alric Seton, MD OB Fellow, Faculty Practice [facility], [facility] 02/24/2021 2:03 PM

## 2021-02-24 NOTE — Telephone Encounter (Signed)
Transition Care Management Follow-up Telephone Call  Date of discharge and from where: 02/20/2021 - [facility]  How have you been since you were released from the hospital? "Fine"  Any questions or concerns? No  Items Reviewed:  Did the pt receive and understand the discharge instructions provided? Yes   Medications obtained and verified? Yes   Other? No   Any new allergies since your discharge? No   Dietary orders reviewed? No  Do you have support at home? Yes     Functional Questionnaire: (I = Independent and D = Dependent) ADLs: I  Bathing/Dressing- I  Meal Prep- I  Eating- I  Maintaining continence- I  Transferring/Ambulation- I  Managing Meds- I  Follow up appointments reviewed:   PCP Hospital f/u appt confirmed? No    Specialist Hospital f/u appt confirmed? Yes  Scheduled to see OBGYN on 4/20/20222 @ 1320 and 04/01/2021 @ 0900.  Are transportation arrangements needed? No   If their condition worsens, is the pt aware to call PCP or go to the Emergency Dept.? Yes  Was the patient provided with contact information for the PCP's office or ED? Yes  Was to pt encouraged to call back with questions or concerns? Yes

## 2021-02-24 NOTE — Progress Notes (Signed)
Pt is in office for incision check, she is 6 days post c/s.  Pt has noticed increase in bleeding from incision today.   EPDS score 11 today. Pt states she is sad and overwhelmed, states she does have help at home.

## 2021-02-26 ENCOUNTER — Ambulatory Visit: Payer: Medicaid Other

## 2021-02-28 ENCOUNTER — Ambulatory Visit: Payer: Medicaid Other

## 2021-03-03 ENCOUNTER — Other Ambulatory Visit: Payer: Self-pay

## 2021-03-03 ENCOUNTER — Ambulatory Visit (INDEPENDENT_AMBULATORY_CARE_PROVIDER_SITE_OTHER): Payer: Medicaid Other

## 2021-03-03 VITALS — BP 127/76 | HR 62

## 2021-03-03 DIAGNOSIS — Z0130 Encounter for examination of blood pressure without abnormal findings: Secondary | ICD-10-CM

## 2021-03-03 NOTE — Progress Notes (Addendum)
Subjective:  Marcia Bryant is a 35 y.o. female here for BP check s/p TVCS on 02/18/2021. No hx of HTN. BP was elevated last visit likely in the setting of being tearful/upset.  Hypertension ROS: no medication side effects noted, no TIA's, no chest pain on exertion, no dyspnea on exertion and no swelling of ankles.   Objective:  BP 127/76 P 62 LMP 05/14/2020   Appearance alert, well appearing, and in no distress. General exam BP noted to be well controlled  Assessment:   Blood Pressure well controlled.   Plan:  Keep upcoming pp visit. Reiterated with pt to fast at midnight prior to pp 2 hr

## 2021-03-03 NOTE — Progress Notes (Signed)
Patient was assessed and managed by nursing staff during this encounter. I have reviewed the chart and agree with the documentation and plan. I have also made any necessary editorial changes.  Ryatt Corsino A Cardin Nitschke, MD 03/03/2021 5:31 PM   

## 2021-03-12 ENCOUNTER — Encounter: Payer: Self-pay | Admitting: *Deleted

## 2021-03-17 ENCOUNTER — Ambulatory Visit: Payer: Medicaid Other | Admitting: Licensed Clinical Social Worker

## 2021-04-01 ENCOUNTER — Other Ambulatory Visit (HOSPITAL_COMMUNITY)
Admission: RE | Admit: 2021-04-01 | Discharge: 2021-04-01 | Disposition: A | Discharge status: Home / Self Care | Payer: Medicaid Other | Source: Ambulatory Visit | Attending: Obstetrics and Gynecology | Admitting: Obstetrics and Gynecology

## 2021-04-01 ENCOUNTER — Ambulatory Visit (INDEPENDENT_AMBULATORY_CARE_PROVIDER_SITE_OTHER): Payer: Medicaid Other | Admitting: Obstetrics and Gynecology

## 2021-04-01 ENCOUNTER — Encounter: Payer: Self-pay | Admitting: Obstetrics and Gynecology

## 2021-04-01 ENCOUNTER — Other Ambulatory Visit: Payer: Medicaid Other

## 2021-04-01 ENCOUNTER — Other Ambulatory Visit: Payer: Self-pay

## 2021-04-01 NOTE — Progress Notes (Signed)
Reports some leakage of bloody fluid from CS site.

## 2021-04-01 NOTE — Progress Notes (Signed)
Post Partum Visit Note  Marcia Bryant is a 35 y.o. [phone] female who presents for a postpartum visit. She is 6 weeks postpartum following a repeat cesarean section.  I have fully reviewed the prenatal and intrapartum course. The delivery was at 39.1 gestational weeks.  Anesthesia: spinal. Postpartum course has been uncomplicated. Baby is doing well. Baby is feeding by both breast and bottle - Enfamil AR. Bleeding thin lochia. Bowel function is normal. Bladder function is normal. Patient is sexually active. Contraception method is tubal ligation.  Postpartum depression screening: negative.   The pregnancy intention screening data noted above was reviewed. Potential methods of contraception were discussed. The patient elected to proceed with Female Sterilization.    Edinburgh Postnatal Depression Scale - 04/01/21 0903      Edinburgh Postnatal Depression Scale:  In the Past 7 Days   I have been able to laugh and see the funny side of things. 0    I have looked forward with enjoyment to things. 0    I have blamed myself unnecessarily when things went wrong. 0    I have been anxious or worried for no good reason. 0    I have felt scared or panicky for no good reason. 0    Things have been getting on top of me. 0    I have been so unhappy that I have had difficulty sleeping. 0    I have felt sad or miserable. 0    I have been so unhappy that I have been crying. 0    The thought of harming myself has occurred to me. 0    Edinburgh Postnatal Depression Scale Total 0           Health Maintenance Due  Topic Date Due  . HEMOGLOBIN A1C  Never done  . PNEUMOCOCCAL POLYSACCHARIDE VACCINE AGE 71-64 HIGH RISK  Never done  . FOOT EXAM  Never done  . OPHTHALMOLOGY EXAM  Never done  . URINE MICROALBUMIN  Never done       Review of Systems Pertinent items noted in HPI and remainder of comprehensive ROS otherwise negative.  Objective:  BP 132/85   Pulse 66   Wt 195 lb (88.5 kg)    LMP 05/14/2020   BMI 31.47 kg/m    General:  alert, cooperative and no distress   Breasts:  normal  Lungs: clear to auscultation bilaterally  Heart:  regular rate and rhythm  Abdomen: soft, non-tender; bowel sounds normal; no masses,  no organomegaly   Wound well approximated incision  GU exam:  normal       Assessment:    There are no diagnoses linked to this encounter.  Norml postpartum exam.   Plan:   Essential components of care per ACOG recommendations:  1.  Mood and well being: Patient with negative depression screening today. Reviewed local resources for support.  - Patient tobacco use? No.   - hx of drug use? No.    2. Infant care and feeding:  -Patient currently breastmilk feeding? Yes. Reviewed importance of draining breast regularly to support lactation.  -Social determinants of health (SDOH) reviewed in EPIC. No concerns  3. Sexuality, contraception and birth spacing - Patient does not want a pregnancy in the next year.  Desired family size is 4 children.  - Patient had a bilateral salpingectomy at the time of her c-section  4. Sleep and fatigue -Encouraged family/partner/community support of 4 hrs of uninterrupted sleep to help with mood and  fatigue  5. Physical Recovery  - Discussed patients delivery and complications.  - Patient had a C-section repeat; no problems after deliver.  - Patient has urinary incontinence? No. - Patient is safe to resume physical and sexual activity  6.  Health Maintenance - HM due items addressed Yes - Last pap smear  Diagnosis  Date Value Ref Range Status  09/05/2018   Final   NEGATIVE FOR INTRAEPITHELIAL LESIONS OR MALIGNANCY. BENIGN REACTIVE/REPARATIVE CHANGES.   Pap smear done at today's visit.  -Breast Cancer screening indicated? No.   7. Chronic Disease/Pregnancy Condition follow up: Gestational Diabetes- 2 hour glucola today  - PCP follow up  Catalina Antigua, MD [facility] for [facility]

## 2021-04-02 LAB — GLUCOSE TOLERANCE, 2 HOURS
Glucose, 2 hour: 128 mg/dL (ref 65–139)
Glucose, GTT - Fasting: 89 mg/dL (ref 65–99)

## 2021-04-03 ENCOUNTER — Other Ambulatory Visit: Payer: Self-pay | Admitting: Obstetrics and Gynecology

## 2021-04-03 DIAGNOSIS — K429 Umbilical hernia without obstruction or gangrene: Secondary | ICD-10-CM

## 2021-04-03 LAB — CYTOLOGY - PAP
Comment: NEGATIVE
Diagnosis: NEGATIVE
High risk HPV: NEGATIVE

## 2021-04-15 ENCOUNTER — Encounter: Payer: Self-pay | Admitting: Surgery

## 2021-04-15 ENCOUNTER — Ambulatory Visit: Payer: Medicaid Other | Admitting: Surgery

## 2021-04-15 ENCOUNTER — Other Ambulatory Visit: Payer: Self-pay

## 2021-04-15 ENCOUNTER — Telehealth: Payer: Self-pay | Admitting: Surgery

## 2021-04-15 ENCOUNTER — Ambulatory Visit: Payer: Self-pay | Admitting: Surgery

## 2021-04-15 ENCOUNTER — Other Ambulatory Visit: Payer: Self-pay | Admitting: Obstetrics and Gynecology

## 2021-04-15 VITALS — BP 139/85 | HR 66 | Temp 98.3°F | Ht 66.0 in | Wt 198.4 lb

## 2021-04-15 DIAGNOSIS — M6208 Separation of muscle (nontraumatic), other site: Secondary | ICD-10-CM | POA: Diagnosis not present

## 2021-04-15 DIAGNOSIS — K429 Umbilical hernia without obstruction or gangrene: Secondary | ICD-10-CM | POA: Diagnosis not present

## 2021-04-15 NOTE — Patient Instructions (Signed)
You have requested for your Umbilical Hernia be repaired. This will be scheduled with Dr. Claudine Mouton at [facility].  Please see your (blue)pre-care sheet for information. Our surgery scheduler will call you to look at surgery dates and to go over information for surgery.   You will need to arrange to be off work for 1-2 weeks but will have to have a lifting restriction of no more than 15 lbs for 6 weeks following your surgery.   Umbilical Hernia, Adult A hernia is a bulge of tissue that pushes through an opening between muscles. An umbilical hernia happens in the abdomen, near the belly button (umbilicus). The hernia may contain tissues from the small intestine, large intestine, or fatty tissue covering the intestines (omentum). Umbilical hernias in adults tend to get worse over time, and they require surgical treatment. There are several types of umbilical hernias. You may have:  A hernia located just above or below the umbilicus (indirect hernia). This is the most common type of umbilical hernia in adults.  A hernia that forms through an opening formed by the umbilicus (direct hernia).  A hernia that comes and goes (reducible hernia). A reducible hernia may be visible only when you strain, lift something heavy, or cough. This type of hernia can be pushed back into the abdomen (reduced).  A hernia that traps abdominal tissue inside the hernia (incarcerated hernia). This type of hernia cannot be reduced.  A hernia that cuts off blood flow to the tissues inside the hernia (strangulated hernia). The tissues can start to die if this happens. This type of hernia requires emergency treatment.  What are the causes? An umbilical hernia happens when tissue inside the abdomen presses on a weak area of the abdominal muscles. What increases the risk? You may have a greater risk of this condition if you:  Are obese.  Have had several pregnancies.  Have a buildup of fluid inside your abdomen  (ascites).  Have had surgery that weakens the abdominal muscles.  What are the signs or symptoms? The main symptom of this condition is a painless bulge at or near the belly button. A reducible hernia may be visible only when you strain, lift something heavy, or cough. Other symptoms may include:  Dull pain.  A feeling of pressure.  Symptoms of a strangulated hernia may include:  Pain that gets increasingly worse.  Nausea and vomiting.  Pain when pressing on the hernia.  Skin over the hernia becoming red or purple.  Constipation.  Blood in the stool.  How is this diagnosed? This condition may be diagnosed based on:  A physical exam. You may be asked to cough or strain while standing. These actions increase the pressure inside your abdomen and force the hernia through the opening in your muscles. Your health care provider may try to reduce the hernia by pressing on it.  Your symptoms and medical history.  How is this treated? Surgery is the only treatment for an umbilical hernia. Surgery for a strangulated hernia is done as soon as possible. If you have a small hernia that is not incarcerated, you may need to lose weight before having surgery. Follow these instructions at home:  Lose weight, if told by your health care provider.  Do not try to push the hernia back in.  Watch your hernia for any changes in color or size. Tell your health care provider if any changes occur.  You may need to avoid activities that increase pressure on your hernia.  Do not lift anything that is heavier than 10 lb (4.5 kg) until your health care provider says that this is safe.  Take over-the-counter and prescription medicines only as told by your health care provider.  Keep all follow-up visits as told by your health care provider. This is important. Contact a health care provider if:  Your hernia gets larger.  Your hernia becomes painful. Get help right away if:  You develop sudden,  severe pain near the area of your hernia.  You have pain as well as nausea or vomiting.  You have pain and the skin over your hernia changes color.  You develop a fever. This information is not intended to replace advice given to you by your health care provider. Make sure you discuss any questions you have with your health care provider. Document Released: 03/27/2016 Document Revised: 06/28/2016 Document Reviewed: 03/27/2016 Elsevier Interactive Patient Education  [employer].

## 2021-04-15 NOTE — Progress Notes (Signed)
Patient ID: Marcia Bryant, female   DOB: September 08, 1986, 35 y.o.   MRN: [id number]  Chief Complaint: Umbilical hernia  History of Present Illness Marcia Bryant is a 35 y.o. female with an umbilical hernia that she has known since infancy.  It began to bother her with her pregnancies, that has increased in size and pain with her last pregnancy which was delivered 2 months ago.  She reports a significant protrusion readily apparent, some discomfort with lifting etc.  No history of obstruction, bowel habit changes or prior surgery.  Past Medical History Past Medical History:  Diagnosis Date  . Bacterial vaginosis   . Gestational diabetes   . Preeclampsia 01/25/2013  . Umbilical hernia 05/20/2016  . Yeast infection       Past Surgical History:  Procedure Laterality Date  . CESAREAN SECTION N/A 12/13/2016   Procedure: CESAREAN SECTION;  Surgeon: Adam Phenix, MD;  Location: [facility] BIRTHING SUITES;  Service: Obstetrics;  Laterality: N/A;  . CESAREAN SECTION WITH BILATERAL TUBAL LIGATION N/A 02/18/2021   Procedure: CESAREAN SECTION WITH BILATERAL TUBAL LIGATION;  Surgeon: Levie Heritage, DO;  Location: [facility] LD ORS;  Service: Obstetrics;  Laterality: N/A;    No Known Allergies  Current Outpatient Medications  Medication Sig Dispense Refill  . ibuprofen (ADVIL) 800 MG tablet Take 1 tablet (800 mg total) by mouth every 8 (eight) hours as needed. 30 tablet 0  . Prenatal Vit-Fe Fumarate-FA (PRENATAL MULTIVITAMIN) TABS tablet Take 1 tablet by mouth daily.     No current facility-administered medications for this visit.    Family History Family History  Problem Relation Age of Onset  . Hypertension Father   . Hypertension Paternal Grandmother   . Diabetes Mother       Social History Social History   Tobacco Use  . Smoking status: Never Smoker  . Smokeless tobacco: Never Used  Vaping Use  . Vaping Use: Never used  Substance Use Topics  . Alcohol use: No  . Drug use: No         Review of Systems  Constitutional: Negative.   HENT: Negative.   Eyes: Negative.   Respiratory: Negative.   Cardiovascular: Negative.   Gastrointestinal: Negative.   Genitourinary: Negative.   Skin: Negative.   Neurological: Negative.   Psychiatric/Behavioral: Negative.       Physical Exam Blood pressure 139/85, pulse 66, temperature 98.3 F (36.8 C), height 5\' 6"  (1.676 m), weight 198 lb 6.4 oz (90 kg), last menstrual period 04/07/2021, SpO2 98 %, not currently breastfeeding. Last Weight  Most recent update: 04/15/2021  9:31 AM   Weight  90 kg (198 lb 6.4 oz)            CONSTITUTIONAL: Well developed, and nourished, appropriately responsive and aware without distress.   EYES: Sclera non-icteric.   EARS, NOSE, MOUTH AND THROAT: Mask worn.   Hearing is intact to voice.  NECK: Trachea is midline, and there is no jugular venous distension.  LYMPH NODES:  Lymph nodes in the neck are not enlarged. RESPIRATORY:  Lungs are clear, and breath sounds are equal bilaterally. Normal respiratory effort without pathologic use of accessory muscles. CARDIOVASCULAR: Heart is regular in rate and rhythm. GI: The abdomen is early post gravid, diastases recti noted, large protruding umbilical hernia with fascial defect of 3 cm in size estimated, otherwise soft, nontender, and nondistended. There were no palpable masses. I did not appreciate hepatosplenomegaly. There were normal bowel sounds. MUSCULOSKELETAL:  Symmetrical muscle tone appreciated in all  four extremities.    SKIN: Skin turgor is normal. No pathologic skin lesions appreciated.  NEUROLOGIC:  Motor and sensation appear grossly normal.  Cranial nerves are grossly without defect. PSYCH:  Alert and oriented to person, place and time. Affect is appropriate for situation.  Data Reviewed I have personally reviewed what is currently available of the patient's imaging, recent labs and medical records.   Labs:  CBC Latest Ref Rng & Units  02/20/2021 02/19/2021 02/17/2021  WBC 4.0 - 10.5 K/uL 9.2 11.5(H) 10.6(H)  Hemoglobin 12.0 - 15.0 g/dL 10.6(L) 11.0(L) 12.9  Hematocrit 36.0 - 46.0 % 32.7(L) 33.4(L) 37.7  Platelets 150 - 400 K/uL 229 228 276   CMP Latest Ref Rng & Units 02/20/2021 02/19/2021 02/17/2021  Glucose 70 - 99 mg/dL 145(H) 80 -  BUN 6 - 20 mg/dL 5(L) - -  Creatinine 0.44 - 1.00 mg/dL 0.65 - 0.61  Sodium 135 - 145 mmol/L 139 - -  Potassium 3.5 - 5.1 mmol/L 3.5 - -  Chloride 98 - 111 mmol/L 104 - -  CO2 22 - 32 mmol/L 25 - -  Calcium 8.9 - 10.3 mg/dL 8.6(L) - -  Total Protein 6.5 - 8.1 g/dL 5.1(L) - -  Total Bilirubin 0.3 - 1.2 mg/dL 0.8 - -  Alkaline Phos 38 - 126 U/L 84 - -  AST 15 - 41 U/L 21 - -  ALT 0 - 44 U/L 14 - -      Imaging:  Within last 24 hrs: No results found.  Assessment    Umbilical hernia, diastases recti. Patient Active Problem List   Diagnosis Date Noted  . Status post repeat low transverse cesarean section 02/18/2021  . Gestational diabetes mellitus (GDM) affecting pregnancy, antepartum 12/04/2020  . Placenta previa antepartum in second trimester 10/11/2020  . History of cesarean delivery 08/07/2020  . Back pain affecting pregnancy in first trimester 08/07/2020  . Supervision of other normal pregnancy, antepartum 07/31/2020  . Symptomatic mammary hypertrophy 09/20/2018  . Chronic bilateral thoracic back pain 09/20/2018  . Neck pain 09/20/2018  . failed early 1h (154) 05/25/2016  . BMI 33.0-33.9,adult 05/20/2016  . Obesity in pregnancy 05/20/2016  . History of pre-eclampsia 05/20/2016  . History of shoulder dystocia in prior pregnancy 05/20/2016  . Umbilical hernia 05/20/2016    Plan    Robotic assisted umbilical hernia repair with mesh. I discussed possibility of incarceration, strangulation, enlargement in size over time, and the need for emergency surgery in the face of these.  Also reviewed the techniques of reduction should incarceration occur, and when unsuccessful to  present to the ED.  Also discussed that surgery risks include recurrence which can be up to 30% in the case of complex hernias, use of prosthetic materials (mesh) and the increased risk of infection and the possible need for re-operation and removal of mesh, possibility of post-op SBO or ileus, and the risks of general anesthetic including heart attack, stroke, sudden death or some reaction to anesthetic medications. The patient, and those present, appear to understand the risks, any and all questions were answered to the patient's satisfaction.  No guarantees were ever expressed or implied.  Face-to-face time spent with the patient and accompanying care providers(if present) was 30 minutes, with more than 50% of the time spent counseling, educating, and coordinating care of the patient.    These notes generated with voice recognition software. I apologize for typographical errors.  Rashi Granier M.D., FACS 04/15/2021, 10:33 AM     

## 2021-04-15 NOTE — Telephone Encounter (Signed)
Patient has been advised of Pre-Admission date/time, COVID Testing date and Surgery date.  Surgery Date: 04/23/21 Preadmission Testing Date: 04/18/21 (phone 8a-1p) Covid Testing Date: Not needed.    Patient has been made aware to call [phone], between 1-3:00pm the day before surgery, to find out what time to arrive for surgery.

## 2021-04-15 NOTE — H&P (View-Only) (Signed)
Patient ID: Marcia Bryant, female   DOB: September 08, 1986, 35 y.o.   MRN: [id number]  Chief Complaint: Umbilical hernia  History of Present Illness Marcia Bryant is a 35 y.o. female with an umbilical hernia that she has known since infancy.  It began to bother her with her pregnancies, that has increased in size and pain with her last pregnancy which was delivered 2 months ago.  She reports a significant protrusion readily apparent, some discomfort with lifting etc.  No history of obstruction, bowel habit changes or prior surgery.  Past Medical History Past Medical History:  Diagnosis Date  . Bacterial vaginosis   . Gestational diabetes   . Preeclampsia 01/25/2013  . Umbilical hernia 05/20/2016  . Yeast infection       Past Surgical History:  Procedure Laterality Date  . CESAREAN SECTION N/A 12/13/2016   Procedure: CESAREAN SECTION;  Surgeon: Adam Phenix, MD;  Location: [facility] BIRTHING SUITES;  Service: Obstetrics;  Laterality: N/A;  . CESAREAN SECTION WITH BILATERAL TUBAL LIGATION N/A 02/18/2021   Procedure: CESAREAN SECTION WITH BILATERAL TUBAL LIGATION;  Surgeon: Levie Heritage, DO;  Location: [facility] LD ORS;  Service: Obstetrics;  Laterality: N/A;    No Known Allergies  Current Outpatient Medications  Medication Sig Dispense Refill  . ibuprofen (ADVIL) 800 MG tablet Take 1 tablet (800 mg total) by mouth every 8 (eight) hours as needed. 30 tablet 0  . Prenatal Vit-Fe Fumarate-FA (PRENATAL MULTIVITAMIN) TABS tablet Take 1 tablet by mouth daily.     No current facility-administered medications for this visit.    Family History Family History  Problem Relation Age of Onset  . Hypertension Father   . Hypertension Paternal Grandmother   . Diabetes Mother       Social History Social History   Tobacco Use  . Smoking status: Never Smoker  . Smokeless tobacco: Never Used  Vaping Use  . Vaping Use: Never used  Substance Use Topics  . Alcohol use: No  . Drug use: No         Review of Systems  Constitutional: Negative.   HENT: Negative.   Eyes: Negative.   Respiratory: Negative.   Cardiovascular: Negative.   Gastrointestinal: Negative.   Genitourinary: Negative.   Skin: Negative.   Neurological: Negative.   Psychiatric/Behavioral: Negative.       Physical Exam Blood pressure 139/85, pulse 66, temperature 98.3 F (36.8 C), height 5\' 6"  (1.676 m), weight 198 lb 6.4 oz (90 kg), last menstrual period 04/07/2021, SpO2 98 %, not currently breastfeeding. Last Weight  Most recent update: 04/15/2021  9:31 AM   Weight  90 kg (198 lb 6.4 oz)            CONSTITUTIONAL: Well developed, and nourished, appropriately responsive and aware without distress.   EYES: Sclera non-icteric.   EARS, NOSE, MOUTH AND THROAT: Mask worn.   Hearing is intact to voice.  NECK: Trachea is midline, and there is no jugular venous distension.  LYMPH NODES:  Lymph nodes in the neck are not enlarged. RESPIRATORY:  Lungs are clear, and breath sounds are equal bilaterally. Normal respiratory effort without pathologic use of accessory muscles. CARDIOVASCULAR: Heart is regular in rate and rhythm. GI: The abdomen is early post gravid, diastases recti noted, large protruding umbilical hernia with fascial defect of 3 cm in size estimated, otherwise soft, nontender, and nondistended. There were no palpable masses. I did not appreciate hepatosplenomegaly. There were normal bowel sounds. MUSCULOSKELETAL:  Symmetrical muscle tone appreciated in all  four extremities.    SKIN: Skin turgor is normal. No pathologic skin lesions appreciated.  NEUROLOGIC:  Motor and sensation appear grossly normal.  Cranial nerves are grossly without defect. PSYCH:  Alert and oriented to person, place and time. Affect is appropriate for situation.  Data Reviewed I have personally reviewed what is currently available of the patient's imaging, recent labs and medical records.   Labs:  CBC Latest Ref Rng & Units  02/20/2021 02/19/2021 02/17/2021  WBC 4.0 - 10.5 K/uL 9.2 11.5(H) 10.6(H)  Hemoglobin 12.0 - 15.0 g/dL 10.6(L) 11.0(L) 12.9  Hematocrit 36.0 - 46.0 % 32.7(L) 33.4(L) 37.7  Platelets 150 - 400 K/uL 229 228 276   CMP Latest Ref Rng & Units 02/20/2021 02/19/2021 02/17/2021  Glucose 70 - 99 mg/dL 950(D) 80 -  BUN 6 - 20 mg/dL 5(L) - -  Creatinine 3.26 - 1.00 mg/dL 7.12 - 4.58  Sodium 099 - 145 mmol/L 139 - -  Potassium 3.5 - 5.1 mmol/L 3.5 - -  Chloride 98 - 111 mmol/L 104 - -  CO2 22 - 32 mmol/L 25 - -  Calcium 8.9 - 10.3 mg/dL 8.3(J) - -  Total Protein 6.5 - 8.1 g/dL 5.1(L) - -  Total Bilirubin 0.3 - 1.2 mg/dL 0.8 - -  Alkaline Phos 38 - 126 U/L 84 - -  AST 15 - 41 U/L 21 - -  ALT 0 - 44 U/L 14 - -      Imaging:  Within last 24 hrs: No results found.  Assessment    Umbilical hernia, diastases recti. Patient Active Problem List   Diagnosis Date Noted  . Status post repeat low transverse cesarean section 02/18/2021  . Gestational diabetes mellitus (GDM) affecting pregnancy, antepartum 12/04/2020  . Placenta previa antepartum in second trimester 10/11/2020  . History of cesarean delivery 08/07/2020  . Back pain affecting pregnancy in first trimester 08/07/2020  . Supervision of other normal pregnancy, antepartum 07/31/2020  . Symptomatic mammary hypertrophy 09/20/2018  . Chronic bilateral thoracic back pain 09/20/2018  . Neck pain 09/20/2018  . failed early 1h (154) 05/25/2016  . BMI 33.0-33.9,adult 05/20/2016  . Obesity in pregnancy 05/20/2016  . History of pre-eclampsia 05/20/2016  . History of shoulder dystocia in prior pregnancy 05/20/2016  . Umbilical hernia 05/20/2016    Plan    Robotic assisted umbilical hernia repair with mesh. I discussed possibility of incarceration, strangulation, enlargement in size over time, and the need for emergency surgery in the face of these.  Also reviewed the techniques of reduction should incarceration occur, and when unsuccessful to  present to the ED.  Also discussed that surgery risks include recurrence which can be up to 30% in the case of complex hernias, use of prosthetic materials (mesh) and the increased risk of infection and the possible need for re-operation and removal of mesh, possibility of post-op SBO or ileus, and the risks of general anesthetic including heart attack, stroke, sudden death or some reaction to anesthetic medications. The patient, and those present, appear to understand the risks, any and all questions were answered to the patient's satisfaction.  No guarantees were ever expressed or implied.  Face-to-face time spent with the patient and accompanying care providers(if present) was 30 minutes, with more than 50% of the time spent counseling, educating, and coordinating care of the patient.    These notes generated with voice recognition software. I apologize for typographical errors.  Campbell Lerner M.D., FACS 04/15/2021, 10:33 AM

## 2021-04-18 ENCOUNTER — Encounter
Admission: RE | Admit: 2021-04-18 | Discharge: 2021-04-18 | Disposition: A | Discharge status: Home / Self Care | Payer: Medicaid Other | Source: Ambulatory Visit | Attending: Surgery | Admitting: Surgery

## 2021-04-18 ENCOUNTER — Other Ambulatory Visit: Payer: Self-pay

## 2021-04-18 NOTE — Patient Instructions (Signed)
Your procedure is scheduled on: 04/23/21 Report to DAY SURGERY DEPARTMENT LOCATED ON 2ND FLOOR MEDICAL MALL ENTRANCE. To find out your arrival time please call [phone] between 1PM - 3PM on 04/22/21.  Remember: Instructions that are not followed completely may result in serious medical risk, up to and including death, or upon the discretion of your surgeon and anesthesiologist your surgery may need to be rescheduled.     _X__ 1. Do not eat food or drink liquids after midnight the night before your procedure.                 No gum chewing or hard candies.   __X__2.  On the morning of surgery brush your teeth with toothpaste and water, you                 may rinse your mouth with mouthwash if you wish.  Do not swallow any              toothpaste of mouthwash.     _X__ 3.  No Alcohol for 24 hours before or after surgery.   _X__ 4.  Do Not Smoke or use e-cigarettes For 24 Hours Prior to Your Surgery.                 Do not use any chewable tobacco products for at least 6 hours prior to                 surgery.  ____  5.  Bring all medications with you on the day of surgery if instructed.   __X__  6.  Notify your doctor if there is any change in your medical condition      (cold, fever, infections).     Do not wear jewelry, make-up, hairpins, clips or nail polish. Do not wear lotions, powders, or perfumes.  Do not shave 48 hours prior to surgery. Men may shave face and neck. Do not bring valuables to the hospital.    [facility] is not responsible for any belongings or valuables.  Contacts, dentures/partials or body piercings may not be worn into surgery. Bring a case for your contacts, glasses or hearing aids, a denture cup will be supplied. Leave your suitcase in the car. After surgery it may be brought to your room. For patients admitted to the hospital, discharge time is determined by your treatment team.   Patients discharged the day of surgery will not be allowed to drive  home.   Please read over the following fact sheets that you were given:     __X__ Take these medicines the morning of surgery with A SIP OF WATER:    1. none  2.   3.   4.  5.  6.  ____ Fleet Enema (as directed)   ____ Use CHG Soap/SAGE wipes as directed  ____ Use inhalers on the day of surgery  ____ Stop metformin/Janumet/Farxiga 2 days prior to surgery    ____ Take 1/2 of usual insulin dose the night before surgery. No insulin the morning          of surgery.   ____ Stop Blood Thinners Coumadin/Plavix/Xarelto/Pleta/Pradaxa/Eliquis/Effient/Aspirin  on   Or contact your Surgeon, Cardiologist or Medical Doctor regarding  ability to stop your blood thinners  __X__ Stop Anti-inflammatories 7 days before surgery such as Advil, Ibuprofen, Motrin,  BC or Goodies Powder, Naprosyn, Naproxen, Aleve, Aspirin   You may use Tylenol if needed  __X__ Stop all herbal supplements, fish  oil or vitamin E until after surgery.    ____ Bring C-Pap to the hospital.

## 2021-04-23 ENCOUNTER — Ambulatory Visit: Payer: Medicaid Other | Admitting: Registered Nurse

## 2021-04-23 ENCOUNTER — Encounter: Payer: Self-pay | Admitting: Surgery

## 2021-04-23 ENCOUNTER — Ambulatory Visit
Admission: RE | Admit: 2021-04-23 | Discharge: 2021-04-23 | Disposition: A | Discharge status: Home / Self Care | Payer: Medicaid Other | Source: Ambulatory Visit | Attending: Surgery | Admitting: Surgery

## 2021-04-23 ENCOUNTER — Other Ambulatory Visit: Payer: Self-pay

## 2021-04-23 ENCOUNTER — Encounter
Admission: RE | Disposition: A | Discharge status: Home / Self Care | Payer: Self-pay | Source: Ambulatory Visit | Attending: Surgery

## 2021-04-23 DIAGNOSIS — K429 Umbilical hernia without obstruction or gangrene: Secondary | ICD-10-CM | POA: Diagnosis not present

## 2021-04-23 DIAGNOSIS — M6208 Separation of muscle (nontraumatic), other site: Secondary | ICD-10-CM

## 2021-04-23 LAB — POCT PREGNANCY, URINE: Preg Test, Ur: NEGATIVE

## 2021-04-23 SURGERY — REPAIR, HERNIA, UMBILICAL, ROBOT-ASSISTED
Anesthesia: General

## 2021-04-23 MED ORDER — DIPHENHYDRAMINE HCL 50 MG/ML IJ SOLN
INTRAMUSCULAR | Status: AC
  Filled 2021-04-23: qty 1

## 2021-04-23 MED ORDER — LIDOCAINE HCL (CARDIAC) PF 100 MG/5ML IV SOSY
PREFILLED_SYRINGE | INTRAVENOUS | Status: DC | PRN
  Administered 2021-04-23: 100 mg via INTRAVENOUS

## 2021-04-23 MED ORDER — CHLORHEXIDINE GLUCONATE 0.12 % MT SOLN
OROMUCOSAL | Status: AC
  Administered 2021-04-23: 15 mL via OROMUCOSAL
  Filled 2021-04-23: qty 15

## 2021-04-23 MED ORDER — FAMOTIDINE 20 MG PO TABS: 20.0000 mg | ORAL_TABLET | Freq: Once | ORAL | Status: AC

## 2021-04-23 MED ORDER — EPHEDRINE 5 MG/ML INJ
INTRAVENOUS | Status: AC
  Filled 2021-04-23: qty 10

## 2021-04-23 MED ORDER — BUPIVACAINE LIPOSOME 1.3 % IJ SUSP: 20.0000 mL | Freq: Once | INTRAMUSCULAR | Status: DC

## 2021-04-23 MED ORDER — IBUPROFEN 800 MG PO TABS
ORAL_TABLET | ORAL | Status: AC
  Filled 2021-04-23: qty 1

## 2021-04-23 MED ORDER — POLYETHYLENE GLYCOL 3350 17 G PO PACK
17.0000 g | PACK | Freq: Every day | ORAL | 0 refills | Status: DC
Start: 2021-04-23 — End: 2021-07-07

## 2021-04-23 MED ORDER — FENTANYL CITRATE (PF) 100 MCG/2ML IJ SOLN
25.0000 ug | INTRAMUSCULAR | Status: DC | PRN
  Administered 2021-04-23: 25 ug via INTRAVENOUS

## 2021-04-23 MED ORDER — IBUPROFEN 800 MG PO TABS
800.0000 mg | ORAL_TABLET | Freq: Once | ORAL | Status: AC
  Administered 2021-04-23: 800 mg via ORAL
  Filled 2021-04-23: qty 1

## 2021-04-23 MED ORDER — ROCURONIUM BROMIDE 10 MG/ML (PF) SYRINGE
PREFILLED_SYRINGE | INTRAVENOUS | Status: AC
  Filled 2021-04-23: qty 10

## 2021-04-23 MED ORDER — SUGAMMADEX SODIUM 200 MG/2ML IV SOLN
INTRAVENOUS | Status: DC | PRN
  Administered 2021-04-23: 500 mg via INTRAVENOUS

## 2021-04-23 MED ORDER — HYDROCODONE-ACETAMINOPHEN 5-325 MG PO TABS
1.0000 | ORAL_TABLET | Freq: Four times a day (QID) | ORAL | Status: DC | PRN
  Administered 2021-04-23: 1 via ORAL

## 2021-04-23 MED ORDER — FENTANYL CITRATE (PF) 100 MCG/2ML IJ SOLN
INTRAMUSCULAR | Status: DC | PRN
  Administered 2021-04-23: 25 ug via INTRAVENOUS
  Administered 2021-04-23: 50 ug via INTRAVENOUS

## 2021-04-23 MED ORDER — FAMOTIDINE 20 MG PO TABS
ORAL_TABLET | ORAL | Status: AC
  Administered 2021-04-23: 20 mg via ORAL
  Filled 2021-04-23: qty 1

## 2021-04-23 MED ORDER — ONDANSETRON HCL 4 MG/2ML IJ SOLN: 4.0000 mg | Freq: Once | INTRAMUSCULAR | Status: DC | PRN

## 2021-04-23 MED ORDER — DEXMEDETOMIDINE HCL 200 MCG/2ML IV SOLN
INTRAVENOUS | Status: DC | PRN
  Administered 2021-04-23: 4 ug via INTRAVENOUS
  Administered 2021-04-23 (×2): 8 ug via INTRAVENOUS
  Administered 2021-04-23: 12 ug via INTRAVENOUS

## 2021-04-23 MED ORDER — GABAPENTIN 300 MG PO CAPS
ORAL_CAPSULE | ORAL | Status: AC
  Administered 2021-04-23: 300 mg via ORAL
  Filled 2021-04-23: qty 1

## 2021-04-23 MED ORDER — DEXMEDETOMIDINE (PRECEDEX) IN NS 20 MCG/5ML (4 MCG/ML) IV SYRINGE
PREFILLED_SYRINGE | INTRAVENOUS | Status: AC
  Filled 2021-04-23: qty 5

## 2021-04-23 MED ORDER — HYDROCODONE-ACETAMINOPHEN 5-325 MG PO TABS: 1.0000 | ORAL_TABLET | Freq: Four times a day (QID) | ORAL | 0 refills | Status: DC | PRN

## 2021-04-23 MED ORDER — ROCURONIUM BROMIDE 100 MG/10ML IV SOLN
INTRAVENOUS | Status: DC | PRN
  Administered 2021-04-23: 60 mg via INTRAVENOUS
  Administered 2021-04-23: 20 mg via INTRAVENOUS

## 2021-04-23 MED ORDER — DIPHENHYDRAMINE HCL 50 MG/ML IJ SOLN
INTRAMUSCULAR | Status: DC | PRN
  Administered 2021-04-23: 25 mg via INTRAVENOUS

## 2021-04-23 MED ORDER — FENTANYL CITRATE (PF) 100 MCG/2ML IJ SOLN
INTRAMUSCULAR | Status: AC
  Administered 2021-04-23: 25 ug via INTRAVENOUS
  Filled 2021-04-23: qty 2

## 2021-04-23 MED ORDER — GABAPENTIN 300 MG PO CAPS
300.0000 mg | ORAL_CAPSULE | ORAL | Status: AC
Start: 2021-04-23 — End: 2021-04-23

## 2021-04-23 MED ORDER — LACTATED RINGERS IV SOLN: INTRAVENOUS | Status: DC

## 2021-04-23 MED ORDER — BUPIVACAINE-EPINEPHRINE 0.25% -1:200000 IJ SOLN
INTRAMUSCULAR | Status: DC | PRN
  Administered 2021-04-23: 30 mL

## 2021-04-23 MED ORDER — PROPOFOL 10 MG/ML IV BOLUS
INTRAVENOUS | Status: DC | PRN
  Administered 2021-04-23: 200 mg via INTRAVENOUS

## 2021-04-23 MED ORDER — CELECOXIB 200 MG PO CAPS
200.0000 mg | ORAL_CAPSULE | ORAL | Status: AC
Start: 2021-04-23 — End: 2021-04-23

## 2021-04-23 MED ORDER — BUPIVACAINE-EPINEPHRINE (PF) 0.25% -1:200000 IJ SOLN
INTRAMUSCULAR | Status: AC
  Filled 2021-04-23: qty 30

## 2021-04-23 MED ORDER — FENTANYL CITRATE (PF) 100 MCG/2ML IJ SOLN
INTRAMUSCULAR | Status: AC
  Filled 2021-04-23: qty 2

## 2021-04-23 MED ORDER — KETAMINE HCL 50 MG/ML IJ SOLN
INTRAMUSCULAR | Status: AC
  Filled 2021-04-23: qty 1

## 2021-04-23 MED ORDER — KETAMINE HCL 10 MG/ML IJ SOLN
INTRAMUSCULAR | Status: DC | PRN
  Administered 2021-04-23: 15 mg via INTRAVENOUS
  Administered 2021-04-23: 10 mg via INTRAVENOUS
  Administered 2021-04-23: 25 mg via INTRAVENOUS

## 2021-04-23 MED ORDER — DEXAMETHASONE SODIUM PHOSPHATE 10 MG/ML IJ SOLN
INTRAMUSCULAR | Status: AC
  Filled 2021-04-23: qty 1

## 2021-04-23 MED ORDER — CHLORHEXIDINE GLUCONATE CLOTH 2 % EX PADS
6.0000 | MEDICATED_PAD | Freq: Once | CUTANEOUS | Status: AC
Start: 2021-04-23 — End: 2021-04-23
  Administered 2021-04-23: 6 via TOPICAL

## 2021-04-23 MED ORDER — HYDROCODONE-ACETAMINOPHEN 5-325 MG PO TABS
ORAL_TABLET | ORAL | Status: AC
  Filled 2021-04-23: qty 1

## 2021-04-23 MED ORDER — CHLORHEXIDINE GLUCONATE 0.12 % MT SOLN: 15.0000 mL | Freq: Once | OROMUCOSAL | Status: AC

## 2021-04-23 MED ORDER — IBUPROFEN 800 MG PO TABS: 800.0000 mg | ORAL_TABLET | Freq: Three times a day (TID) | ORAL | 0 refills | Status: DC | PRN

## 2021-04-23 MED ORDER — CHLORHEXIDINE GLUCONATE CLOTH 2 % EX PADS: 6.0000 | MEDICATED_PAD | Freq: Once | CUTANEOUS | Status: DC

## 2021-04-23 MED ORDER — MIDAZOLAM HCL 2 MG/2ML IJ SOLN
INTRAMUSCULAR | Status: DC | PRN
  Administered 2021-04-23: 2 mg via INTRAVENOUS

## 2021-04-23 MED ORDER — DEXAMETHASONE SODIUM PHOSPHATE 10 MG/ML IJ SOLN
INTRAMUSCULAR | Status: DC | PRN
  Administered 2021-04-23: 4 mg via INTRAVENOUS

## 2021-04-23 MED ORDER — CEFAZOLIN SODIUM-DEXTROSE 2-4 GM/100ML-% IV SOLN
INTRAVENOUS | Status: AC
  Filled 2021-04-23: qty 100

## 2021-04-23 MED ORDER — ONDANSETRON HCL 4 MG/2ML IJ SOLN
INTRAMUSCULAR | Status: DC | PRN
  Administered 2021-04-23: 4 mg via INTRAVENOUS

## 2021-04-23 MED ORDER — ORAL CARE MOUTH RINSE: 15.0000 mL | Freq: Once | OROMUCOSAL | Status: AC

## 2021-04-23 MED ORDER — ONDANSETRON HCL 4 MG/2ML IJ SOLN
INTRAMUSCULAR | Status: AC
  Filled 2021-04-23: qty 2

## 2021-04-23 MED ORDER — CELECOXIB 200 MG PO CAPS
ORAL_CAPSULE | ORAL | Status: AC
  Administered 2021-04-23: 200 mg via ORAL
  Filled 2021-04-23: qty 1

## 2021-04-23 MED ORDER — ACETAMINOPHEN 500 MG PO TABS: 1000.0000 mg | ORAL_TABLET | ORAL | Status: AC

## 2021-04-23 MED ORDER — ACETAMINOPHEN 500 MG PO TABS
ORAL_TABLET | ORAL | Status: AC
  Administered 2021-04-23: 1000 mg via ORAL
  Filled 2021-04-23: qty 2

## 2021-04-23 MED ORDER — CEFAZOLIN SODIUM-DEXTROSE 2-4 GM/100ML-% IV SOLN
2.0000 g | INTRAVENOUS | Status: AC
  Administered 2021-04-23: 2 g via INTRAVENOUS

## 2021-04-23 MED ORDER — MIDAZOLAM HCL 2 MG/2ML IJ SOLN
INTRAMUSCULAR | Status: AC
  Filled 2021-04-23: qty 2

## 2021-04-23 SURGICAL SUPPLY — 48 items
ADH SKN CLS APL DERMABOND .7 (GAUZE/BANDAGES/DRESSINGS) ×1
APL PRP STRL LF DISP 70% ISPRP (MISCELLANEOUS) ×1
BINDER ABDOMINAL 12 ML 46-62 (SOFTGOODS) ×2 IMPLANT
CANISTER SUCT 1200ML W/VALVE (MISCELLANEOUS) ×2 IMPLANT
CANNULA CAP OBTURATR AIRSEAL 8 (CAP) ×2 IMPLANT
CHLORAPREP W/TINT 26 (MISCELLANEOUS) ×2 IMPLANT
COVER TIP SHEARS 8 DVNC (MISCELLANEOUS) ×1 IMPLANT
COVER TIP SHEARS 8MM DA VINCI (MISCELLANEOUS) ×1
COVER WAND RF STERILE (DRAPES) ×2 IMPLANT
DECANTER SPIKE VIAL GLASS SM (MISCELLANEOUS) ×2 IMPLANT
DEFOGGER SCOPE WARMER CLEARIFY (MISCELLANEOUS) ×2 IMPLANT
DERMABOND ADVANCED (GAUZE/BANDAGES/DRESSINGS) ×1
DERMABOND ADVANCED .7 DNX12 (GAUZE/BANDAGES/DRESSINGS) ×1 IMPLANT
DRAPE ARM DVNC X/XI (DISPOSABLE) ×4 IMPLANT
DRAPE COLUMN DVNC XI (DISPOSABLE) ×1 IMPLANT
DRAPE DA VINCI XI ARM (DISPOSABLE) ×4
DRAPE DA VINCI XI COLUMN (DISPOSABLE) ×1
ELECT REM PT RETURN 9FT ADLT (ELECTROSURGICAL) ×2
ELECTRODE REM PT RTRN 9FT ADLT (ELECTROSURGICAL) ×1 IMPLANT
GLOVE SURG ORTHO LTX SZ7.5 (GLOVE) ×12 IMPLANT
GOWN STRL REUS W/ TWL LRG LVL3 (GOWN DISPOSABLE) ×4 IMPLANT
GOWN STRL REUS W/TWL LRG LVL3 (GOWN DISPOSABLE) ×8
GRASPER SUT TROCAR 14GX15 (MISCELLANEOUS) IMPLANT
IRRIGATION STRYKERFLOW (MISCELLANEOUS) IMPLANT
IRRIGATOR STRYKERFLOW (MISCELLANEOUS)
IV NS 1000ML (IV SOLUTION)
IV NS 1000ML BAXH (IV SOLUTION) IMPLANT
KIT PINK PAD W/HEAD ARE REST (MISCELLANEOUS) ×2
KIT PINK PAD W/HEAD ARM REST (MISCELLANEOUS) ×1 IMPLANT
KIT TURNOVER KIT A (KITS) ×2 IMPLANT
MANIFOLD NEPTUNE II (INSTRUMENTS) ×2 IMPLANT
MESH VENTRALIGHT ST 6IN CRC (Mesh General) ×2 IMPLANT
NEEDLE HYPO 22GX1.5 SAFETY (NEEDLE) ×2 IMPLANT
NEEDLE INSUFFLATION 14GA 120MM (NEEDLE) ×2 IMPLANT
NS IRRIG 500ML POUR BTL (IV SOLUTION) ×2 IMPLANT
PACK LAP CHOLECYSTECTOMY (MISCELLANEOUS) ×2 IMPLANT
SCISSORS METZENBAUM CVD 33 (INSTRUMENTS) IMPLANT
SEAL CANN UNIV 5-8 DVNC XI (MISCELLANEOUS) ×2 IMPLANT
SEAL XI 5MM-8MM UNIVERSAL (MISCELLANEOUS) ×2
SET TUBE FILTERED XL AIRSEAL (SET/KITS/TRAYS/PACK) ×2 IMPLANT
SOLUTION ELECTROLUBE (MISCELLANEOUS) ×2 IMPLANT
SUT MNCRL 4-0 (SUTURE) ×2
SUT MNCRL 4-0 27XMFL (SUTURE) ×1
SUT STRATAFIX 0 PDS+ CT-2 23 (SUTURE) ×4
SUT VICRYL 0 AB UR-6 (SUTURE) ×2 IMPLANT
SUT VLOC 90 2/L VL 12 GS22 (SUTURE) ×4 IMPLANT
SUTURE MNCRL 4-0 27XMF (SUTURE) ×1 IMPLANT
SUTURE STRATFX 0 PDS+ CT-2 23 (SUTURE) ×2 IMPLANT

## 2021-04-23 NOTE — Op Note (Signed)
Robotic assisted laparoscopic umbilical hernia Repair IPOM using  round ventralight BARD mesh   Pre-operative Diagnosis: Umbilical hernia, diastases recti   Post-operative Diagnosis: same   Surgeon:  Campbell Lerner, M.D., FACS   Anesthesia: Gen. with endotracheal tube   Findings: 6 cm diameter umbilical fascial defect.  Primary repair, and reinforcement with 15 cm diameter mesh.  Estimated Blood Loss: cc   Complications: none           Procedure Details  The patient was seen again in the Holding Room. The benefits, complications, treatment options, and expected outcomes were discussed with the patient. The risks of bleeding, infection, recurrence of symptoms, failure to resolve symptoms, bowel injury, mesh placement, mesh infection, any of which could require further surgery were reviewed with the patient. The likelihood of improving the patient's symptoms with return to their baseline status is good.  The patient and/or family concurred with the proposed plan, giving informed consent.  The patient was taken to Operating Room, identified and the procedure verified.  A Time Out was held and the above information confirmed.   Prior to the induction of general anesthesia, antibiotic prophylaxis was administered. VTE prophylaxis was in place. General endotracheal anesthesia was then administered and tolerated well. After the induction, the abdomen was prepped with Chloraprep and draped in the sterile fashion. The patient was positioned in the supine position. After local infiltration of quarter percent Marcaine with epinephrine, stab incision was made left upper quadrant.  Just below the costal margin approximately midclavicular line the Veress needle is passed with sensation of the layers to penetrate the abdominal wall and into the peritoneum.  Saline drop test is confirmed peritoneal placement.  Insufflation is initiated with carbon dioxide to pressures of 15 mmHg. Marcaine quarter percent with  Exparel was used to inject all the incision sites. We used a left sided mid incision and using a robotic 8.5 mm trocar, it was inserted and pneumoperitoneum maintained utilizing the air seal.  Initial pressures utilized for 15 mmHg, we were able to successfully reduce them to 5 mmHg throughout the remainder of the procedure.  No hemodynamic compromise, no evidence of any intra-abdominal injury or adhesions.   2 additional 8.5 mm ports were placed under direct visualization on the left lateral abdomen.  I visualized the hernia and there was an umbilical hernia measuring approximately 6 cm.    The robot was brought to the surgical field and docked in the standard fashion.  We made sure that all instrumentation was kept under direct vision at all times and there was no collision between the arms.  I scrubbed out and went to the console. Falciform and adjacent preperitoneal adipose and umbilical ligaments and a portion of the hernia sac were taken down with blunt dissection, scissors and electrocautery to allow adequate mesh placement to be secured to the fascial tissue. Confirm and measured that the defect was 6 cm.  .  Using two 0-strata fix sutures we closed the ventral defect primarily, reapproximating the defect along the axis of the midline.   I used the same suture to secure the mesh and centered it over the fascial closure.  The mesh was secured circumferentially to the abdominal wall using two 2-0 V-lock sutures in the standard fashion.   The mesh appeared well secured against the  abdominal wall. A second look laparoscopy revealed no evidence of intra-abdominal injury.    All the needles were removed under direct visualization.  The instruments were removed and the robot  was undocked.  The laparoscopic ports were removed under direct visualization and the pneumoperitoneum was deflated. Incisions were closed with  4-0 Monocryl   Dermabond was used to coat the skin.  Patient tolerated procedure well  and there were no immediate complications. Needle and laparotomy counts were correct Abdominal binder applied prior to extubation.  Campbell Lerner M.D., [facility] 04/23/2021 10:59 AM

## 2021-04-23 NOTE — Interval H&P Note (Signed)
History and Physical Interval Note:  04/23/2021 8:18 AM  Marcia Bryant  has presented today for surgery, with the diagnosis of umbilical hernia.  The various methods of treatment have been discussed with the patient and family. After consideration of risks, benefits and other options for treatment, the patient has consented to  Procedure(s): XI ROBOT ASSISTED UMBILICAL HERNIA REPAIR (N/A) as a surgical intervention.  The patient's history has been reviewed, patient examined, no change in status, stable for surgery.  I have reviewed the patient's chart and labs.  Questions were answered to the patient's satisfaction.     Campbell Lerner

## 2021-04-23 NOTE — Anesthesia Procedure Notes (Signed)
Procedure Name: Intubation Date/Time: 04/23/2021 8:45 AM Performed by: Lynden Oxford, CRNA Pre-anesthesia Checklist: Patient identified, Emergency Drugs available, Suction available and Patient being monitored Patient Re-evaluated:Patient Re-evaluated prior to induction Oxygen Delivery Method: Circle system utilized Preoxygenation: Pre-oxygenation with 100% oxygen Induction Type: IV induction Ventilation: Mask ventilation without difficulty Laryngoscope Size: McGraph and 3 Grade View: Grade I Tube type: Oral Tube size: 7.0 mm Number of attempts: 1 Airway Equipment and Method: Stylet and Video-laryngoscopy Placement Confirmation: ETT inserted through vocal cords under direct vision, positive ETCO2 and breath sounds checked- equal and bilateral Secured at: 21 cm Tube secured with: Tape Dental Injury: Teeth and Oropharynx as per pre-operative assessment  Comments: Hypertrophic papillae noted at base of tongue, no abnormalities noted near epiglottis or glottic opening

## 2021-04-23 NOTE — Transfer of Care (Signed)
Immediate Anesthesia Transfer of Care Note  Patient: Marcia Bryant  Procedure(s) Performed: XI ROBOT ASSISTED UMBILICAL HERNIA REPAIR  Patient Location: PACU  Anesthesia Type:General  Level of Consciousness: drowsy  Airway & Oxygen Therapy: Patient Spontanous Breathing and Patient connected to face mask oxygen  Post-op Assessment: Report given to RN and Post -op Vital signs reviewed and stable  Post vital signs: Reviewed and stable  Last Vitals:  Vitals Value Taken Time  BP 144/97 04/23/21 1105  Temp    Pulse 80 04/23/21 1107  Resp 17 04/23/21 1107  SpO2 100 % 04/23/21 1107  Vitals shown include unvalidated device data.  Last Pain:  Vitals:   04/23/21 0717  TempSrc: Oral  PainSc: 0-No pain         Complications: No notable events documented.

## 2021-04-23 NOTE — Progress Notes (Signed)
Patient comes from PACU. Somewhat difficult to arouse. Patient moved to chair. States pain is a 8/10. Patient resting with eyes closed. NAD. Patient now snoring. NAD.

## 2021-04-23 NOTE — Anesthesia Preprocedure Evaluation (Signed)
Anesthesia Evaluation  Patient identified by MRN, date of birth, ID band Patient awake    Reviewed: Allergy & Precautions, NPO status , Patient's Chart, lab work & pertinent test results  Airway Mallampati: II  TM Distance: >3 FB Neck ROM: Full    Dental  (+) Teeth Intact, Dental Advisory Given   Pulmonary neg pulmonary ROS,    Pulmonary exam normal breath sounds clear to auscultation       Cardiovascular hypertension, negative cardio ROS Normal cardiovascular exam Rhythm:Regular Rate:Normal     Neuro/Psych negative neurological ROS  negative psych ROS   GI/Hepatic negative GI ROS, Neg liver ROS,   Endo/Other  diabetes, Well Controlled, GestationalObesity   Renal/GU negative Renal ROS     Musculoskeletal negative musculoskeletal ROS (+)   Abdominal   Peds  Hematology negative hematology ROS (+) Plt 222k   Anesthesia Other Findings Past Medical History: No date: Bacterial vaginosis No date: Gestational diabetes 01/25/2013: Preeclampsia 05/20/2016: Umbilical hernia No date: Yeast infection  Reproductive/Obstetrics negative OB ROS Pre-eclampsia with prior pregnancy                              Anesthesia Physical  Anesthesia Plan  ASA: 2  Anesthesia Plan: General   Post-op Pain Management:    Induction: Intravenous  PONV Risk Score and Plan:   Airway Management Planned: Oral ETT  Additional Equipment:   Intra-op Plan:   Post-operative Plan: Extubation in OR  Informed Consent: I have reviewed the patients History and Physical, chart, labs and discussed the procedure including the risks, benefits and alternatives for the proposed anesthesia with the patient or authorized representative who has indicated his/her understanding and acceptance.     Dental advisory given  Plan Discussed with:   Anesthesia Plan Comments: (  )        Anesthesia Quick  Evaluation

## 2021-04-23 NOTE — Anesthesia Postprocedure Evaluation (Signed)
Anesthesia Post Note  Patient: Marcia Bryant  Procedure(s) Performed: XI ROBOT ASSISTED UMBILICAL HERNIA REPAIR  Patient location during evaluation: PACU Anesthesia Type: General Level of consciousness: awake and alert and oriented Pain management: pain level controlled Vital Signs Assessment: post-procedure vital signs reviewed and stable Respiratory status: spontaneous breathing Cardiovascular status: blood pressure returned to baseline Anesthetic complications: no   No notable events documented.   Last Vitals:  Vitals:   04/23/21 0717 04/23/21 1103  BP: 128/90 (!) 144/97  Pulse: 65 84  Resp: 18 (!) 24  Temp: 36.6 C   SpO2: 100% 98%    Last Pain:  Vitals:   04/23/21 0717  TempSrc: Oral  PainSc: 0-No pain                 Maryruth Apple

## 2021-04-23 NOTE — Discharge Instructions (Signed)
AMBULATORY SURGERY  ?DISCHARGE INSTRUCTIONS ? ? ?The drugs that you were given will stay in your system until tomorrow so for the next 24 hours you should not: ? ?Drive an automobile ?Make any legal decisions ?Drink any alcoholic beverage ? ? ?You may resume regular meals tomorrow.  Today it is better to start with liquids and gradually work up to solid foods. ? ?You may eat anything you prefer, but it is better to start with liquids, then soup and crackers, and gradually work up to solid foods. ? ? ?Please notify your doctor immediately if you have any unusual bleeding, trouble breathing, redness and pain at the surgery site, drainage, fever, or pain not relieved by medication. ? ? ? ?Additional Instructions: ? ? ? ?Please contact your physician with any problems or Same Day Surgery at [phone], Monday through Friday 6 am to 4 pm, or [place] [facility] [place] [facility] number at [phone].  ?

## 2021-04-25 ENCOUNTER — Telehealth: Payer: Self-pay | Admitting: *Deleted

## 2021-04-25 NOTE — Telephone Encounter (Signed)
Faxed FMLA to [employer] at [phone]  

## 2021-05-01 ENCOUNTER — Encounter: Payer: Self-pay | Admitting: Surgery

## 2021-05-01 ENCOUNTER — Other Ambulatory Visit: Payer: Self-pay

## 2021-05-01 ENCOUNTER — Ambulatory Visit (INDEPENDENT_AMBULATORY_CARE_PROVIDER_SITE_OTHER): Payer: Medicaid Other | Admitting: Surgery

## 2021-05-01 DIAGNOSIS — Z8719 Personal history of other diseases of the digestive system: Secondary | ICD-10-CM | POA: Insufficient documentation

## 2021-05-01 DIAGNOSIS — Z9889 Other specified postprocedural states: Secondary | ICD-10-CM

## 2021-05-01 NOTE — Progress Notes (Signed)
[facility] POST-OP OFFICE VISIT  05/01/2021  HPI: Marcia Bryant is a 35 y.o. female 8 days s/p robotic umbilical hernia repair.  Utilized a 15 cm diameter circle of Ventralex patch and IPOM manner.  She does have diastases recti.  She notes some left-sided soreness and was not aware that is where her incisions were.  She otherwise appears to be doing well she would like to resume nursing her child and is not taking any pain medications at present.  She has no fevers chills or or GI issues.  Vital signs: LMP 04/07/2021    Physical Exam: Constitutional: She appears quite well. Abdomen: Soft benign nontender. Skin: Incisions are clean, dry and intact.  Assessment/Plan: This is a 35 y.o. female 8 days s/p robotic assisted umbilical hernia repair with mesh.  Patient Active Problem List   Diagnosis Date Noted   Diastasis recti 04/15/2021   Status post repeat low transverse cesarean section 02/18/2021   Gestational diabetes mellitus (GDM) affecting pregnancy, antepartum 12/04/2020   Placenta previa antepartum in second trimester 10/11/2020   History of cesarean delivery 08/07/2020   Back pain affecting pregnancy in first trimester 08/07/2020   Supervision of other normal pregnancy, antepartum 07/31/2020   Symptomatic mammary hypertrophy 09/20/2018   Chronic bilateral thoracic back pain 09/20/2018   Neck pain 09/20/2018   failed early 1h (154) 05/25/2016   BMI 33.0-33.9,adult 05/20/2016   Obesity in pregnancy 05/20/2016   History of pre-eclampsia 05/20/2016   History of shoulder dystocia in prior pregnancy 05/20/2016   Umbilical hernia 05/20/2016    -Advised that she continue to wear her binder, though she came without it today.  She continue to avoid any heavy lifting for the next month.  I have asked her to follow-up in 1 month or as needed.   Campbell Lerner M.D., FACS 05/01/2021, 9:48 AM

## 2021-05-01 NOTE — Patient Instructions (Signed)
GENERAL POST-OPERATIVE PATIENT INSTRUCTIONS   WOUND CARE INSTRUCTIONS:  Keep a dry clean dressing on the wound if there is drainage. The initial bandage may be removed after 24 hours.  Once the wound has quit draining you may leave it open to air.  If clothing rubs against the wound or causes irritation and the wound is not draining you may cover it with a dry dressing during the daytime.  Try to keep the wound dry and avoid ointments on the wound unless directed to do so.  If the wound becomes bright red and painful or starts to drain infected material that is not clear, please contact your physician immediately.  If the wound is mildly pink and has a thick firm ridge underneath it, this is normal, and is referred to as a healing ridge.  This will resolve over the next 4-6 weeks.  BATHING: You may shower if you have been informed of this by your surgeon. However, Please do not submerge in a tub, hot tub, or pool until incisions are completely sealed or have been told by your surgeon that you may do so.  DIET:  You may eat any foods that you can tolerate.  It is a good idea to eat a high fiber diet and take in plenty of fluids to prevent constipation.  If you do become constipated you may want to take a mild laxative or take ducolax tablets on a daily basis until your bowel habits are regular.  Constipation can be very uncomfortable, along with straining, after recent surgery.  ACTIVITY:  You are encouraged to cough and deep breath or use your incentive spirometer if you were given one, every 15-30 minutes when awake.  This will help prevent respiratory complications and low grade fevers post-operatively if you had a general anesthetic.  You may want to hug a pillow when coughing and sneezing to add additional support to the surgical area, if you had abdominal or chest surgery, which will decrease pain during these times.  You are encouraged to walk and engage in light activity for the next two weeks.  You  should not lift more than 20 pounds, until 06/04/2021 as it could put you at increased risk for complications.  Twenty pounds is roughly equivalent to a plastic bag of groceries. At that time- Listen to your body when lifting, if you have pain when lifting, stop and then try again in a few days. Soreness after doing exercises or activities of daily living is normal as you get back in to your normal routine.  MEDICATIONS:  Try to take narcotic medications and anti-inflammatory medications, such as tylenol, ibuprofen, naprosyn, etc., with food.  This will minimize stomach upset from the medication.  Should you develop nausea and vomiting from the pain medication, or develop a rash, please discontinue the medication and contact your physician.  You should not drive, make important decisions, or operate machinery when taking narcotic pain medication.  SUNBLOCK Use sun block to incision area over the next year if this area will be exposed to sun. This helps decrease scarring and will allow you avoid a permanent darkened area over your incision.  QUESTIONS:  Please feel free to call our office if you have any questions, and we will be glad to assist you. [phone]   

## 2021-06-03 ENCOUNTER — Encounter: Payer: Self-pay | Admitting: Surgery

## 2021-06-05 ENCOUNTER — Encounter: Payer: Self-pay | Admitting: Surgery

## 2021-06-05 ENCOUNTER — Ambulatory Visit (INDEPENDENT_AMBULATORY_CARE_PROVIDER_SITE_OTHER): Payer: Medicaid Other | Admitting: Surgery

## 2021-06-05 ENCOUNTER — Other Ambulatory Visit: Payer: Self-pay

## 2021-06-05 VITALS — BP 121/83 | HR 77 | Temp 98.6°F | Ht 66.0 in | Wt 202.4 lb

## 2021-06-05 DIAGNOSIS — Z8719 Personal history of other diseases of the digestive system: Secondary | ICD-10-CM

## 2021-06-05 DIAGNOSIS — Z9889 Other specified postprocedural states: Secondary | ICD-10-CM

## 2021-06-05 NOTE — Patient Instructions (Signed)

## 2021-06-05 NOTE — Progress Notes (Signed)
[facility] POST-OP OFFICE VISIT  06/05/2021  HPI: Marcia Bryant is a 35 y.o. female 6 weeks s/p Robotic umbilical hernia repair.  She notes the persistent diastasis which was expected, and reminded her of this.  Denies pain/tenderness and is ready to return to work.   Vital signs: BP 121/83   Pulse 77   Temp 98.6 F (37 C) (Oral)   Ht 5\' 6"  (1.676 m)   Wt 202 lb 6.4 oz (91.8 kg)   SpO2 97%   BMI 32.67 kg/m    Physical Exam: Constitutional: Appears well.   Abdomen: Soft, benign, supple without any distention.   Skin: umbilical skin "innie" in orientation.   Assessment/Plan: This is a 35 y.o. female 6 weeks s/p robotic UHR w/ mesh.   Residual diastasis recti.   Patient Active Problem List   Diagnosis Date Noted   Status post laparoscopic herniorrhaphy 05/01/2021   Diastasis recti 04/15/2021   Status post repeat low transverse cesarean section 02/18/2021   Gestational diabetes mellitus (GDM) affecting pregnancy, antepartum 12/04/2020   Placenta previa antepartum in second trimester 10/11/2020   History of cesarean delivery 08/07/2020   Back pain affecting pregnancy in first trimester 08/07/2020   Supervision of other normal pregnancy, antepartum 07/31/2020   Symptomatic mammary hypertrophy 09/20/2018   Chronic bilateral thoracic back pain 09/20/2018   Neck pain 09/20/2018   failed early 1h (154) 05/25/2016   BMI 33.0-33.9,adult 05/20/2016   Obesity in pregnancy 05/20/2016   History of pre-eclampsia 05/20/2016   History of shoulder dystocia in prior pregnancy 05/20/2016    - Release to resume work w//o restriction.    07/21/2016 M.D., FACS 06/05/2021, 1:54 PM

## 2021-07-07 ENCOUNTER — Ambulatory Visit (INDEPENDENT_AMBULATORY_CARE_PROVIDER_SITE_OTHER): Payer: Medicaid Other | Admitting: Advanced Practice Midwife

## 2021-07-07 ENCOUNTER — Other Ambulatory Visit (HOSPITAL_COMMUNITY)
Admission: RE | Admit: 2021-07-07 | Discharge: 2021-07-07 | Disposition: A | Discharge status: Home / Self Care | Payer: Medicaid Other | Source: Ambulatory Visit | Attending: Advanced Practice Midwife | Admitting: Advanced Practice Midwife

## 2021-07-07 ENCOUNTER — Encounter: Payer: Self-pay | Admitting: Advanced Practice Midwife

## 2021-07-07 ENCOUNTER — Other Ambulatory Visit: Payer: Self-pay

## 2021-07-07 VITALS — BP 125/82 | HR 85 | Ht 66.0 in | Wt 201.0 lb

## 2021-07-07 DIAGNOSIS — N898 Other specified noninflammatory disorders of vagina: Secondary | ICD-10-CM | POA: Insufficient documentation

## 2021-07-07 DIAGNOSIS — Z8632 Personal history of gestational diabetes: Secondary | ICD-10-CM | POA: Diagnosis not present

## 2021-07-07 MED ORDER — TERCONAZOLE 0.4 % VA CREA: 1.0000 | TOPICAL_CREAM | Freq: Every day | VAGINAL | 1 refills | Status: AC

## 2021-07-07 NOTE — Progress Notes (Signed)
Pt presents today with vaginal irritation and itching for approximately 3 weeks. States she has tried Monistat OTC with some relief but has now come back.

## 2021-07-07 NOTE — Progress Notes (Signed)
   GYNECOLOGY PROGRESS NOTE  History:  35 y.o. [id number] presents to [facility] office today for problem gyn visit. She reports vaginal itching with white discharge x 2 weeks.  She used Monostat 7 and her symptoms improved .  She denies h/a, dizziness, shortness of breath, n/v, or fever/chills.    The following portions of the patient's history were reviewed and updated as appropriate: allergies, current medications, past family history, past medical history, past social history, past surgical history and problem list. Last pap smear on 04/01/21 was normal, negative HRHPV.  Health Maintenance Due  Topic Date Due   HEMOGLOBIN A1C  Never done   PNEUMOCOCCAL POLYSACCHARIDE VACCINE AGE 66-64 HIGH RISK  Never done   FOOT EXAM  Never done   OPHTHALMOLOGY EXAM  Never done   URINE MICROALBUMIN  Never done   COVID-19 Vaccine (3 - Booster for Janssen series) 03/12/2021   INFLUENZA VACCINE  06/09/2021     Review of Systems:  Pertinent items are noted in HPI.   Objective:  Physical Exam Blood pressure 125/82, pulse 85, height 5\' 6"  (1.676 m), weight 201 lb (91.2 kg), last menstrual period 06/02/2021, not currently breastfeeding. VS reviewed, nursing note reviewed,  Constitutional: well developed, well nourished, no distress HEENT: normocephalic CV: normal rate Pulm/chest wall: normal effort Breast Exam: deferred Abdomen: soft Neuro: alert and oriented x 3 Skin: warm, dry Psych: affect normal Pelvic exam: Deferred  Assessment & Plan:  1. Vaginal itching --Symptoms c/w yeast - Cervicovaginal ancillary only( [place]) - terconazole (TERAZOL 7) 0.4 % vaginal cream; Place 1 applicator vaginally at bedtime.  Dispense: 45 g; Refill: 1   06/04/2021, CNM 3:48 PM

## 2021-07-08 ENCOUNTER — Other Ambulatory Visit: Payer: Self-pay | Admitting: Advanced Practice Midwife

## 2021-07-08 DIAGNOSIS — B9689 Other specified bacterial agents as the cause of diseases classified elsewhere: Secondary | ICD-10-CM

## 2021-07-08 LAB — CERVICOVAGINAL ANCILLARY ONLY
Bacterial Vaginitis (gardnerella): POSITIVE — AB
Candida Glabrata: NEGATIVE
Candida Vaginitis: NEGATIVE
Chlamydia: NEGATIVE
Comment: NEGATIVE
Comment: NEGATIVE
Comment: NEGATIVE
Comment: NEGATIVE
Comment: NEGATIVE
Comment: NORMAL
Neisseria Gonorrhea: NEGATIVE
Trichomonas: NEGATIVE

## 2021-07-08 MED ORDER — METRONIDAZOLE 500 MG PO TABS: 500.0000 mg | ORAL_TABLET | Freq: Two times a day (BID) | ORAL | 0 refills | Status: AC

## 2022-01-16 ENCOUNTER — Encounter (HOSPITAL_COMMUNITY): Payer: Self-pay | Admitting: Emergency Medicine

## 2022-01-16 ENCOUNTER — Other Ambulatory Visit: Payer: Self-pay

## 2022-01-16 ENCOUNTER — Emergency Department (HOSPITAL_COMMUNITY)
Admission: EM | Admit: 2022-01-16 | Discharge: 2022-01-16 | Disposition: A | Discharge status: Home / Self Care | Payer: Medicaid Other | Attending: Emergency Medicine | Admitting: Emergency Medicine

## 2022-01-16 DIAGNOSIS — R040 Epistaxis: Secondary | ICD-10-CM | POA: Diagnosis not present

## 2022-01-16 NOTE — Discharge Instructions (Signed)
You were seen today for concerns about an earlier nosebleed. As discussed, you currently have no bleed. I recommend treatment at home if the nosebleed reoccurs. Gently blow your nose to clear blood. Spray 3 sprays of Afrin per nostril. Hold pressure on the soft part of your nose just past the bony area for 15 minutes. Do not let go of pressure during this time. If the bleed doesn't stop you should seek further care at an urgent care or emergency department. Return to the emergency department immediately if you begin to experience chest pain, shortness of breath, or other life threatening conditions.  ?

## 2022-01-16 NOTE — ED Provider Notes (Signed)
?  [facility] ?Provider Note ? ? ?CSN: [id number] ?Arrival date & time: 01/16/22  1404 ? ?  ? ?History ? ?Chief Complaint  ?Patient presents with  ? Epistaxis  ? ? ?Marcia Bryant is a 36 y.o. female. The patient presents with concerns of a nosebleed. The bleed occurred this morning and stopped spontaneously. It also occurred after lunch and stopped on its own. No nosebleed at this time. No relevant patient history  ? ?HPI ? ?  ? ?Home Medications ?Prior to Admission medications   ?Medication Sig Start Date End Date Taking? Authorizing Provider  ?Multiple Vitamins-Minerals (WOMENS MULTIVITAMIN) TABS Take 1 tablet by mouth daily.    [provider]  ?terconazole (TERAZOL 7) 0.4 % vaginal cream Place 1 applicator vaginally at bedtime. 07/07/21   Leftwich-Kirby, Kathie Dike, CNM  ?   ? ?Allergies    ?Patient has no known allergies.   ? ?Review of Systems   ?Review of Systems  ?HENT:  Positive for nosebleeds.   ? ?Physical Exam ?Updated Vital Signs ?BP 137/82 (BP Location: Right Arm)   Pulse 70   Temp 99.2 ?F (37.3 ?C) (Oral)   Resp 15   SpO2 100%  ?Physical Exam ?Vitals reviewed.  ?Constitutional:   ?   General: She is not in acute distress. ?HENT:  ?   Head: Normocephalic and atraumatic.  ?   Nose: Nose normal.  ?   Right Nostril: No epistaxis or occlusion.  ?   Left Nostril: No epistaxis or occlusion.  ?Eyes:  ?   Conjunctiva/sclera: Conjunctivae normal.  ?Cardiovascular:  ?   Rate and Rhythm: Normal rate.  ?Pulmonary:  ?   Effort: Pulmonary effort is normal.  ?Musculoskeletal:  ?   Cervical back: Normal range of motion.  ?Skin: ?   General: Skin is warm and dry.  ?Neurological:  ?   Mental Status: She is alert.  ? ? ?ED Results / Procedures / Treatments   ?Labs ?(all labs ordered are listed, but only abnormal results are displayed) ?Labs Reviewed - No data to display ? ?EKG ?None ? ?Radiology ?No results found. ? ?Procedures ?Procedures  ? ? ?Medications Ordered in  ED ?Medications - No data to display ? ?ED Course/ Medical Decision Making/ A&P ?  ?                        ?Medical Decision Making ? ?The patient presents with concerns about a nosebleed. She currently has no bleed. She showed video of the nose bleed from earlier in the day. Self-resolved epistaxis. There is no further evaluation necessary. No treatment is required at this time. I explained to the patient the method for using Afrin and pressure to stop nose bleeds and the patient voiced understanding. If the patient is unable to stop a bleed at home she may return to the emergency department for further treatment and evaluation. Discharge home.  ? ? ?Final Clinical Impression(s) / ED Diagnoses ?Final diagnoses:  ?Epistaxis  ? ? ?Rx / DC Orders ?ED Discharge Orders   ? ? None  ? ?  ? ? ?  ?Dorothyann Peng, PA-C ?01/16/22 1436 ? ?  ?Lennice Sites, DO ?01/16/22 1438 ? ?

## 2022-01-16 NOTE — ED Triage Notes (Signed)
Patient coming from home, complaint of nosebleed and blood in mouth that occurred this morning and again this afternoon.  ?

## 2022-01-19 ENCOUNTER — Telehealth: Payer: Self-pay

## 2022-01-19 NOTE — Telephone Encounter (Signed)
Transition Care Management Unsuccessful Follow-up Telephone Call ? ?Date of discharge and from where:  01/16/2022 from [facility] ? ?Attempts:  1st Attempt ? ?Reason for unsuccessful TCM follow-up call:  Left voice message ? ? ? ?

## 2022-01-20 NOTE — Telephone Encounter (Signed)
Transition Care Management Unsuccessful Follow-up Telephone Call ? ?Date of discharge and from where:  01/16/2022-[facility]  ? ?Attempts:  2nd Attempt ? ?Reason for unsuccessful TCM follow-up call:  Left voice message ? ?  ?

## 2022-01-22 NOTE — Telephone Encounter (Signed)
Transition Care Management Follow-up Telephone Call ?Date of discharge and from where: 01/16/2022 from [facility] ?How have you been since you were released from the hospital? Patient stated that she is feeling okay and did not have any questions or concerns at this time.  ?Any questions or concerns? No ? ?Items Reviewed: ?Did the pt receive and understand the discharge instructions provided? Yes  ?Medications obtained and verified? Yes  ?Other? No  ?Any new allergies since your discharge? No  ?Dietary orders reviewed? No ?Do you have support at home? Yes  ? ?Functional Questionnaire: (I = Independent and D = Dependent) ?ADLs: I ? ?Bathing/Dressing- I ? ?Meal Prep- I ? ?Eating- I ? ?Maintaining continence- I ? ?Transferring/Ambulation- I ? ?Managing Meds- I ? ? ?Follow up appointments reviewed: ? ?PCP Hospital f/u appt confirmed? No  Patient stated that she does have a PCP but could not remember who it is.  ?Specialist Hospital f/u appt confirmed? No   ?Are transportation arrangements needed? No  ?If their condition worsens, is the pt aware to call PCP or go to the Emergency Dept.? Yes ?Was the patient provided with contact information for the PCP's office or ED? Yes ?Was to pt encouraged to call back with questions or concerns? Yes ? ?

## 2022-07-03 NOTE — Therapy (Signed)
OUTPATIENT PHYSICAL THERAPY THORACOLUMBAR EVALUATION   Patient Name: Marcia Bryant MRN: [id number] DOB:[birth year], 36 y.o., female Today's Date: 07/03/2022    Past Medical History:  Diagnosis Date   Bacterial vaginosis    Gestational diabetes    Preeclampsia 01/25/2013   Umbilical hernia 05/20/2016   Yeast infection    Past Surgical History:  Procedure Laterality Date   CESAREAN SECTION N/A 12/13/2016   Procedure: CESAREAN SECTION;  Surgeon: Adam Phenix, MD;  Location: [facility];  Service: Obstetrics;  Laterality: N/A;   CESAREAN SECTION WITH BILATERAL TUBAL LIGATION N/A 02/18/2021   Procedure: CESAREAN SECTION WITH BILATERAL TUBAL LIGATION;  Surgeon: Levie Heritage, DO;  Location: [facility];  Service: Obstetrics;  Laterality: N/A;   TUBAL LIGATION     Patient Active Problem List   Diagnosis Date Noted   History of gestational diabetes 07/07/2021   Status post laparoscopic herniorrhaphy 05/01/2021   Diastasis recti 04/15/2021   Status post repeat low transverse cesarean section 02/18/2021   History of cesarean delivery 08/07/2020   Symptomatic mammary hypertrophy 09/20/2018   Chronic bilateral thoracic back pain 09/20/2018   Neck pain 09/20/2018   BMI 33.0-33.9,adult 05/20/2016   History of pre-eclampsia 05/20/2016   History of shoulder dystocia in prior pregnancy 05/20/2016    PCP: NA  REFERRING PROVIDER: Eliezer Lofts, MD  REFERRING DIAG: Chronic back pain  Rationale for Evaluation and Treatment Rehabilitation  THERAPY DIAG:  No diagnosis found.  ONSET DATE: ***   SUBJECTIVE:           SUBJECTIVE STATEMENT: ***  PERTINENT HISTORY:  ***  PAIN:  Are you having pain? Yes:  NPRS scale: ***/10 Pain location: *** Pain description: *** Aggravating factors: *** Relieving factors: ***  PRECAUTIONS: None  WEIGHT BEARING RESTRICTIONS No  FALLS:  Has patient fallen in last 6 months? {fallsyesno:27318}  LIVING ENVIRONMENT: Lives  with: {[facility] lives with:25569::"lives with their family"} Lives in: {Lives in:25570} Stairs: {opstairs:27293} Has following equipment at home: {Assistive devices:23999}  OCCUPATION: ***  PLOF: Independent  PATIENT GOALS: ***   OBJECTIVE:  PATIENT SURVEYS:  Modified Oswestry ***   SCREENING FOR RED FLAGS: Negative  COGNITION: Overall cognitive status: Within functional limits for tasks assessed     SENSATION: WFL  MUSCLE LENGTH: ***  POSTURE:   ***  PALPATION: ***  LUMBAR ROM:   Active  A/PROM  eval  Flexion   Extension   Right lateral flexion   Left lateral flexion   Right rotation   Left rotation    LOWER EXTREMITY ROM:     {AROM/PROM:27142}  Right eval Left eval  Hip flexion    Hip extension    Hip abduction    Hip adduction    Hip internal rotation    Hip external rotation    Knee flexion    Knee extension    Ankle dorsiflexion    Ankle plantarflexion    Ankle inversion    Ankle eversion     LOWER EXTREMITY MMT:    MMT Right eval Left eval  Hip flexion    Hip extension    Hip abduction    Knee flexion    Knee extension    Ankle dorsiflexion    Ankle plantarflexion    Ankle inversion    Ankle eversion     LUMBAR SPECIAL TESTS:  {lumbar special test:25242}  FUNCTIONAL TESTS:  {Functional tests:24029}  GAIT: Distance walked: *** Assistive device utilized: {Assistive devices:23999} Level of assistance: {Levels of assistance:24026}  Comments: ***   TODAY'S TREATMENT  ***   PATIENT EDUCATION:  Education details: Exam findings, POC, HEP Person educated: Patient Education method: Explanation, Demonstration, Tactile cues, Verbal cues, and Handouts Education comprehension: verbalized understanding, returned demonstration, verbal cues required, tactile cues required, and needs further education  HOME EXERCISE PROGRAM: ***   ASSESSMENT: CLINICAL IMPRESSION: Patient is a 36 y.o. female who was seen today for physical  therapy evaluation and treatment for ***.    OBJECTIVE IMPAIRMENTS {opptimpairments:25111}.   ACTIVITY LIMITATIONS {activitylimitations:27494}  PARTICIPATION LIMITATIONS: {participationrestrictions:25113}  PERSONAL FACTORS {Personal factors:25162} are also affecting patient's functional outcome.   REHAB POTENTIAL: {rehabpotential:25112}  CLINICAL DECISION MAKING: {clinical decision making:25114}  EVALUATION COMPLEXITY: {Evaluation complexity:25115}   GOALS: Goals reviewed with patient? Yes  SHORT TERM GOALS: Target date: {follow up:25551}  Patient will be I with initial HEP in order to progress with therapy. Baseline: HEP provided at eval Goal status: INITIAL  2.  *** Baseline:  Goal status: INITIAL  3.  *** Baseline:  Goal status: INITIAL  LONG TERM GOALS: Target date: {follow up:25551}  Patient will be I with final HEP to maintain progress from PT. Baseline: HEP provided at eval Goal status: INITIAL  2.  Patient will report </= ***% modified ODI in order to indicate improved functional ability. Baseline: ***% Goal status: INITIAL  3.  *** Baseline:  Goal status: INITIAL  4.  *** Baseline:  Goal status: INITIAL   PLAN: PT FREQUENCY: {rehab frequency:25116}  PT DURATION: {rehab duration:25117}  PLANNED INTERVENTIONS: {rehab planned interventions:25118::"Therapeutic exercises","Therapeutic activity","Neuromuscular re-education","Balance training","Gait training","Patient/Family education","Self Care","Joint mobilization"}.  PLAN FOR NEXT SESSION: ***   Rosana Hoes, PT, DPT, LAT, ATC 07/03/22  11:49 AM Phone: [phone] Fax: [phone]

## 2022-07-04 ENCOUNTER — Ambulatory Visit: Payer: Medicaid Other | Attending: Adult Medicine | Admitting: Physical Therapy

## 2022-07-04 ENCOUNTER — Other Ambulatory Visit: Payer: Self-pay

## 2022-07-04 ENCOUNTER — Encounter: Payer: Self-pay | Admitting: Physical Therapy

## 2022-07-04 DIAGNOSIS — M5459 Other low back pain: Secondary | ICD-10-CM | POA: Diagnosis present

## 2022-07-04 DIAGNOSIS — M6281 Muscle weakness (generalized): Secondary | ICD-10-CM | POA: Insufficient documentation

## 2022-07-04 NOTE — Patient Instructions (Signed)
Access Code: [email or web address] URL: [email or web address] Date: 07/04/2022 Prepared by: Rosana Hoes  Exercises - Bridge  - 1 x daily - 2 sets - 10 reps - 5 seconds hold - Supine 90/90 Abdominal Bracing  - 1 x daily - 3 sets - 5 reps - 5 seconds hold - Clam with Resistance  - 1 x daily - 3 sets - 10 reps

## 2022-07-09 ENCOUNTER — Ambulatory Visit: Payer: Medicaid Other | Admitting: Physical Therapy

## 2022-07-09 ENCOUNTER — Encounter: Payer: Self-pay | Admitting: Physical Therapy

## 2022-07-09 DIAGNOSIS — M5459 Other low back pain: Secondary | ICD-10-CM | POA: Diagnosis not present

## 2022-07-09 DIAGNOSIS — M6281 Muscle weakness (generalized): Secondary | ICD-10-CM

## 2022-07-09 NOTE — Therapy (Addendum)
OUTPATIENT PHYSICAL THERAPY TREATMENT NOTE  DISCHARGE   Patient Name: Marcia Bryant MRN: [id number] DOB:16-Jun-1986, 36 y.o., female Today's Date: 07/09/2022  PCP: NA   REFERRING PROVIDER: Dulce Sellar, MD  END OF SESSION:   PT End of Session - 07/09/22 0848     Visit Number 2    Number of Visits 9    Date for PT Re-Evaluation 08/29/22    Authorization Type [facility]    Authorization - Number of Visits 27    PT Start Time 0845    PT Stop Time 0925    PT Time Calculation (min) 40 min             Past Medical History:  Diagnosis Date   Bacterial vaginosis    Gestational diabetes    Preeclampsia 32/67/1245   Umbilical hernia 80/99/8338   Yeast infection    Past Surgical History:  Procedure Laterality Date   CESAREAN SECTION N/A 12/13/2016   Procedure: CESAREAN SECTION;  Surgeon: Woodroe Mode, MD;  Location: [place];  Service: Obstetrics;  Laterality: N/A;   CESAREAN SECTION WITH BILATERAL TUBAL LIGATION N/A 02/18/2021   Procedure: CESAREAN SECTION WITH BILATERAL TUBAL LIGATION;  Surgeon: Truett Mainland, DO;  Location: [place] [facility];  Service: Obstetrics;  Laterality: N/A;   TUBAL LIGATION     Patient Active Problem List   Diagnosis Date Noted   History of gestational diabetes 07/07/2021   Status post laparoscopic herniorrhaphy 05/01/2021   Diastasis recti 04/15/2021   Status post repeat low transverse cesarean section 02/18/2021   History of cesarean delivery 08/07/2020   Symptomatic mammary hypertrophy 09/20/2018   Chronic bilateral thoracic back pain 09/20/2018   Neck pain 09/20/2018   BMI 33.0-33.9,adult 05/20/2016   History of pre-eclampsia 05/20/2016   History of shoulder dystocia in prior pregnancy 05/20/2016    REFERRING DIAG: REFERRING DIAG: Chronic back pain   THERAPY DIAG:  Muscle weakness (generalized)  Other low back pain  Rationale for Evaluation and Treatment Rehabilitation  PERTINENT HISTORY: Chronic back pain,  previous c-section and umbilical hernia repair 2505   PRECAUTIONS: None  SUBJECTIVE: I am trying to keep up with the exercises. No pain right now.   PAIN:  Are you having pain? Yes:  NPRS scale: 0/10 Pain location: Mid-lower back Pain description: "feel pain" Aggravating factors: Standing extended periods Relieving factors: Medication, sit and rest   OBJECTIVE: (objective measures completed at initial evaluation unless otherwise dated)   PATIENT SURVEYS:  Modified Oswestry 22    SCREENING FOR RED FLAGS: Negative   COGNITION: Overall cognitive status: Within functional limits for tasks assessed                          SENSATION: WFL   MUSCLE LENGTH: Hamstring and hip flexor limitations   POSTURE:            Rounded shoulders, increased lumbar lordosis   PALPATION: Mildly tender to palpation bilateral lumbar paraspinals   LUMBAR ROM:    Active  A/PROM  eval  Flexion 75%  Extension WFL  Right lateral flexion WFL  Left lateral flexion WFL  Right rotation WFL  Left rotation WFL    LOWER EXTREMITY ROM:                          Hip PROM grossly WFL and non-painful   LOWER EXTREMITY MMT:     Patient demonstrates difficulty  holding 90-90 table top position, able to hold < 5 seconds   MMT Right eval Left eval  Hip flexion 4- 4-  Hip extension 3+ 3+  Hip abduction 3+ 3+  Knee flexion 5 5  Knee extension 5 5    LUMBAR SPECIAL TESTS:  Radicular testing negative   FUNCTIONAL TESTS:  Patient demonstrates poor lifting technique with increased lumbar flexion and minimal hip hinge   GAIT: Assistive device utilized: None Level of assistance: Complete Independence Comments: Trendelenburg, bilateral toe in     TODAY'S TREATMENT   [facility] Adult PT Treatment:                                                DATE: 07/09/22 Therapeutic Exercise: Nustep L 5UE /LE x 5 minutes  STS x 10 with cues for hip hinge  Cat/Camel- mod cues and demo required Q ped with hip  extensions  S/L clam green 10 x 3 each Bridge with green band x 10 PPT- mod cues  PPT with green band clam x 10 SKTC 2 x 30 sec  LTR  90/90 table top - (1-2 sec hold)     INITIAL TREATMENT Bridge 10 x 5 sec 90-90 table top hold 5 x 5 sec Side clamshell with green x 10 each     PATIENT EDUCATION:  Education details: Exam findings, POC, HEP Person educated: Patient Education method: Explanation, Demonstration, Tactile cues, Verbal cues, and Handouts Education comprehension: verbalized understanding, returned demonstration, verbal cues required, tactile cues required, and needs further education   HOME EXERCISE PROGRAM: Access Code: [email or web address]     ASSESSMENT: CLINICAL IMPRESSION: Patient is a 36 y.o. female who was seen today for physical therapy  treatment for chronic mid-lower back pain.  She reports compliance with HEP. Reviewed HEP and progressed with hip hinge and lumbar stabilization. She tolerated session well without c/o pain. She did require repeated mod cues for correct exercise technique. She would benefit from continued skilled PT to progress her mobility, strength, and control in order to reduce pain and maximize functional ability.     OBJECTIVE IMPAIRMENTS decreased activity tolerance, decreased ROM, decreased strength, impaired flexibility, improper body mechanics, postural dysfunction, and pain.    ACTIVITY LIMITATIONS carrying, lifting, bending, standing, squatting, and locomotion level   PARTICIPATION LIMITATIONS: meal prep, cleaning, shopping, community activity, and occupation   PERSONAL FACTORS Fitness, Past/current experiences, Profession, and Time since onset of injury/illness/exacerbation are also affecting patient's functional outcome.    REHAB POTENTIAL: Good   CLINICAL DECISION MAKING: Stable/uncomplicated   EVALUATION COMPLEXITY: Low     GOALS: Goals reviewed with patient? Yes   SHORT TERM GOALS: Target date: 08/01/2022   Patient will be I  with initial HEP in order to progress with therapy. Baseline: HEP provided at eval Goal status: INITIAL   2.  Patient will report ability to stand >/= 20 minutes without increase in pain in order to improve work ability Baseline: patient report ability to stand 10 minutes before onset of pain Goal status: INITIAL   3.  Patient will report pain level </= 3/10 in order to reduce functional limitations Baseline: 6/10 pain at eval Goal status: INITIAL   LONG TERM GOALS: Target date: 08/29/2022   Patient will be I with final HEP to maintain progress from PT. Baseline: HEP provided at eval Goal status: INITIAL  2.  Patient will report </= 12% modified ODI in order to indicate improved functional ability. Baseline: 22% Goal status: INITIAL   3.  Patient will demonstrate improved hip and core strength >/= 4/5 MMT in order to improve standing and lifting ability Baseline: strength grossly 3+/5 MMT Goal status: INITIAL   4.  Patient will report ability to stand >/= 1 hour without limitation in order to improve work ability, cooking, and community access Baseline: limitations with standing > 10 minutes at eval Goal status: INITIAL     PLAN: PT FREQUENCY: 1x/week   PT DURATION: 8 weeks   PLANNED INTERVENTIONS: Therapeutic exercises, Therapeutic activity, Neuromuscular re-education, Balance training, Gait training, Patient/Family education, Self Care, Joint mobilization, Joint manipulation, Aquatic Therapy, Dry Needling, Electrical stimulation, Spinal manipulation, Spinal mobilization, Cryotherapy, Moist heat, Taping, Manual therapy, and Re-evaluation.   PLAN FOR NEXT SESSION: Review HEP and progress PRN, progress core and hip strength, initiate hip hinge / lifting mechanics and training, lumbar and hip strengthening PRN      Hessie Diener, PTA 07/09/22 9:07 AM Phone: [phone] Fax: [phone]       PHYSICAL THERAPY DISCHARGE SUMMARY  Visits from Start of Care:  2  Current functional level related to goals / functional outcomes: See above   Remaining deficits: See above   Education / Equipment: HEP   Patient agrees to discharge. Patient goals were not met. Patient is being discharged due to not returning since the last visit.  Hilda Blades, PT, DPT, LAT, ATC 09/25/22  10:21 AM Phone: [phone] Fax: [phone]

## 2022-07-18 ENCOUNTER — Ambulatory Visit: Payer: Medicaid Other | Attending: Adult Medicine | Admitting: Physical Therapy

## 2022-07-23 ENCOUNTER — Ambulatory Visit: Payer: Medicaid Other | Admitting: Physical Therapy

## 2022-07-23 ENCOUNTER — Encounter: Payer: Medicaid Other | Admitting: Physical Therapy

## 2022-08-01 ENCOUNTER — Ambulatory Visit: Payer: Medicaid Other

## 2022-10-25 IMAGING — US US MFM OB DETAIL+14 WK
1 series · 13 of 28 positions shown · non-contrast
Comparison: none

[Series 1: us mfm ob detail+14 wk · 118 acquisitions, 13 frames shown]
[im 5/118]
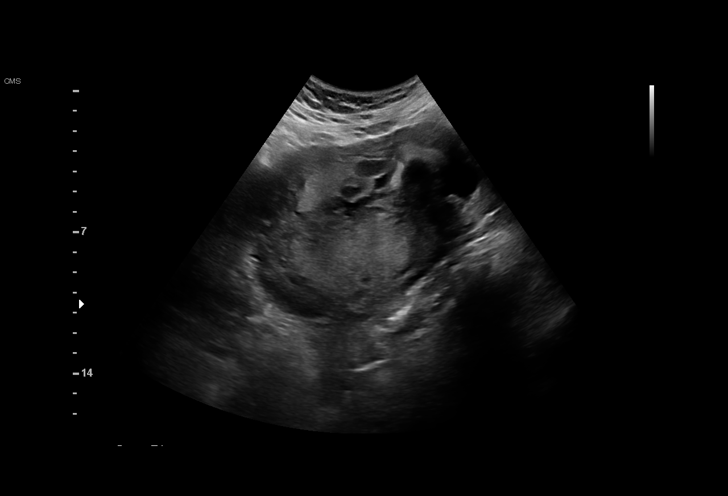
[im 14/118]
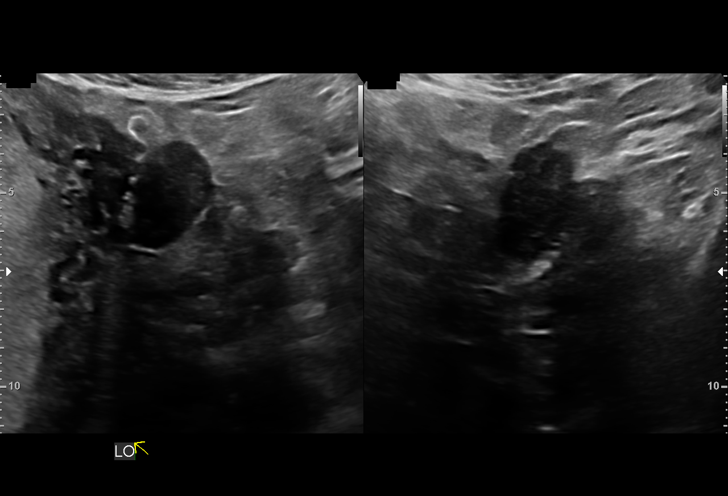
[im 22/118]
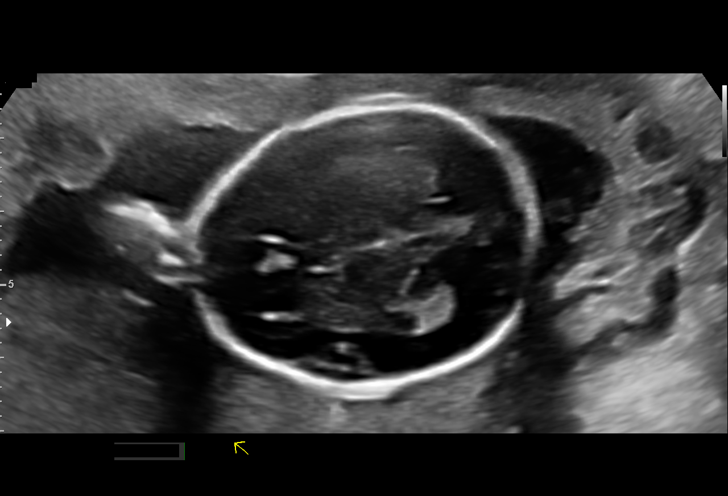
[im 31/118]
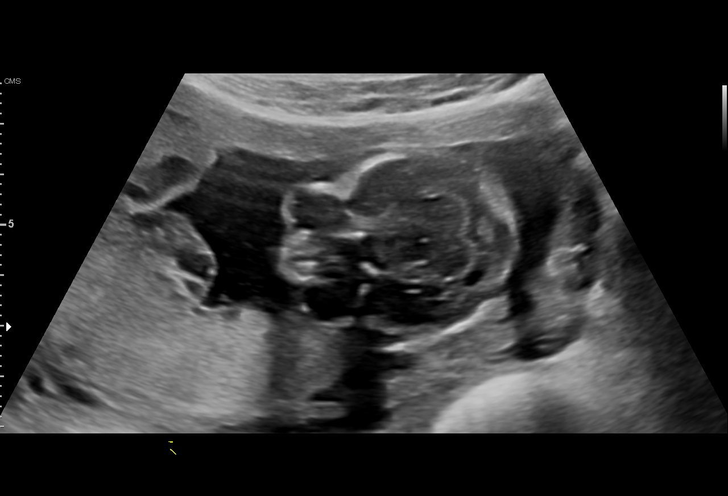
[im 40/118]
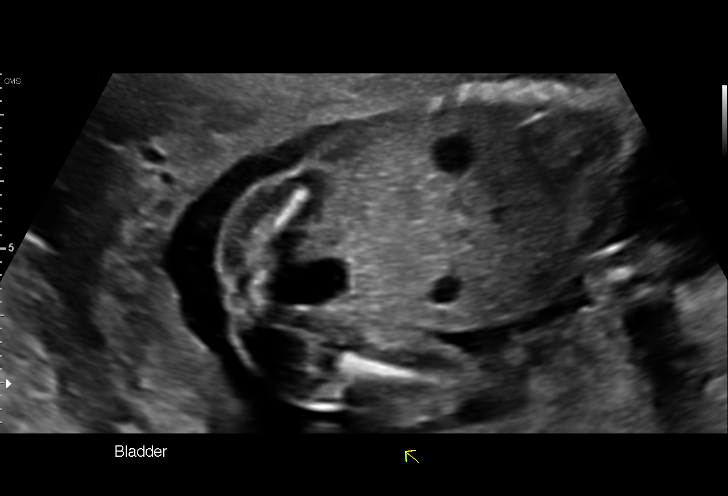
[im 48/118]
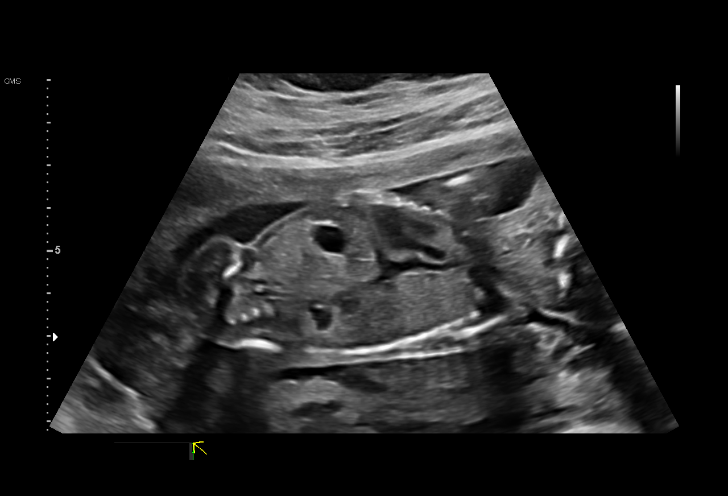
[im 61/118]
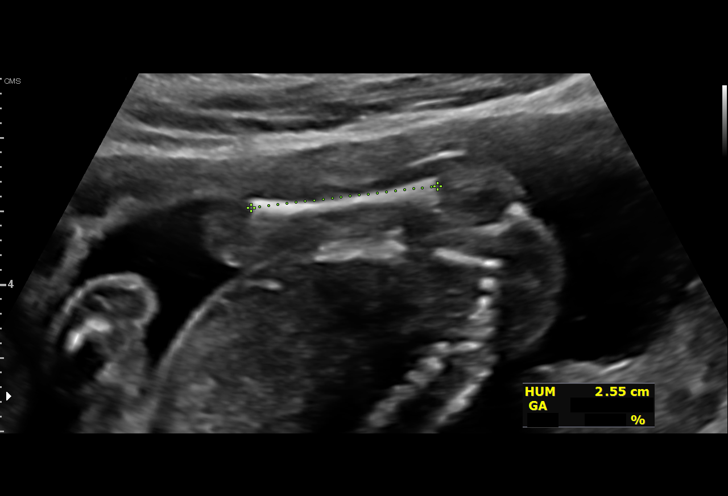
[im 70/118]
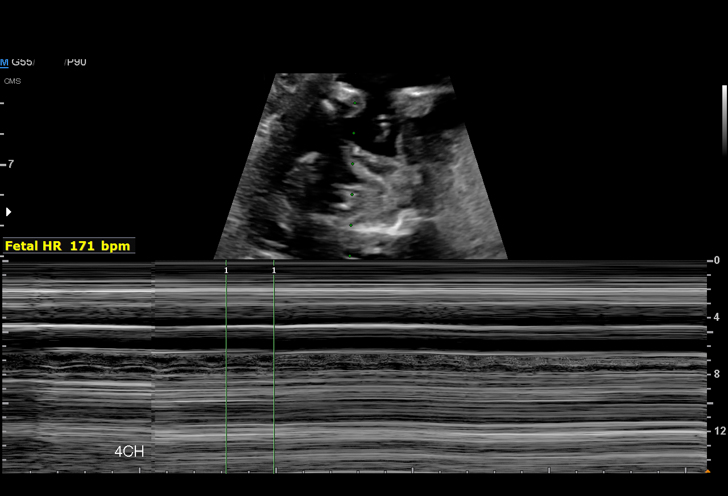
[im 79/118]
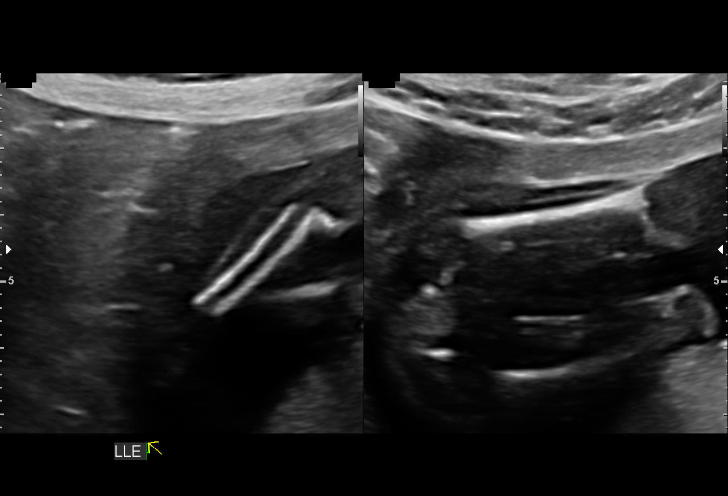
[im 87/118]
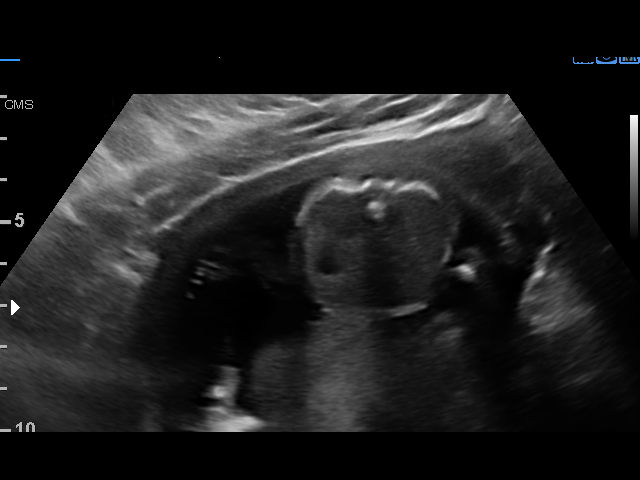
[im 96/118]
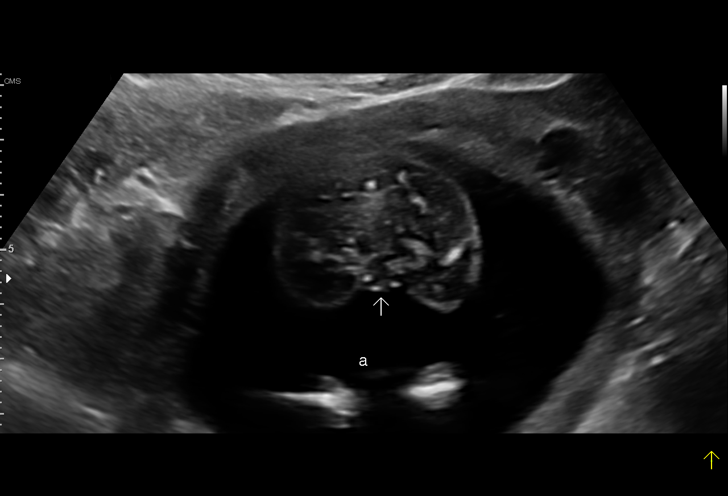
[im 105/118]
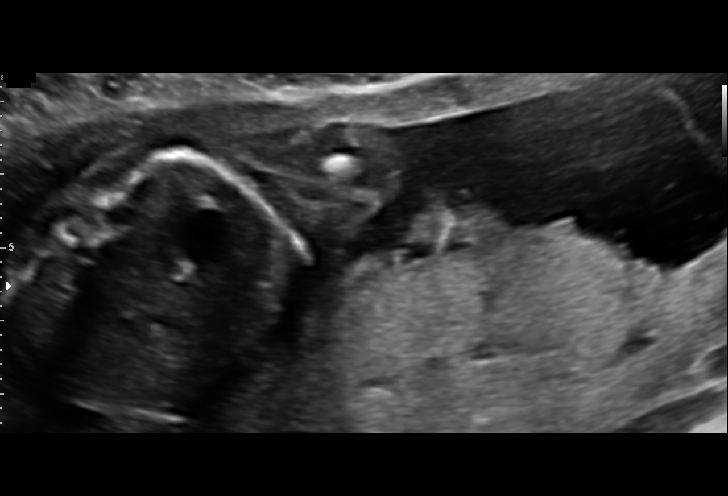
[im 113/118]
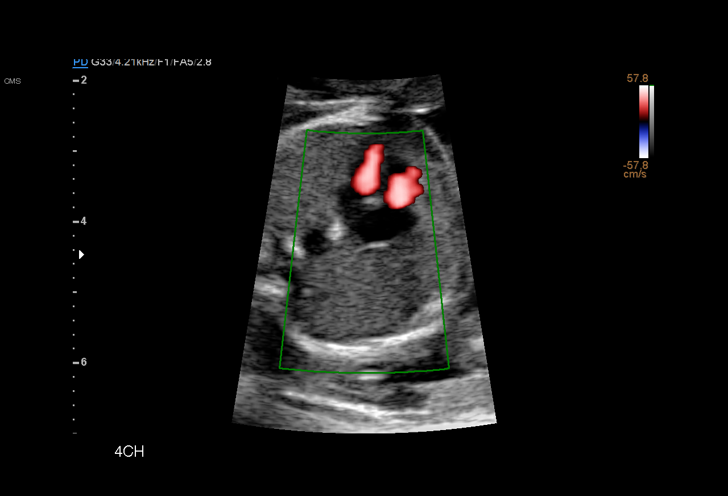

[13 of 28 positions shown; findings below may reference images not displayed]

Indications

 Antenatal screening for malformations
 Obesity complicating pregnancy, second
 trimester (pregravid BMI 32)
 Genetic carrier (silent Gaukme Calilov/MEHTA)
 History of cesarean delivery, currently
 pregnant x 1
 Poor obstetric history: Previous
 preeclampsia and shoulder dystocia
 Low risk NIPS, neg AFP
 Medical complication of pregnancy (umbilical
 hernia)
 18 weeks gestation of pregnancy
 Low lying placenta, antepartum
Fetal Evaluation

 Num Of Fetuses:         1
 Fetal Heart Rate(bpm):  171
 Cardiac Activity:       Observed
 Presentation:           Cephalic
 Placenta:               Posterior Previa
 P. Cord Insertion:      Visualized

 Amniotic Fluid
 AFI FV:      Within normal limits

                             Largest Pocket(cm)

Biometry

 BPD:      38.1  mm     G. Age:  17w 4d         27  %    CI:        73.95   %    70 - 86
                                                         FL/HC:      19.2   %    15.8 - 18
 HC:      140.7  mm     G. Age:  17w 3d         11  %    HC/AC:      1.13        1.07 -
 AC:      124.1  mm     G. Age:  18w 0d         44  %    FL/BPD:     70.9   %
 FL:         27  mm     G. Age:  18w 2d         48  %    FL/AC:      21.8   %    20 - 24
 HUM:        26  mm     G. Age:  18w 1d         56  %
 CER:        18  mm     G. Age:  17w 6d         40  %
 NFT:       4.0  mm

 LV:        6.9  mm
 CM:        3.7  mm

 Est. FW:     221  gm      0 lb 8 oz     38  %
OB History

 Gravidity:    5         Term:   3        Prem:   0        SAB:   1
 TOP:          0       Ectopic:  0        Living: 3
Gestational Age

 LMP:           19w 0d        Date:  05/14/20                 EDD:   02/18/21
 U/S Today:     17w 6d                                        EDD:   02/26/21
 Best:          18w 1d     Det. By:  Early Ultrasound         EDD:   02/24/21
                                     (07/01/20)
Anatomy

 Cranium:               Appears normal         LVOT:                   Appears normal
 Cavum:                 Appears normal         Aortic Arch:            Appears normal
 Ventricles:            Appears normal         Ductal Arch:            Appears normal
 Choroid Plexus:        Appears normal         Diaphragm:              Appears normal
 Cerebellum:            Appears normal         Stomach:                Appears normal, left
                                                                       sided
 Posterior Fossa:       Appears normal         Abdomen:                Appears normal
 Nuchal Fold:           Appears normal         Abdominal Wall:         Appears nml (cord
                                                                       insert, abd wall)
 Face:                  Orbits nl; profile not Cord Vessels:           Appears normal (3
                        well visualized                                vessel cord)
 Lips:                  Not well visualized    Kidneys:                Appear normal
 Palate:                Not well visualized    Bladder:                Appears normal
 Thoracic:              Appears normal         Spine:                  Appears normal
 Heart:                 Not well visualized    Upper Extremities:      Appears normal
 RVOT:                  Appears normal         Lower Extremities:      Appears normal

 Other:  Heels and open hands/5th digits visualized. Nasal bone visualized.
         Fetus appears to be female. Technically difficult due to maternal
         habitus (hernia). IVC/SVC visualized.
Cervix Uterus Adnexa

 Cervix
 Length:           4.11  cm.
 Normal appearance by transabdominal scan.
 Uterus
 No abnormality visualized.

 Right Ovary
 Within normal limits.

 Left Ovary
 Within normal limits.

 Cul De Sac
 No free fluid seen.

 Adnexa
 No abnormality visualized.
Impression

 G5 P3. Patient is here for fetal anatomy scan.
 On cell-free fetal DNA screening, the risks of fetal
 aneuploidies are not increased .MSAFP screening showed
 low risk for open-neural tube defects .

 Obstetric history is significant for 2 term deliveries and a
 cesarean delivery.  She has a history of shoulder dystocia.

 Patient has an increased carrier risk for Spinal Muscular
 Atrophy .

 We performed fetal anatomy scan. No makers of
 aneuploidies or fetal structural defects are seen. Fetal
 biometry is consistent with her previously-established dates.
 Amniotic fluid is normal and good fetal activity is seen.
 Patient understands the limitations of ultrasound in detecting
 fetal anomalies.
 Placenta is posterior and the placental edge is reaching
 internal os (previa). Patient does not give history of vaginal
 bleeding.
 I reassured the patient that we expect that placenta previa
 should resolve with advancing gestation. We will assess
 placental location in follow-up scans.
 I did not discuss carrier status today.
Recommendations

 -An appointment was made for her to return in 4 weeks for
 completion of fetal anatomy and placental location.
 -Recommend genetic counseling to discuss carrier status.
                 Jourdan, [place]
